# Patient Record
Sex: Female | Born: 1940 | Race: White | Hispanic: No | Marital: Married | Smoking: Former smoker
Health system: Southern US, Community
[De-identification: ages and names within clinical notes are randomized; demographics above are authoritative.]

## PROBLEM LIST (undated history)

## (undated) DIAGNOSIS — E785 Hyperlipidemia, unspecified: Secondary | ICD-10-CM

## (undated) DIAGNOSIS — Z90711 Acquired absence of uterus with remaining cervical stump: Secondary | ICD-10-CM

## (undated) DIAGNOSIS — E876 Hypokalemia: Secondary | ICD-10-CM

## (undated) DIAGNOSIS — K219 Gastro-esophageal reflux disease without esophagitis: Secondary | ICD-10-CM

## (undated) DIAGNOSIS — C801 Malignant (primary) neoplasm, unspecified: Secondary | ICD-10-CM

## (undated) DIAGNOSIS — N052 Unspecified nephritic syndrome with diffuse membranous glomerulonephritis: Secondary | ICD-10-CM

## (undated) DIAGNOSIS — Z973 Presence of spectacles and contact lenses: Secondary | ICD-10-CM

## (undated) DIAGNOSIS — N2581 Secondary hyperparathyroidism of renal origin: Secondary | ICD-10-CM

## (undated) DIAGNOSIS — N186 End stage renal disease: Secondary | ICD-10-CM

## (undated) DIAGNOSIS — E039 Hypothyroidism, unspecified: Secondary | ICD-10-CM

## (undated) DIAGNOSIS — D631 Anemia in chronic kidney disease: Secondary | ICD-10-CM

## (undated) DIAGNOSIS — J302 Other seasonal allergic rhinitis: Secondary | ICD-10-CM

## (undated) DIAGNOSIS — E875 Hyperkalemia: Secondary | ICD-10-CM

## (undated) DIAGNOSIS — R0600 Dyspnea, unspecified: Secondary | ICD-10-CM

## (undated) DIAGNOSIS — R809 Proteinuria, unspecified: Secondary | ICD-10-CM

## (undated) DIAGNOSIS — N189 Chronic kidney disease, unspecified: Secondary | ICD-10-CM

## (undated) DIAGNOSIS — M1990 Unspecified osteoarthritis, unspecified site: Secondary | ICD-10-CM

## (undated) DIAGNOSIS — E209 Hypoparathyroidism, unspecified: Secondary | ICD-10-CM

## (undated) DIAGNOSIS — I10 Essential (primary) hypertension: Secondary | ICD-10-CM

## (undated) DIAGNOSIS — R011 Cardiac murmur, unspecified: Secondary | ICD-10-CM

## (undated) HISTORY — PX: COLONOSCOPY: SHX174

## (undated) HISTORY — DX: Hypothyroidism, unspecified: E03.9

## (undated) HISTORY — PX: OTHER SURGICAL HISTORY: SHX169

## (undated) HISTORY — DX: Essential (primary) hypertension: I10

## (undated) HISTORY — DX: Secondary hyperparathyroidism of renal origin: N25.81

## (undated) HISTORY — DX: Chronic kidney disease, unspecified: N18.9

## (undated) HISTORY — DX: Hyperkalemia: E87.5

## (undated) HISTORY — DX: Proteinuria, unspecified: R80.9

## (undated) HISTORY — DX: Hyperlipidemia, unspecified: E78.5

## (undated) HISTORY — PX: SKIN BIOPSY: SHX1

## (undated) HISTORY — PX: ABDOMINAL HYSTERECTOMY: SHX81

## (undated) HISTORY — DX: Unspecified nephritic syndrome with diffuse membranous glomerulonephritis: N05.2

## (undated) HISTORY — DX: Other disorders of phosphorus metabolism: E83.39

## (undated) HISTORY — DX: Hypokalemia: E87.6

## (undated) HISTORY — DX: Hypoparathyroidism, unspecified: E20.9

## (undated) HISTORY — DX: Anemia in chronic kidney disease: D63.1

## (undated) HISTORY — DX: Acquired absence of uterus with remaining cervical stump: Z90.711

---

## 2000-07-02 ENCOUNTER — Inpatient Hospital Stay (HOSPITAL_COMMUNITY): Admission: AD | Admit: 2000-07-02 | Discharge: 2000-07-05 | Payer: Self-pay | Admitting: Nephrology

## 2000-07-02 ENCOUNTER — Encounter: Payer: Self-pay | Admitting: Nephrology

## 2000-07-04 ENCOUNTER — Encounter: Payer: Self-pay | Admitting: Nephrology

## 2000-08-15 ENCOUNTER — Ambulatory Visit (HOSPITAL_COMMUNITY): Admission: RE | Admit: 2000-08-15 | Discharge: 2000-08-15 | Payer: Self-pay | Admitting: Nephrology

## 2000-08-15 ENCOUNTER — Encounter: Payer: Self-pay | Admitting: Nephrology

## 2004-11-23 ENCOUNTER — Ambulatory Visit: Payer: Self-pay | Admitting: Family Medicine

## 2005-04-02 ENCOUNTER — Ambulatory Visit: Payer: Self-pay | Admitting: Family Medicine

## 2005-07-20 ENCOUNTER — Ambulatory Visit: Payer: Self-pay | Admitting: Family Medicine

## 2012-01-30 ENCOUNTER — Other Ambulatory Visit: Payer: Self-pay | Admitting: Nephrology

## 2012-01-30 ENCOUNTER — Ambulatory Visit
Admission: RE | Admit: 2012-01-30 | Discharge: 2012-01-30 | Disposition: A | Discharge status: Home / Self Care | Payer: Medicare Other | Source: Ambulatory Visit | Attending: Nephrology | Admitting: Nephrology

## 2012-01-30 DIAGNOSIS — R0602 Shortness of breath: Secondary | ICD-10-CM

## 2012-01-30 DIAGNOSIS — R05 Cough: Secondary | ICD-10-CM

## 2016-06-06 ENCOUNTER — Other Ambulatory Visit (HOSPITAL_COMMUNITY): Payer: Self-pay | Admitting: *Deleted

## 2016-06-07 ENCOUNTER — Inpatient Hospital Stay (HOSPITAL_COMMUNITY): Admission: RE | Admit: 2016-06-07 | Payer: Medicare Other | Source: Ambulatory Visit

## 2016-08-27 ENCOUNTER — Inpatient Hospital Stay (HOSPITAL_COMMUNITY): Admission: RE | Admit: 2016-08-27 | Payer: Medicare Other | Source: Ambulatory Visit

## 2016-11-26 DIAGNOSIS — Z85828 Personal history of other malignant neoplasm of skin: Secondary | ICD-10-CM | POA: Insufficient documentation

## 2016-11-26 DIAGNOSIS — I120 Hypertensive chronic kidney disease with stage 5 chronic kidney disease or end stage renal disease: Secondary | ICD-10-CM | POA: Insufficient documentation

## 2017-06-21 ENCOUNTER — Other Ambulatory Visit: Payer: Self-pay

## 2017-06-21 DIAGNOSIS — Z01810 Encounter for preprocedural cardiovascular examination: Secondary | ICD-10-CM

## 2017-06-21 DIAGNOSIS — N185 Chronic kidney disease, stage 5: Secondary | ICD-10-CM

## 2017-06-24 ENCOUNTER — Encounter: Payer: Self-pay | Admitting: Vascular Surgery

## 2017-08-01 ENCOUNTER — Other Ambulatory Visit: Payer: Self-pay | Admitting: *Deleted

## 2017-08-01 ENCOUNTER — Ambulatory Visit (HOSPITAL_COMMUNITY)
Admission: RE | Admit: 2017-08-01 | Discharge: 2017-08-01 | Disposition: A | Discharge status: Home / Self Care | Payer: Medicare Other | Source: Ambulatory Visit | Attending: Vascular Surgery | Admitting: Vascular Surgery

## 2017-08-01 ENCOUNTER — Ambulatory Visit (INDEPENDENT_AMBULATORY_CARE_PROVIDER_SITE_OTHER): Payer: Medicare Other | Admitting: Vascular Surgery

## 2017-08-01 ENCOUNTER — Encounter: Payer: Self-pay | Admitting: *Deleted

## 2017-08-01 ENCOUNTER — Ambulatory Visit (INDEPENDENT_AMBULATORY_CARE_PROVIDER_SITE_OTHER)
Admission: RE | Admit: 2017-08-01 | Discharge: 2017-08-01 | Disposition: A | Discharge status: Home / Self Care | Payer: Medicare Other | Source: Ambulatory Visit | Attending: Vascular Surgery | Admitting: Vascular Surgery

## 2017-08-01 ENCOUNTER — Encounter: Payer: Self-pay | Admitting: Vascular Surgery

## 2017-08-01 VITALS — BP 179/92 | HR 97 | Temp 98.3°F | Resp 18 | Ht 69.0 in | Wt 143.0 lb

## 2017-08-01 DIAGNOSIS — N184 Chronic kidney disease, stage 4 (severe): Secondary | ICD-10-CM

## 2017-08-01 DIAGNOSIS — N185 Chronic kidney disease, stage 5: Secondary | ICD-10-CM

## 2017-08-01 DIAGNOSIS — Z01810 Encounter for preprocedural cardiovascular examination: Secondary | ICD-10-CM | POA: Insufficient documentation

## 2017-08-01 NOTE — Progress Notes (Signed)
[facility] [place] HISTORY AND PHYSICAL  Referring: Dr. Mercy Moore History of Present Illness:  Patient is a 76 y.o. female who presents for placement of a permanent hemodialysis access. The patient is ambidextrous.  The patient is not currently on hemodialysis.  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  Other chronic medical problems include anemia, hyperlipidemia, hypertension, secondary hyperparathyroidism all of which have been stable.  Past Medical History:  Diagnosis Date  . Anemia of renal disease   . Chronic kidney disease    Stage IV  . Hyperkalemia   . Hyperlipidemia   . Hyperphosphatemia   . Hypertension    Essential with goal blood pressure less than 130/80  . Hypokalemia   . Hypoparathyroidism ([place])   . Hypothyroidism   . Membranous glomerulonephritis   . Proteinuria   . S/P partial hysterectomy   . Secondary hyperparathyroidism, renal [facility])     Past Surgical History:  Procedure Laterality Date  . ABDOMINAL HYSTERECTOMY     PARTIAL  . SKIN BIOPSY      SKIN CANCER 6 AREAS  . [place]       Social History Social History  Substance Use Topics  . Smoking status: Former Research scientist (life sciences)  . Smokeless tobacco: Never Used  . Alcohol use No    Family History Family History  Problem Relation Age of Onset  . Stroke Mother   . Heart attack Father     Allergies  No Known Allergies   Current Outpatient Prescriptions  Medication Sig Dispense Refill  . acetaminophen (TYLENOL) 325 MG tablet Take 650 mg by mouth every 6 (six) hours as needed (for pain.).     Marland Kitchen amLODipine (NORVASC) 10 MG tablet Take 10 mg by mouth daily with breakfast.     . atorvastatin (LIPITOR) 80 MG tablet Take 80 mg by mouth daily with breakfast.     . calcitRIOL (ROCALTROL) 0.25 MCG capsule Take 0.5 mcg by mouth daily with breakfast.     . Darbepoetin Alfa (ARANESP) 200 MCG/0.4ML SOSY injection Inject  200 mcg into the skin every 14 (fourteen) days.     . furosemide (LASIX) 80 MG tablet Take 80 mg by mouth daily before breakfast.     . levothyroxine (SYNTHROID, LEVOTHROID) 75 MCG tablet Take 75 mcg by mouth daily before breakfast.    . loratadine (CLARITIN) 10 MG tablet Take 10 mg by mouth daily with breakfast.     . omeprazole (PRILOSEC) 20 MG capsule Take 20 mg by mouth daily before breakfast.     . raloxifene (EVISTA) 60 MG tablet Take 60 mg by mouth daily with breakfast.     . sodium bicarbonate 650 MG tablet Take 650 mg by mouth 3 (three) times daily.     . carvedilol (COREG) 6.25 MG tablet Take 6.25 mg by mouth 2 (two) times daily.     No current facility-administered medications for this visit.     ROS:   General:  No weight loss, Fever, chills  HEENT: No recent headaches, no nasal bleeding, no visual changes, no sore throat  Neurologic: No dizziness, blackouts, seizures. No recent symptoms of stroke or mini- stroke. No recent episodes of slurred speech, or temporary blindness.  Cardiac: No recent episodes of chest pain/pressure, no shortness of breath at rest.  No shortness of breath with exertion.  Denies history of atrial fibrillation or irregular heartbeat  Vascular: No history of rest pain in feet.  No history of claudication.  No history of non-healing ulcer, No history of DVT   Pulmonary: No home oxygen, no productive cough, no hemoptysis,  No asthma or wheezing  Musculoskeletal:  [ ]  Arthritis, [ ]  Low back pain,  [ ]  Joint pain  Hematologic:No history of hypercoagulable state.  No history of easy bleeding.  No history of anemia  Gastrointestinal: No hematochezia or melena,  No gastroesophageal reflux, no trouble swallowing  Urinary: [x ] chronic Kidney disease, [ ]  on HD - [ ]  MWF or [ ]  TTHS, [ ]  Burning with urination, [ ]  Frequent urination, [ ]  Difficulty urinating;   Skin: No rashes  Psychological: No history of anxiety,  No history of  depression   Physical Examination  Vitals:   08/01/17 0847  BP: (!) 179/92  Pulse: 97  Resp: 18  Temp: 98.3 F (36.8 C)  SpO2: 100%  Weight: 143 lb (64.9 kg)  Height: 5\' 9"  (1.753 m)    Body mass index is 21.12 kg/m.  General:  Alert and oriented, no acute distress HEENT: Normal Neck: No bruit or JVD Pulmonary: Clear to auscultation bilaterally Cardiac: Regular Rate and Rhythm without murmur Skin: No rash Extremity Pulses:  2+ radial, brachial pulses bilaterally Musculoskeletal: No deformity or edema  Neurologic: Upper and lower extremity motor 5/5 and symmetric  DATA: She had a vein mapping ultrasound today. Her veins were not a very high quality. She did have a greater than 2 mm cephalic vein in the right upper arm. Otherwise the cephalic vein in the left and right arm was not usable. The basilic vein was also small less than 2 mm on the right 2-3 mm on the left  I reviewed and interpreted this study.   ASSESSMENT: Patient needs long-term hemodialysis access. She has a small vein in the right arm but this may be adequate for fistula creation. I also discussed with her that this may not mature. If the fistula does not mature we would defer placing an AV graft until she approached near dialysis. Other risks benefits and possible complications of the procedure including not limited to bleeding infection non-maturation of the fistula ischemic steal were discussed the patient her daughter and her husband today. They understand and agree to proceed. We have scheduled her for a right brachiocephalic AV fistula placement on Tuesday, 08/06/2017.  PLAN:  See above  Ruta Hinds, MD [facility] [place] Office: [phone] Pager: [phone]

## 2017-08-05 ENCOUNTER — Encounter (HOSPITAL_COMMUNITY): Payer: Self-pay | Admitting: *Deleted

## 2017-08-05 MED ORDER — CEFUROXIME SODIUM 1.5 G IV SOLR
1.5000 g | INTRAVENOUS | Status: AC
  Administered 2017-08-06: 1.5 g via INTRAVENOUS
  Filled 2017-08-05: qty 1.5

## 2017-08-05 NOTE — Progress Notes (Signed)
Pt denies SOB, chest pain, and being under the care of a cardiologist. Pt denies having a stress test, echo and cardiac cath. Pt denies having a chest x ray and EKG within the last year. Pt made aware to stop taking vitamins, fish oil and herbal medications. Do not take any NSAIDs ie: Ibuprofen, Advil, Naproxen (Aleve), Motrin, BC and Goody Powder the patient verbalized understanding of all pre-op instructions.

## 2017-08-06 ENCOUNTER — Ambulatory Visit (HOSPITAL_COMMUNITY): Payer: Medicare Other | Admitting: Anesthesiology

## 2017-08-06 ENCOUNTER — Ambulatory Visit (HOSPITAL_COMMUNITY)
Admission: RE | Admit: 2017-08-06 | Discharge: 2017-08-06 | Disposition: A | Discharge status: Home / Self Care | Payer: Medicare Other | Source: Ambulatory Visit | Attending: Vascular Surgery | Admitting: Vascular Surgery

## 2017-08-06 ENCOUNTER — Encounter (HOSPITAL_COMMUNITY)
Admission: RE | Disposition: A | Discharge status: Home / Self Care | Payer: Self-pay | Source: Ambulatory Visit | Attending: Vascular Surgery

## 2017-08-06 ENCOUNTER — Telehealth: Payer: Self-pay | Admitting: Vascular Surgery

## 2017-08-06 ENCOUNTER — Encounter (HOSPITAL_COMMUNITY): Payer: Self-pay | Admitting: Anesthesiology

## 2017-08-06 DIAGNOSIS — N2581 Secondary hyperparathyroidism of renal origin: Secondary | ICD-10-CM | POA: Insufficient documentation

## 2017-08-06 DIAGNOSIS — Z87891 Personal history of nicotine dependence: Secondary | ICD-10-CM | POA: Insufficient documentation

## 2017-08-06 DIAGNOSIS — N184 Chronic kidney disease, stage 4 (severe): Secondary | ICD-10-CM | POA: Diagnosis not present

## 2017-08-06 DIAGNOSIS — D631 Anemia in chronic kidney disease: Secondary | ICD-10-CM | POA: Diagnosis not present

## 2017-08-06 DIAGNOSIS — N185 Chronic kidney disease, stage 5: Secondary | ICD-10-CM

## 2017-08-06 DIAGNOSIS — Z85828 Personal history of other malignant neoplasm of skin: Secondary | ICD-10-CM | POA: Insufficient documentation

## 2017-08-06 DIAGNOSIS — E039 Hypothyroidism, unspecified: Secondary | ICD-10-CM | POA: Diagnosis not present

## 2017-08-06 DIAGNOSIS — Z79899 Other long term (current) drug therapy: Secondary | ICD-10-CM | POA: Insufficient documentation

## 2017-08-06 DIAGNOSIS — D649 Anemia, unspecified: Secondary | ICD-10-CM | POA: Insufficient documentation

## 2017-08-06 DIAGNOSIS — I129 Hypertensive chronic kidney disease with stage 1 through stage 4 chronic kidney disease, or unspecified chronic kidney disease: Secondary | ICD-10-CM | POA: Diagnosis not present

## 2017-08-06 DIAGNOSIS — E785 Hyperlipidemia, unspecified: Secondary | ICD-10-CM | POA: Diagnosis not present

## 2017-08-06 DIAGNOSIS — Z90710 Acquired absence of both cervix and uterus: Secondary | ICD-10-CM | POA: Insufficient documentation

## 2017-08-06 DIAGNOSIS — K219 Gastro-esophageal reflux disease without esophagitis: Secondary | ICD-10-CM | POA: Diagnosis not present

## 2017-08-06 HISTORY — DX: Other seasonal allergic rhinitis: J30.2

## 2017-08-06 HISTORY — DX: Malignant (primary) neoplasm, unspecified: C80.1

## 2017-08-06 HISTORY — PX: AV FISTULA PLACEMENT: SHX1204

## 2017-08-06 HISTORY — DX: Gastro-esophageal reflux disease without esophagitis: K21.9

## 2017-08-06 LAB — POCT I-STAT 4, (NA,K, GLUC, HGB,HCT)
GLUCOSE: 106 mg/dL — AB (ref 65–99)
HEMATOCRIT: 23 % — AB (ref 36.0–46.0)
HEMOGLOBIN: 7.8 g/dL — AB (ref 12.0–15.0)
Potassium: 3.6 mmol/L (ref 3.5–5.1)
Sodium: 144 mmol/L (ref 135–145)

## 2017-08-06 SURGERY — ARTERIOVENOUS (AV) FISTULA CREATION
Anesthesia: Monitor Anesthesia Care | Site: Arm Upper | Laterality: Right

## 2017-08-06 MED ORDER — 0.9 % SODIUM CHLORIDE (POUR BTL) OPTIME
TOPICAL | Status: DC | PRN
  Administered 2017-08-06: 1000 mL

## 2017-08-06 MED ORDER — CHLORHEXIDINE GLUCONATE 4 % EX LIQD: 60.0000 mL | Freq: Once | CUTANEOUS | Status: DC

## 2017-08-06 MED ORDER — SODIUM CHLORIDE 0.9 % IV SOLN
INTRAVENOUS | Status: DC | PRN
  Administered 2017-08-06: 08:00:00 500 mL

## 2017-08-06 MED ORDER — FENTANYL CITRATE (PF) 250 MCG/5ML IJ SOLN
INTRAMUSCULAR | Status: AC
Start: 2017-08-06 — End: 2017-08-06
  Filled 2017-08-06: qty 5

## 2017-08-06 MED ORDER — SODIUM CHLORIDE 0.9 % IV SOLN
INTRAVENOUS | Status: DC | PRN
  Administered 2017-08-06: 07:00:00 via INTRAVENOUS

## 2017-08-06 MED ORDER — PROPOFOL 500 MG/50ML IV EMUL
INTRAVENOUS | Status: DC | PRN
  Administered 2017-08-06: 75 ug/kg/min via INTRAVENOUS

## 2017-08-06 MED ORDER — HEPARIN SODIUM (PORCINE) 1000 UNIT/ML IJ SOLN
INTRAMUSCULAR | Status: DC | PRN
  Administered 2017-08-06: 3000 [IU] via INTRAVENOUS

## 2017-08-06 MED ORDER — OXYCODONE HCL 5 MG PO TABS: 5.0000 mg | ORAL_TABLET | Freq: Four times a day (QID) | ORAL | 0 refills | Status: DC | PRN

## 2017-08-06 MED ORDER — MIDAZOLAM HCL 2 MG/2ML IJ SOLN
INTRAMUSCULAR | Status: AC
  Filled 2017-08-06: qty 2

## 2017-08-06 MED ORDER — PROPOFOL 10 MG/ML IV BOLUS
INTRAVENOUS | Status: AC
  Filled 2017-08-06: qty 20

## 2017-08-06 MED ORDER — ONDANSETRON HCL 4 MG/2ML IJ SOLN
INTRAMUSCULAR | Status: DC | PRN
  Administered 2017-08-06: 4 mg via INTRAVENOUS

## 2017-08-06 MED ORDER — LIDOCAINE HCL (PF) 1 % IJ SOLN
INTRAMUSCULAR | Status: DC | PRN
  Administered 2017-08-06: 15 mL

## 2017-08-06 MED ORDER — FENTANYL CITRATE (PF) 100 MCG/2ML IJ SOLN
INTRAMUSCULAR | Status: DC | PRN
  Administered 2017-08-06: 50 ug via INTRAVENOUS

## 2017-08-06 MED ORDER — PHENYLEPHRINE HCL 10 MG/ML IJ SOLN
INTRAMUSCULAR | Status: DC | PRN
  Administered 2017-08-06: 20 ug/min via INTRAVENOUS

## 2017-08-06 SURGICAL SUPPLY — 32 items
ADH SKN CLS APL DERMABOND .7 (GAUZE/BANDAGES/DRESSINGS) ×1
ARMBAND PINK RESTRICT EXTREMIT (MISCELLANEOUS) ×4 IMPLANT
CANISTER SUCT 3000ML PPV (MISCELLANEOUS) ×2 IMPLANT
CANNULA VESSEL 3MM 2 BLNT TIP (CANNULA) ×2 IMPLANT
CLIP VESOCCLUDE MED 6/CT (CLIP) ×2 IMPLANT
CLIP VESOCCLUDE SM WIDE 6/CT (CLIP) ×2 IMPLANT
COVER PROBE W GEL 5X96 (DRAPES) IMPLANT
DECANTER SPIKE VIAL GLASS SM (MISCELLANEOUS) ×2 IMPLANT
DERMABOND ADVANCED (GAUZE/BANDAGES/DRESSINGS) ×1
DERMABOND ADVANCED .7 DNX12 (GAUZE/BANDAGES/DRESSINGS) ×1 IMPLANT
DRAIN PENROSE 1/4X12 LTX STRL (WOUND CARE) ×1 IMPLANT
ELECT REM PT RETURN 9FT ADLT (ELECTROSURGICAL) ×2
ELECTRODE REM PT RTRN 9FT ADLT (ELECTROSURGICAL) ×1 IMPLANT
GLOVE BIO SURGEON STRL SZ 6.5 (GLOVE) ×4 IMPLANT
GLOVE BIO SURGEON STRL SZ7.5 (GLOVE) ×2 IMPLANT
GOWN STRL REUS W/ TWL LRG LVL3 (GOWN DISPOSABLE) ×3 IMPLANT
GOWN STRL REUS W/TWL LRG LVL3 (GOWN DISPOSABLE) ×6
KIT BASIN OR (CUSTOM PROCEDURE TRAY) ×2 IMPLANT
KIT ROOM TURNOVER OR (KITS) ×2 IMPLANT
LOOP VESSEL MINI RED (MISCELLANEOUS) ×1 IMPLANT
NDL HYPO 25GX1X1/2 BEV (NEEDLE) IMPLANT
NEEDLE HYPO 25GX1X1/2 BEV (NEEDLE) ×2 IMPLANT
NS IRRIG 1000ML POUR BTL (IV SOLUTION) ×2 IMPLANT
PACK CV ACCESS (CUSTOM PROCEDURE TRAY) ×2 IMPLANT
PAD ARMBOARD 7.5X6 YLW CONV (MISCELLANEOUS) ×4 IMPLANT
SPONGE SURGIFOAM ABS GEL 100 (HEMOSTASIS) IMPLANT
SUT PROLENE 7 0 BV 1 (SUTURE) ×2 IMPLANT
SUT VIC AB 3-0 SH 27 (SUTURE) ×2
SUT VIC AB 3-0 SH 27X BRD (SUTURE) ×1 IMPLANT
SUT VICRYL 4-0 PS2 18IN ABS (SUTURE) ×2 IMPLANT
UNDERPAD 30X30 (UNDERPADS AND DIAPERS) ×2 IMPLANT
WATER STERILE IRR 1000ML POUR (IV SOLUTION) ×2 IMPLANT

## 2017-08-06 NOTE — Telephone Encounter (Signed)
-----   Message from Mena Goes, RN sent at 08/06/2017  9:34 AM EDT ----- Regarding: 4 weeks w/ duplex   ----- Message ----- From: Gabriel Earing, PA-C Sent: 08/06/2017   8:48 AM To: Vvs Charge Pool  S/p right BC AVF.  F/u with Dr. Oneida Alar in 4 weeks with duplex.  Thanks!

## 2017-08-06 NOTE — Op Note (Signed)
Procedure: Right Brachial Cephalic AV fistula  Preop: ESRD  Postop: ESRD  Anesthesia: Local with sedation  Assistant: Leontine Locket PA-C  Findings: 2.5 mm cephalic vein  Procedure: After obtaining informed consent, the patient was taken to the operating room.  After induction of general anesthesia, the left upper extremity was prepped and draped in usual sterile fashion.  A transverse incision was then made near the antecubital crease the left arm. The incision was carried into the subcutaneous tissues down to level of the cephalic vein. The cephalic vein was approximately 2.5 mm in diameter. It was of good quality. This was dissected free circumferentially and small side branches ligated and divided between silk ties or clips. Next the brachial artery was dissected free in the medial portion of the incision. The artery was  3-4 mm in diameter. The vessel loops were placed proximal and distal to the planned site of arteriotomy. The patient was given 3000 units of intravenous heparin. After appropriate circulation time, the vessel loops were used to control the artery. A longitudinal opening was made in the brachial artery.  The vein was ligated distally with a 2-0 silk tie. The vein was controlled proximally with a fine bulldog clamp. The vein was then swung over to the artery and sewn end of vein to side of artery using a running 7-0 Prolene suture. Just prior to completion of the anastomosis, everything was fore bled back bled and thoroughly flushed. The anastomosis was secured, vessel loops released, and there was a palpable thrill in the fistula immediately. After hemostasis was obtained, the subcutaneous tissues were reapproximated using a running 3-0 Vicryl suture. The skin was then closed with a 4 Vicryl subcuticular stitch. Dermabond was applied to the skin incision.  The patient had a palpable radial pulse at the end of the case.  The fistula was slightly pulsatile with good doppler flow to  the level of the shoulder.  Ruta Hinds, MD [facility] [place] Office: [phone] Pager: [phone]

## 2017-08-06 NOTE — Anesthesia Postprocedure Evaluation (Signed)
Anesthesia Post Note  Patient: Latoya Lopez  Procedure(s) Performed: Procedure(s) (LRB): ARTERIOVENOUS (AV) FISTULA CREATION RIGHT ARM (Right)     Patient location during evaluation: PACU Anesthesia Type: MAC Level of consciousness: awake and alert Pain management: pain level controlled Vital Signs Assessment: post-procedure vital signs reviewed and stable Respiratory status: spontaneous breathing, nonlabored ventilation, respiratory function stable and patient connected to nasal cannula oxygen Cardiovascular status: stable and blood pressure returned to baseline Postop Assessment: no apparent nausea or vomiting Anesthetic complications: no    Last Vitals:  Vitals:   08/06/17 1007 08/06/17 1015  BP:  (!) 142/70  Pulse:  96  Resp:  18  Temp:    SpO2: 98% 94%    Last Pain:  Vitals:   08/06/17 1015  TempSrc:   PainSc: 0-No pain                 Dynisha Due,JAMES TERRILL

## 2017-08-06 NOTE — Transfer of Care (Signed)
Immediate Anesthesia Transfer of Care Note  Patient: Latoya Lopez  Procedure(s) Performed: Procedure(s): ARTERIOVENOUS (AV) FISTULA CREATION RIGHT ARM (Right)  Patient Location: PACU  Anesthesia Type:MAC  Level of Consciousness: awake, alert  and oriented  Airway & Oxygen Therapy: Patient Spontanous Breathing and Patient connected to nasal cannula oxygen  Post-op Assessment: Report given to RN, Post -op Vital signs reviewed and stable and Patient moving all extremities X 4  Post vital signs: Reviewed and stable  Last Vitals:  Vitals:   08/06/17 0605 08/06/17 0859  BP: (!) 154/65   Pulse: 95   Resp: 18   Temp: 36.8 C (!) (P) 36.2 C  SpO2: 95%     Last Pain:  Vitals:   08/06/17 0640  TempSrc:   PainSc: 2       Patients Stated Pain Goal: 2 (13/88/71 9597)  Complications: No apparent anesthesia complications

## 2017-08-06 NOTE — Interval H&P Note (Signed)
History and Physical Interval Note:  08/06/2017 7:28 AM  Latoya Lopez  has presented today for surgery, with the diagnosis of chronic kidney disease stage five  The various methods of treatment have been discussed with the patient and family. After consideration of risks, benefits and other options for treatment, the patient has consented to  Procedure(s): ARTERIOVENOUS (AV) FISTULA CREATION RIGHT ARM (Right) as a surgical intervention .  [name] history has been reviewed, patient examined, no change in status, stable for surgery.  I have reviewed the patient's chart and labs.  Questions were answered to the patient's satisfaction.     Ruta Hinds

## 2017-08-06 NOTE — Anesthesia Procedure Notes (Signed)
Procedure Name: MAC Date/Time: 08/06/2017 7:43 AM Performed by: Neldon Newport Pre-anesthesia Checklist: Timeout performed, Patient being monitored, Suction available, Emergency Drugs available and Patient identified Patient Re-evaluated:Patient Re-evaluated prior to induction Oxygen Delivery Method: Simple face mask

## 2017-08-06 NOTE — Anesthesia Preprocedure Evaluation (Addendum)
Anesthesia Evaluation  Patient identified by MRN, date of birth, ID band Patient awake    Reviewed: Allergy & Precautions, NPO status , Patient's Chart, lab work & pertinent test results  Airway Mallampati: II  TM Distance: <3 FB Neck ROM: Full    Dental  (+) Edentulous Upper, Dental Advidsory Given   Pulmonary former smoker,    breath sounds clear to auscultation       Cardiovascular hypertension, On Medications  Rhythm:Regular Rate:Normal + Systolic murmurs    Neuro/Psych    GI/Hepatic GERD  ,  Endo/Other  Hypothyroidism   Renal/GU Renal InsufficiencyRenal disease     Musculoskeletal   Abdominal   Peds  Hematology  (+) anemia ,   Anesthesia Other Findings   Reproductive/Obstetrics                            Anesthesia Physical Anesthesia Plan  ASA: III  Anesthesia Plan: MAC   Post-op Pain Management:    Induction: Intravenous  PONV Risk Score and Plan: 2 and Ondansetron and Dexamethasone  Airway Management Planned: Natural Airway and Simple Face Mask  Additional Equipment:   Intra-op Plan:   Post-operative Plan:   Informed Consent: I have reviewed the patients History and Physical, chart, labs and discussed the procedure including the risks, benefits and alternatives for the proposed anesthesia with the patient or authorized representative who has indicated his/her understanding and acceptance.   Dental advisory given and Dental Advisory Given  Plan Discussed with: CRNA, Anesthesiologist and Surgeon  Anesthesia Plan Comments:        Anesthesia Quick Evaluation

## 2017-08-06 NOTE — Discharge Instructions (Signed)
° °  [facility]  Discharge Instructions  AV Fistula or Graft Surgery for Dialysis Access  Please refer to the following instructions for your post-procedure care. Your surgeon or physician assistant will discuss any changes with you.  Activity  You may drive the day following your surgery, if you are comfortable and no longer taking prescription pain medication. Resume full activity as the soreness in your incision resolves.  Bathing/Showering  You may shower after you go home. Keep your incision dry for 48 hours. Do not soak in a bathtub, hot tub, or swim until the incision heals completely. You may not shower if you have a hemodialysis catheter.  Incision Care  Clean your incision with mild soap and water after 48 hours. Pat the area dry with a clean towel. You do not need a bandage unless otherwise instructed. Do not apply any ointments or creams to your incision. You may have skin glue on your incision. Do not peel it off. It will come off on its own in about one week. Your arm may swell a bit after surgery. To reduce swelling use pillows to elevate your arm so it is above your heart. Your doctor will tell you if you need to lightly wrap your arm with an ACE bandage.  Diet  Resume your normal diet. There are not special food restrictions following this procedure. In order to heal from your surgery, it is CRITICAL to get adequate nutrition. Your body requires vitamins, minerals, and protein. Vegetables are the best source of vitamins and minerals. Vegetables also provide the perfect balance of protein. Processed food has little nutritional value, so try to avoid this.  Medications  Resume taking all of your medications. If your incision is causing pain, you may take over-the counter pain relievers such as acetaminophen (Tylenol). If you were prescribed a stronger pain medication, please be aware these medications can cause nausea and constipation. Prevent  nausea by taking the medication with a snack or meal. Avoid constipation by drinking plenty of fluids and eating foods with high amount of fiber, such as fruits, vegetables, and grains. Do not take Tylenol if you are taking prescription pain medications.  Follow up Your surgeon may want to see you in the office following your access surgery. If so, this will be arranged at the time of your surgery.  Please call us immediately for any of the following conditions:  Increased pain, redness, drainage (pus) from your incision site Fever of 101 degrees or higher Severe or worsening pain at your incision site Hand pain or numbness.  Reduce your risk of vascular disease:  Stop smoking. If you would like help, call [facility] at 1-800-QUIT-NOW 913-783-4493) or [facility] [place] your cholesterol Maintain a desired weight Control your diabetes Keep your blood pressure down  Dialysis  It will take several weeks to several months for your new dialysis access to be ready for use. Your surgeon will determine when it is OK to use it. Your nephrologist will continue to direct your dialysis. You can continue to use your Permcath until your new access is ready for use.   08/06/2017 Latoya Lopez [id number] 1941-04-07  Surgeon(s): Fields, Jessy Oto, MD  Procedure(s): ARTERIOVENOUS (AV) FISTULA CREATION RIGHT ARM  x Do not stick fistula for 12 weeks    If you have any questions, please call the office at [phone].

## 2017-08-06 NOTE — H&P (View-Only) (Signed)
[facility] [place] HISTORY AND PHYSICAL  Referring: Dr. Mercy Moore History of Present Illness:  Patient is a 76 y.o. female who presents for placement of a permanent hemodialysis access. The patient is ambidextrous.  The patient is not currently on hemodialysis.  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  Other chronic medical problems include anemia, hyperlipidemia, hypertension, secondary hyperparathyroidism all of which have been stable.  Past Medical History:  Diagnosis Date  . Anemia of renal disease   . Chronic kidney disease    Stage IV  . Hyperkalemia   . Hyperlipidemia   . Hyperphosphatemia   . Hypertension    Essential with goal blood pressure less than 130/80  . Hypokalemia   . Hypoparathyroidism ([place])   . Hypothyroidism   . Membranous glomerulonephritis   . Proteinuria   . S/P partial hysterectomy   . Secondary hyperparathyroidism, renal [facility])     Past Surgical History:  Procedure Laterality Date  . ABDOMINAL HYSTERECTOMY     PARTIAL  . SKIN BIOPSY      SKIN CANCER 6 AREAS  . [place]       Social History Social History  Substance Use Topics  . Smoking status: Former Research scientist (life sciences)  . Smokeless tobacco: Never Used  . Alcohol use No    Family History Family History  Problem Relation Age of Onset  . Stroke Mother   . Heart attack Father     Allergies  No Known Allergies   Current Outpatient Prescriptions  Medication Sig Dispense Refill  . acetaminophen (TYLENOL) 325 MG tablet Take 650 mg by mouth every 6 (six) hours as needed (for pain.).     Marland Kitchen amLODipine (NORVASC) 10 MG tablet Take 10 mg by mouth daily with breakfast.     . atorvastatin (LIPITOR) 80 MG tablet Take 80 mg by mouth daily with breakfast.     . calcitRIOL (ROCALTROL) 0.25 MCG capsule Take 0.5 mcg by mouth daily with breakfast.     . Darbepoetin Alfa (ARANESP) 200 MCG/0.4ML SOSY injection Inject  200 mcg into the skin every 14 (fourteen) days.     . furosemide (LASIX) 80 MG tablet Take 80 mg by mouth daily before breakfast.     . levothyroxine (SYNTHROID, LEVOTHROID) 75 MCG tablet Take 75 mcg by mouth daily before breakfast.    . loratadine (CLARITIN) 10 MG tablet Take 10 mg by mouth daily with breakfast.     . omeprazole (PRILOSEC) 20 MG capsule Take 20 mg by mouth daily before breakfast.     . raloxifene (EVISTA) 60 MG tablet Take 60 mg by mouth daily with breakfast.     . sodium bicarbonate 650 MG tablet Take 650 mg by mouth 3 (three) times daily.     . carvedilol (COREG) 6.25 MG tablet Take 6.25 mg by mouth 2 (two) times daily.     No current facility-administered medications for this visit.     ROS:   General:  No weight loss, Fever, chills  HEENT: No recent headaches, no nasal bleeding, no visual changes, no sore throat  Neurologic: No dizziness, blackouts, seizures. No recent symptoms of stroke or mini- stroke. No recent episodes of slurred speech, or temporary blindness.  Cardiac: No recent episodes of chest pain/pressure, no shortness of breath at rest.  No shortness of breath with exertion.  Denies history of atrial fibrillation or irregular heartbeat  Vascular: No history of rest pain in feet.  No history of claudication.  No history of non-healing ulcer, No history of DVT   Pulmonary: No home oxygen, no productive cough, no hemoptysis,  No asthma or wheezing  Musculoskeletal:  [ ]  Arthritis, [ ]  Low back pain,  [ ]  Joint pain  Hematologic:No history of hypercoagulable state.  No history of easy bleeding.  No history of anemia  Gastrointestinal: No hematochezia or melena,  No gastroesophageal reflux, no trouble swallowing  Urinary: [x ] chronic Kidney disease, [ ]  on HD - [ ]  MWF or [ ]  TTHS, [ ]  Burning with urination, [ ]  Frequent urination, [ ]  Difficulty urinating;   Skin: No rashes  Psychological: No history of anxiety,  No history of  depression   Physical Examination  Vitals:   08/01/17 0847  BP: (!) 179/92  Pulse: 97  Resp: 18  Temp: 98.3 F (36.8 C)  SpO2: 100%  Weight: 143 lb (64.9 kg)  Height: 5\' 9"  (1.753 m)    Body mass index is 21.12 kg/m.  General:  Alert and oriented, no acute distress HEENT: Normal Neck: No bruit or JVD Pulmonary: Clear to auscultation bilaterally Cardiac: Regular Rate and Rhythm without murmur Skin: No rash Extremity Pulses:  2+ radial, brachial pulses bilaterally Musculoskeletal: No deformity or edema  Neurologic: Upper and lower extremity motor 5/5 and symmetric  DATA: She had a vein mapping ultrasound today. Her veins were not a very high quality. She did have a greater than 2 mm cephalic vein in the right upper arm. Otherwise the cephalic vein in the left and right arm was not usable. The basilic vein was also small less than 2 mm on the right 2-3 mm on the left  I reviewed and interpreted this study.   ASSESSMENT: Patient needs long-term hemodialysis access. She has a small vein in the right arm but this may be adequate for fistula creation. I also discussed with her that this may not mature. If the fistula does not mature we would defer placing an AV graft until she approached near dialysis. Other risks benefits and possible complications of the procedure including not limited to bleeding infection non-maturation of the fistula ischemic steal were discussed the patient her daughter and her husband today. They understand and agree to proceed. We have scheduled her for a right brachiocephalic AV fistula placement on Tuesday, 08/06/2017.  PLAN:  See above  Ruta Hinds, MD [facility] [place] Office: [phone] Pager: [phone]

## 2017-08-06 NOTE — Telephone Encounter (Signed)
Sched lab 09/02/17 at 4:00 and MD 09/05/17 at 12:30. Lm on hm#.

## 2017-08-07 ENCOUNTER — Encounter (HOSPITAL_COMMUNITY): Payer: Self-pay | Admitting: Vascular Surgery

## 2017-08-14 ENCOUNTER — Other Ambulatory Visit: Payer: Self-pay

## 2017-08-14 DIAGNOSIS — N186 End stage renal disease: Secondary | ICD-10-CM

## 2017-08-14 DIAGNOSIS — Z992 Dependence on renal dialysis: Secondary | ICD-10-CM

## 2017-08-14 DIAGNOSIS — Z48812 Encounter for surgical aftercare following surgery on the circulatory system: Secondary | ICD-10-CM

## 2017-08-16 ENCOUNTER — Inpatient Hospital Stay (HOSPITAL_COMMUNITY): Payer: Medicare Other

## 2017-08-16 ENCOUNTER — Inpatient Hospital Stay (HOSPITAL_COMMUNITY)
Admission: AD | Admit: 2017-08-16 | Discharge: 2017-08-21 | DRG: 264 | Disposition: A | Discharge status: Home / Self Care | Payer: Medicare Other | Source: Other Acute Inpatient Hospital | Attending: Nephrology | Admitting: Nephrology

## 2017-08-16 DIAGNOSIS — I10 Essential (primary) hypertension: Secondary | ICD-10-CM | POA: Diagnosis not present

## 2017-08-16 DIAGNOSIS — I509 Heart failure, unspecified: Secondary | ICD-10-CM

## 2017-08-16 DIAGNOSIS — E039 Hypothyroidism, unspecified: Secondary | ICD-10-CM

## 2017-08-16 DIAGNOSIS — R0602 Shortness of breath: Secondary | ICD-10-CM

## 2017-08-16 DIAGNOSIS — Z87891 Personal history of nicotine dependence: Secondary | ICD-10-CM | POA: Diagnosis not present

## 2017-08-16 DIAGNOSIS — Z992 Dependence on renal dialysis: Secondary | ICD-10-CM

## 2017-08-16 DIAGNOSIS — D631 Anemia in chronic kidney disease: Secondary | ICD-10-CM | POA: Diagnosis present

## 2017-08-16 DIAGNOSIS — J90 Pleural effusion, not elsewhere classified: Secondary | ICD-10-CM | POA: Diagnosis not present

## 2017-08-16 DIAGNOSIS — N170 Acute kidney failure with tubular necrosis: Secondary | ICD-10-CM | POA: Diagnosis not present

## 2017-08-16 DIAGNOSIS — N184 Chronic kidney disease, stage 4 (severe): Secondary | ICD-10-CM

## 2017-08-16 DIAGNOSIS — Z8249 Family history of ischemic heart disease and other diseases of the circulatory system: Secondary | ICD-10-CM | POA: Diagnosis not present

## 2017-08-16 DIAGNOSIS — K219 Gastro-esophageal reflux disease without esophagitis: Secondary | ICD-10-CM | POA: Diagnosis present

## 2017-08-16 DIAGNOSIS — N2581 Secondary hyperparathyroidism of renal origin: Secondary | ICD-10-CM | POA: Diagnosis present

## 2017-08-16 DIAGNOSIS — Z452 Encounter for adjustment and management of vascular access device: Secondary | ICD-10-CM

## 2017-08-16 DIAGNOSIS — Z66 Do not resuscitate: Secondary | ICD-10-CM | POA: Diagnosis present

## 2017-08-16 DIAGNOSIS — N186 End stage renal disease: Secondary | ICD-10-CM

## 2017-08-16 DIAGNOSIS — Z823 Family history of stroke: Secondary | ICD-10-CM | POA: Diagnosis not present

## 2017-08-16 DIAGNOSIS — E8779 Other fluid overload: Secondary | ICD-10-CM | POA: Diagnosis present

## 2017-08-16 DIAGNOSIS — N185 Chronic kidney disease, stage 5: Secondary | ICD-10-CM | POA: Diagnosis not present

## 2017-08-16 DIAGNOSIS — D638 Anemia in other chronic diseases classified elsewhere: Secondary | ICD-10-CM | POA: Diagnosis not present

## 2017-08-16 DIAGNOSIS — I132 Hypertensive heart and chronic kidney disease with heart failure and with stage 5 chronic kidney disease, or end stage renal disease: Secondary | ICD-10-CM | POA: Diagnosis present

## 2017-08-16 DIAGNOSIS — E785 Hyperlipidemia, unspecified: Secondary | ICD-10-CM | POA: Diagnosis present

## 2017-08-16 DIAGNOSIS — N19 Unspecified kidney failure: Secondary | ICD-10-CM | POA: Diagnosis present

## 2017-08-16 LAB — PROTIME-INR
INR: 1.23
Prothrombin Time: 15.4 seconds — ABNORMAL HIGH (ref 11.4–15.2)

## 2017-08-16 LAB — CREATININE, SERUM
Creatinine, Ser: 9.91 mg/dL — ABNORMAL HIGH (ref 0.44–1.00)
GFR, EST AFRICAN AMERICAN: 4 mL/min — AB (ref >60)
GFR, EST NON AFRICAN AMERICAN: 3 mL/min — AB (ref >60)

## 2017-08-16 LAB — CBC
HEMATOCRIT: 25.2 % — AB (ref 36.0–46.0)
Hemoglobin: 7.7 g/dL — ABNORMAL LOW (ref 12.0–15.0)
MCH: 28.7 pg (ref 26.0–34.0)
MCHC: 30.6 g/dL (ref 30.0–36.0)
MCV: 94 fL (ref 78.0–100.0)
PLATELETS: 260 10*3/uL (ref 150–400)
RBC: 2.68 MIL/uL — AB (ref 3.87–5.11)
RDW: 16.2 % — ABNORMAL HIGH (ref 11.5–15.5)
WBC: 7.4 10*3/uL (ref 4.0–10.5)

## 2017-08-16 LAB — TYPE AND SCREEN
ABO/RH(D): O POS
Antibody Screen: NEGATIVE

## 2017-08-16 LAB — ABO/RH: ABO/RH(D): O POS

## 2017-08-16 MED ORDER — ATORVASTATIN CALCIUM 80 MG PO TABS
80.0000 mg | ORAL_TABLET | Freq: Every day | ORAL | Status: DC
  Administered 2017-08-17 – 2017-08-21 (×4): 80 mg via ORAL
  Filled 2017-08-16 (×4): qty 1

## 2017-08-16 MED ORDER — SODIUM CHLORIDE 0.9 % IV SOLN: 250.0000 mL | INTRAVENOUS | Status: DC | PRN

## 2017-08-16 MED ORDER — FUROSEMIDE 10 MG/ML IJ SOLN
80.0000 mg | Freq: Every day | INTRAMUSCULAR | Status: DC
  Administered 2017-08-16: 80 mg via INTRAVENOUS
  Filled 2017-08-16: qty 8

## 2017-08-16 MED ORDER — LORATADINE 10 MG PO TABS
10.0000 mg | ORAL_TABLET | Freq: Every day | ORAL | Status: DC
  Administered 2017-08-17 – 2017-08-21 (×4): 10 mg via ORAL
  Filled 2017-08-16 (×4): qty 1

## 2017-08-16 MED ORDER — DARBEPOETIN ALFA 200 MCG/0.4ML IJ SOSY: 200.0000 ug | PREFILLED_SYRINGE | INTRAMUSCULAR | Status: DC

## 2017-08-16 MED ORDER — AMLODIPINE BESYLATE 10 MG PO TABS
10.0000 mg | ORAL_TABLET | Freq: Every day | ORAL | Status: DC
  Administered 2017-08-17 – 2017-08-21 (×5): 10 mg via ORAL
  Filled 2017-08-16 (×5): qty 1

## 2017-08-16 MED ORDER — SODIUM BICARBONATE 650 MG PO TABS: 650.0000 mg | ORAL_TABLET | Freq: Three times a day (TID) | ORAL | Status: DC

## 2017-08-16 MED ORDER — CARVEDILOL 6.25 MG PO TABS
6.2500 mg | ORAL_TABLET | Freq: Two times a day (BID) | ORAL | Status: DC
  Administered 2017-08-17 – 2017-08-21 (×9): 6.25 mg via ORAL
  Filled 2017-08-16 (×4): qty 1
  Filled 2017-08-16: qty 2
  Filled 2017-08-16 (×4): qty 1

## 2017-08-16 MED ORDER — CALCITRIOL 0.5 MCG PO CAPS: 0.5000 ug | ORAL_CAPSULE | Freq: Every day | ORAL | Status: DC

## 2017-08-16 MED ORDER — LEVOTHYROXINE SODIUM 75 MCG PO TABS
75.0000 ug | ORAL_TABLET | Freq: Every day | ORAL | Status: DC
  Administered 2017-08-17 – 2017-08-21 (×5): 75 ug via ORAL
  Filled 2017-08-16 (×6): qty 1

## 2017-08-16 MED ORDER — PANTOPRAZOLE SODIUM 40 MG PO TBEC
40.0000 mg | DELAYED_RELEASE_TABLET | Freq: Every day | ORAL | Status: DC
  Administered 2017-08-17 – 2017-08-21 (×4): 40 mg via ORAL
  Filled 2017-08-16 (×4): qty 1

## 2017-08-16 MED ORDER — SODIUM CHLORIDE 0.9% FLUSH: 3.0000 mL | INTRAVENOUS | Status: DC | PRN

## 2017-08-16 MED ORDER — CALCITRIOL 0.5 MCG PO CAPS
1.0000 ug | ORAL_CAPSULE | ORAL | Status: DC
  Administered 2017-08-17: 1 ug via ORAL
  Filled 2017-08-16: qty 2

## 2017-08-16 MED ORDER — HEPARIN SODIUM (PORCINE) 5000 UNIT/ML IJ SOLN
5000.0000 [IU] | Freq: Three times a day (TID) | INTRAMUSCULAR | Status: DC
  Administered 2017-08-16 – 2017-08-21 (×9): 5000 [IU] via SUBCUTANEOUS
  Filled 2017-08-16 (×11): qty 1

## 2017-08-16 MED ORDER — SODIUM CHLORIDE 0.9% FLUSH
3.0000 mL | Freq: Two times a day (BID) | INTRAVENOUS | Status: DC
  Administered 2017-08-16 – 2017-08-21 (×7): 3 mL via INTRAVENOUS

## 2017-08-16 NOTE — H&P (Addendum)
History and Physical    Latoya Lopez FBP:[id number] DOB: 1940/12/07 DOA: 08/16/2017  PCP: [place] (Confirm with patient/family/NH records and if not entered, this has to be entered at [facility] point of entry) Patient coming from: home    Chief Complaint: shortness of breath  HPI: Latoya Lopez is a 76 y.o. female with medical history significant of see below presented to [facility] today for every 2 wk epo injection. While there spoke with nursing who sent her down to the ED. Patient states has had sob for one day and one week of progressive LE edema. Able to lie flat at night. Had right brachial av vistula placed 9/25. No recent med changes. No chest pain or palpiations. Does have dry cough. No fevers. Normal appetite. Per report was low 90s in [place] hospital, arrives on 2 L. Normally has 1/4 mile eercise tolerance, currently less than a block.   ED Course: xray, labs, ct scan, azith/ceftriaxone, ekg (normal sinus)  Review of Systems: As per HPI otherwise 10 point review of systems negative.    Past Medical History:  Diagnosis Date  . Anemia of renal disease   . Cancer ([place])    skin  . Chronic kidney disease    Stage IV  . GERD (gastroesophageal reflux disease)   . Hyperkalemia   . Hyperlipidemia   . Hyperphosphatemia   . Hypertension    Essential with goal blood pressure less than 130/80  . Hypokalemia   . Hypoparathyroidism ([place])   . Hypothyroidism   . Membranous glomerulonephritis   . Proteinuria   . S/P partial hysterectomy   . Seasonal allergies   . Secondary hyperparathyroidism, renal [facility])     Past Surgical History:  Procedure Laterality Date  . ABDOMINAL HYSTERECTOMY     PARTIAL  . AV FISTULA PLACEMENT Right 08/06/2017   Procedure: ARTERIOVENOUS (AV) FISTULA CREATION RIGHT ARM;  Surgeon: Elam Dutch, MD;  Location: [facility];  Service: Vascular;  Laterality: Right;  . SKIN BIOPSY      SKIN CANCER 6 AREAS  . TUBUALIGATION        reports that she has quit smoking. Her smoking use included Cigarettes. She has never used smokeless tobacco. She reports that she does not drink alcohol or use drugs.  No Known Allergies  Family History  Problem Relation Age of Onset  . Stroke Mother   . Heart attack Father   . Heart attack Sister      Prior to Admission medications   Medication Sig Start Date End Date Taking? Authorizing Provider  acetaminophen (TYLENOL) 325 MG tablet Take 650 mg by mouth every 6 (six) hours as needed (for pain.).     [provider]  amLODipine (NORVASC) 10 MG tablet Take 10 mg by mouth daily with breakfast.     [provider]  atorvastatin (LIPITOR) 80 MG tablet Take 80 mg by mouth daily with breakfast.     [provider]  calcitRIOL (ROCALTROL) 0.25 MCG capsule Take 0.5 mcg by mouth daily with breakfast.     [provider]  carvedilol (COREG) 6.25 MG tablet Take 6.25 mg by mouth 2 (two) times daily.    [provider]  Darbepoetin Alfa (ARANESP) 200 MCG/0.4ML SOSY injection Inject 200 mcg into the skin every 14 (fourteen) days.     [provider]  furosemide (LASIX) 80 MG tablet Take 80 mg by mouth daily before breakfast.     [provider]  levothyroxine (SYNTHROID, LEVOTHROID) 75 MCG tablet Take 75 mcg by mouth daily before breakfast.    [provider]  loratadine (CLARITIN) 10 MG tablet Take 10 mg by mouth daily with breakfast.     [provider]  omeprazole (PRILOSEC) 20 MG capsule Take 20 mg by mouth daily before breakfast.     [provider]  oxyCODONE (ROXICODONE) 5 MG immediate release tablet Take 1 tablet (5 mg total) by mouth every 6 (six) hours as needed. 08/06/17   Rhyne, Hulen Shouts, PA-C  raloxifene (EVISTA) 60 MG tablet Take 60 mg by mouth daily with breakfast.     [provider]  sodium bicarbonate 650 MG tablet Take 650 mg by mouth 3 (three) times daily.     [provider]    Physical Exam: Vitals:   08/16/17 1819  BP: (!) 154/71  Pulse: (!) 108  Resp: 18  Temp: 98.1 F (36.7 C)  TempSrc: Oral  SpO2: 95%    Constitutional: NAD, calm, comfortable Vitals:   08/16/17 1819  BP: (!) 154/71  Pulse: (!) 108  Resp: 18  Temp: 98.1 F (36.7 C)  TempSrc: Oral  SpO2: 95%   Eyes: PERRL, lids and conjunctivae normal ENMT: Mucous membranes are moist. Missing many teeth Neck: normal, supple, no masses, no thyromegaly Respiratory: decreased breath sounds and rales at bases Cardiovascular: moderate harsh systolic murmur, rrr. Mild pitting LE edema Abdomen: no tenderness, no masses palpated. No hepatosplenomegaly. Bowel sounds positive.  Musculoskeletal: no clubbing / cyanosis. No joint deformity upper and lower extremities.  Skin: no rashes, lesions, ulcers. No induration Neurologic: CN 2-12 grossly intact.  Psychiatric: Normal judgment and insight. Alert and oriented x 3. Normal mood.    Labs on Admission: I have personally reviewed following labs and imaging studies  CBC: No results for input(s): WBC, NEUTROABS, HGB, HCT, MCV, PLT in the last 168 hours. Basic Metabolic Panel: No results for input(s): NA, K, CL, CO2, GLUCOSE, BUN, CREATININE, CALCIUM, MG, PHOS in the last 168 hours. GFR: CrCl cannot be calculated (No order found.). Liver Function Tests: No results for input(s): AST, ALT, ALKPHOS, BILITOT, PROT, ALBUMIN in the last 168 hours. No results for input(s): LIPASE, AMYLASE in the last 168 hours. No results for input(s): AMMONIA in the last 168 hours. Coagulation Profile: No results for input(s): INR, PROTIME in the last 168 hours. Cardiac Enzymes: No results for input(s): CKTOTAL, CKMB, CKMBINDEX, TROPONINI in the last 168 hours. BNP (last 3 results) No results for input(s): PROBNP in the last 8760 hours. HbA1C: No results for input(s): HGBA1C in the last 72 hours. CBG: No results for input(s): GLUCAP in the last 168  hours. Lipid Profile: No results for input(s): CHOL, HDL, LDLCALC, TRIG, CHOLHDL, LDLDIRECT in the last 72 hours. Thyroid Function Tests: No results for input(s): TSH, T4TOTAL, FREET4, T3FREE, THYROIDAB in the last 72 hours. Anemia Panel: No results for input(s): VITAMINB12, FOLATE, FERRITIN, TIBC, IRON, RETICCTPCT in the last 72 hours. Urine analysis: No results found for: COLORURINE, APPEARANCEUR, LABSPEC, [place], GLUCOSEU, HGBUR, BILIRUBINUR, KETONESUR, PROTEINUR, UROBILINOGEN, NITRITE, LEUKOCYTESUR  Review of [facility] ED labs significant for H 8.6, cr 9.6, K 3.8, Na 142, bnp 27,400, troponin negative  Radiological Exams on Admission: No results found.  EKG: Independently reviewed report from [place], normal intervals, nsr  Assessment/Plan Active Problems:   Kidney failure   Acute congestive heart failure (HCC)   Pleural effusion  53F hx ESRD, recent av fistula placed, now presenting with shortness of breath  and lower extremity edema secondary volume overload secondary to end stage renal disease  # ESRD: no significant electrolyte abnormalities. [facility] ED spoke w/ nephrology Dr. Lorrene Reid. Plan for admission, tunneled catheter placement, start of hemodialysis. - will confirm plan w/ nephrology - NPO past midnight, plan for IR consult in AM for tunneled catheter placement - repeat electrolytes in AM  # Lower extremity edema, pulmonary effusion - will repeat CXR. No clear signs of pneumonia so holding on further antibiotics - lasix 80 mg IV qd (home dose 80 po) - dialysis as above - O2 92% at reast but desat with ambulation, so ordering O2 with ambulation  # HTN - continue home amlodipine, volume control w/ hemodialysis  # Hyperparathyroid #Hypothyroid - continue home meds  DVT prophylaxis: Heparin Code Status: dnr/dni Family Communication: husband cecil Miley [phone], daughter Brunilda Payor [phone] Disposition Plan: home vs snf Consults called: Lorrene Reid  nephrology Admission status: tele   Desma Maxim MD [facility] Pager [phone]  If 7PM-7AM, please contact night-coverage [email or web address] Password [email or web address]  08/16/2017, 7:23 PM

## 2017-08-16 NOTE — Consult Note (Signed)
[facility] Consultation Note Requesting Physician:  [facility] Primary Nephrologist: Mercy Moore Reason for Consult:  New ESRD in need of dialysis initiation  HPI: The patient is a 76 y.o. year-old WF from [place]  PMH HLD, hypothyroidism, secondary hyperparathyroidism, anemia on Aranesp. Was followed for many years by Dr. 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 Disease progressed more quickly in the last several months. Last seen at [facility] 07/12/17 with creatinine at that time of 5.5. Had a R BC AVF done by Dr. Oneida Alar 08/06/17.  Was at [facility] [place] today to receive her usual Aranesp injection - mentioned to them that she was having issues with SOB/edema. Was sent to the ED where RA sats were in the high 80's, CXR with pleural effusions, creatinine was 9.6, BUN 77, Hb 8.6, BNP 27400, pO2 62 on room air. Influenza screen negative. She is transferred here for [facility] placement, initiation of HD. Rec'd doses of azithro and rocephin (not sure exactly why) before transfer.  She reports started feeling poorly 2-3 days after her AVF was done, gained 5-7 lb, developed DOE and leg swelling. Some nausea but generally appetite OK. AVF has been sore with some "fever around the incision". No hand numbness or tingling    Past Medical History:  Diagnosis Date  . Anemia of renal disease   . Cancer ([place])    skin  . Chronic kidney disease    Stage IV  . GERD (gastroesophageal reflux disease)   . Hyperkalemia   . Hyperlipidemia   . Hyperphosphatemia   . Hypertension    Essential with goal blood pressure less than 130/80  . Hypokalemia   . Hypoparathyroidism ([place])   . Hypothyroidism   . Membranous glomerulonephritis   . Proteinuria   . S/P partial hysterectomy   . Seasonal allergies   . Secondary hyperparathyroidism, renal [facility])      Past Surgical History:  Procedure Laterality Date  . ABDOMINAL HYSTERECTOMY     PARTIAL  .  AV FISTULA PLACEMENT Right 08/06/2017   Procedure: ARTERIOVENOUS (AV) FISTULA CREATION RIGHT ARM;  Surgeon: Elam Dutch, MD;  Location: [facility];  Service: Vascular;  Laterality: Right;  . SKIN BIOPSY      SKIN CANCER 6 AREAS  . [place]       Family History  Problem Relation Age of Onset  . Stroke Mother   . Heart attack Father   . Heart attack Sister    Social History:  reports that she has quit smoking. Her smoking use included Cigarettes. She has never used smokeless tobacco. She reports that she does not drink alcohol or use drugs.  Allergies: No Known Allergies  Home medications: Prior to Admission medications   Medication Sig Start Date End Date Taking? Authorizing Provider  amLODipine (NORVASC) 10 MG tablet Take 10 mg by mouth daily with breakfast.     [provider]  atorvastatin (LIPITOR) 80 MG tablet Take 80 mg by mouth daily with breakfast.     [provider]  calcitRIOL (ROCALTROL) 0.25 MCG capsule Take 0.5 mcg by mouth daily with breakfast.     [provider]  carvedilol (COREG) 6.25 MG tablet Take 6.25 mg by mouth 2 (two) times daily.    [provider]  Darbepoetin Alfa (ARANESP) 200 MCG/0.4ML SOSY injection Inject 200 mcg into the skin every 14 (fourteen) days.     [provider]  furosemide (LASIX) 80 MG tablet Take  80 mg by mouth daily before breakfast.     [provider]  levothyroxine (SYNTHROID, LEVOTHROID) 75 MCG tablet Take 75 mcg by mouth daily before breakfast.    [provider]  loratadine (CLARITIN) 10 MG tablet Take 10 mg by mouth daily with breakfast.     [provider]  omeprazole (PRILOSEC) 20 MG capsule Take 20 mg by mouth daily before breakfast.     [provider]  raloxifene (EVISTA) 60 MG tablet Take 60 mg by mouth daily with breakfast.     [provider]  sodium bicarbonate 650 MG tablet Take 650 mg by mouth 3 (three) times daily.     [provider]    Inpatient medications: . [START ON 08/17/2017] amLODipine  10 mg Oral Q breakfast  . [START ON 08/17/2017] atorvastatin  80 mg Oral Q breakfast  . [START ON 08/17/2017] calcitRIOL  0.5 mcg Oral Q breakfast  . carvedilol  6.25 mg Oral BID  . furosemide  80 mg Intravenous Daily  . heparin  5,000 Units Subcutaneous Q8H  . [START ON 08/17/2017] levothyroxine  75 mcg Oral QAC breakfast  . [START ON 08/17/2017] loratadine  10 mg Oral Q breakfast  . pantoprazole  40 mg Oral Daily  . sodium bicarbonate  650 mg Oral TID  . sodium chloride flush  3 mL Intravenous Q12H    Review of Systems See HPI  Physical Exam:  Blood pressure (!) 154/71, pulse (!) 108, temperature 98.1 F (36.7 C), temperature source Oral, resp. rate 18, SpO2 95 %.  Gen: Thin pleasant WF, NAD Edentulous No rash or cyanosis Lungs with bibasilar crackles, no wheezing Tachy around 100 S1S2 No S3 2/6 murmur LSB No diastolic murmur or pericardial rub Abdomen soft, no focal tenderness 1+ pitting edema LE's Neuro: alert, Ox3, no focal deficit Dialysis Access: R BC AVF some erythema (plus old iron staining of skin just above incision). Some ^ warmth. Only very faint bruit  Labs: Pending (see HPI for pertinent [facility] labs)  Xrays/Other Studies: X-ray Chest Pa And Lateral  Result Date: 08/16/2017 CLINICAL DATA:  Shortness of breath. EXAM: CHEST  2 VIEW COMPARISON:  Chest x-ray and CT chest from same date. FINDINGS: The cardiomediastinal silhouette is enlarged. Normal pulmonary vascularity. Atherosclerotic calcification of the aortic arch. Moderate left pleural effusion with adjacent left lower lobe atelectasis. Small layering right pleural effusion. No pneumothorax. No acute osseous abnormality. IMPRESSION: Moderate left and small right pleural effusions with adjacent basilar atelectasis, similar to prior study. Electronically Signed   By: Titus Dubin M.D.   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  Assessment/Recommendations  1. 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 I contacted [facility] earlier today (prior to her arrival) for consult (office indicated they would send consult to Dr. Donnetta Hutching) - and they will see her regarding [facility] placement.  2. Keep NPO after midnight.  3. HD tomorrow after access placement 4. Stop lasix and bicarb when HD starts 5. CLIP 2. Vascular access  1. [facility] plans as above.  2. R AVF with only faint bruit. Ask for opinion on that as well 3. Anemia - On Aranesp as outpt.  1. Check Fe studies. Replete if needed.  2. Continue Aranesp with HD 4. Secondary HPT - PTH 191 06/2017.  1. On  calcitriol 0.5 alt with 0.75 at home.  2. Will change dose to 1 mcg with every HD. 5. HTN - Meds/volume control with HD 6. Hypothyroidism - continue levothyroxine   Jamal Maes,  MD [facility] [phone] pager 08/16/2017, 9:00 PM

## 2017-08-17 ENCOUNTER — Inpatient Hospital Stay (HOSPITAL_COMMUNITY): Payer: Medicare Other | Admitting: Certified Registered Nurse Anesthetist

## 2017-08-17 ENCOUNTER — Encounter (HOSPITAL_COMMUNITY): Payer: Self-pay | Admitting: Anesthesiology

## 2017-08-17 ENCOUNTER — Inpatient Hospital Stay (HOSPITAL_COMMUNITY): Payer: Medicare Other

## 2017-08-17 ENCOUNTER — Encounter (HOSPITAL_COMMUNITY)
Admission: AD | Disposition: A | Discharge status: Home / Self Care | Payer: Self-pay | Source: Other Acute Inpatient Hospital | Attending: Internal Medicine

## 2017-08-17 DIAGNOSIS — E039 Hypothyroidism, unspecified: Secondary | ICD-10-CM

## 2017-08-17 DIAGNOSIS — I10 Essential (primary) hypertension: Secondary | ICD-10-CM

## 2017-08-17 DIAGNOSIS — N186 End stage renal disease: Secondary | ICD-10-CM

## 2017-08-17 HISTORY — PX: INSERTION OF DIALYSIS CATHETER: SHX1324

## 2017-08-17 LAB — COMPREHENSIVE METABOLIC PANEL
ALK PHOS: 51 U/L (ref 38–126)
ALT: 25 U/L (ref 14–54)
AST: 24 U/L (ref 15–41)
Albumin: 2.7 g/dL — ABNORMAL LOW (ref 3.5–5.0)
Anion gap: 16 — ABNORMAL HIGH (ref 5–15)
BILIRUBIN TOTAL: 0.6 mg/dL (ref 0.3–1.2)
BUN: 81 mg/dL — AB (ref 6–20)
CALCIUM: 8.4 mg/dL — AB (ref 8.9–10.3)
CO2: 16 mmol/L — ABNORMAL LOW (ref 22–32)
CREATININE: 9.72 mg/dL — AB (ref 0.44–1.00)
Chloride: 108 mmol/L (ref 101–111)
GFR calc Af Amer: 4 mL/min — ABNORMAL LOW (ref >60)
GFR, EST NON AFRICAN AMERICAN: 3 mL/min — AB (ref >60)
Glucose, Bld: 112 mg/dL — ABNORMAL HIGH (ref 65–99)
Potassium: 3.4 mmol/L — ABNORMAL LOW (ref 3.5–5.1)
Sodium: 140 mmol/L (ref 135–145)
TOTAL PROTEIN: 5.9 g/dL — AB (ref 6.5–8.1)

## 2017-08-17 LAB — CBC
HEMATOCRIT: 25.4 % — AB (ref 36.0–46.0)
Hemoglobin: 7.8 g/dL — ABNORMAL LOW (ref 12.0–15.0)
MCH: 29 pg (ref 26.0–34.0)
MCHC: 30.7 g/dL (ref 30.0–36.0)
MCV: 94.4 fL (ref 78.0–100.0)
PLATELETS: 248 10*3/uL (ref 150–400)
RBC: 2.69 MIL/uL — ABNORMAL LOW (ref 3.87–5.11)
RDW: 16.1 % — AB (ref 11.5–15.5)
WBC: 6.8 10*3/uL (ref 4.0–10.5)

## 2017-08-17 LAB — MAGNESIUM: MAGNESIUM: 1.6 mg/dL — AB (ref 1.7–2.4)

## 2017-08-17 LAB — SURGICAL PCR SCREEN
MRSA, PCR: NEGATIVE
Staphylococcus aureus: NEGATIVE

## 2017-08-17 LAB — IRON AND TIBC
Iron: 43 ug/dL (ref 28–170)
SATURATION RATIOS: 26 % (ref 10.4–31.8)
TIBC: 162 ug/dL — AB (ref 250–450)
UIBC: 119 ug/dL

## 2017-08-17 SURGERY — INSERTION OF DIALYSIS CATHETER
Anesthesia: Monitor Anesthesia Care

## 2017-08-17 MED ORDER — LACTATED RINGERS IV SOLN: INTRAVENOUS | Status: DC

## 2017-08-17 MED ORDER — HYDROMORPHONE HCL 1 MG/ML IJ SOLN: 0.2500 mg | INTRAMUSCULAR | Status: DC | PRN

## 2017-08-17 MED ORDER — LIDOCAINE-EPINEPHRINE 0.5 %-1:200000 IJ SOLN
INTRAMUSCULAR | Status: DC | PRN
  Administered 2017-08-17: 3 mL

## 2017-08-17 MED ORDER — OXYCODONE HCL 5 MG PO TABS: 5.0000 mg | ORAL_TABLET | Freq: Once | ORAL | Status: DC | PRN

## 2017-08-17 MED ORDER — SODIUM CHLORIDE 0.9 % IV SOLN
INTRAVENOUS | Status: DC | PRN
  Administered 2017-08-17: 08:00:00 500 mL

## 2017-08-17 MED ORDER — MEPERIDINE HCL 25 MG/ML IJ SOLN: 6.2500 mg | INTRAMUSCULAR | Status: DC | PRN

## 2017-08-17 MED ORDER — PROPOFOL 10 MG/ML IV BOLUS
INTRAVENOUS | Status: DC | PRN
  Administered 2017-08-17: 15 mg via INTRAVENOUS
  Administered 2017-08-17: 20 mg via INTRAVENOUS

## 2017-08-17 MED ORDER — LIDOCAINE HCL (CARDIAC) 20 MG/ML IV SOLN
INTRAVENOUS | Status: DC | PRN
  Administered 2017-08-17: 60 mg via INTRATRACHEAL

## 2017-08-17 MED ORDER — OXYCODONE HCL 5 MG/5ML PO SOLN: 5.0000 mg | Freq: Once | ORAL | Status: DC | PRN

## 2017-08-17 MED ORDER — PROPOFOL 10 MG/ML IV BOLUS
INTRAVENOUS | Status: AC
  Filled 2017-08-17: qty 20

## 2017-08-17 MED ORDER — FENTANYL CITRATE (PF) 100 MCG/2ML IJ SOLN: 25.0000 ug | INTRAMUSCULAR | Status: DC | PRN

## 2017-08-17 MED ORDER — 0.9 % SODIUM CHLORIDE (POUR BTL) OPTIME
TOPICAL | Status: DC | PRN
  Administered 2017-08-17: 1000 mL

## 2017-08-17 MED ORDER — HEPARIN SODIUM (PORCINE) 1000 UNIT/ML IJ SOLN
INTRAMUSCULAR | Status: DC | PRN
  Administered 2017-08-17: 3400 [IU]

## 2017-08-17 MED ORDER — SODIUM CHLORIDE 0.9 % IV SOLN
INTRAVENOUS | Status: DC | PRN
  Administered 2017-08-17: 09:00:00 via INTRAVENOUS

## 2017-08-17 MED ORDER — ONDANSETRON HCL 4 MG/2ML IJ SOLN
INTRAMUSCULAR | Status: DC | PRN
  Administered 2017-08-17: 4 mg via INTRAVENOUS

## 2017-08-17 MED ORDER — DEXTROSE 5 % IV SOLN
1.5000 g | INTRAVENOUS | Status: AC
  Administered 2017-08-17: 1.5 g via INTRAVENOUS
  Filled 2017-08-17: qty 1.5

## 2017-08-17 MED ORDER — SODIUM CHLORIDE 0.9 % IV SOLN: INTRAVENOUS | Status: DC

## 2017-08-17 MED ORDER — HEPARIN SODIUM (PORCINE) 1000 UNIT/ML IJ SOLN
INTRAMUSCULAR | Status: AC
  Filled 2017-08-17: qty 1

## 2017-08-17 MED ORDER — LIDOCAINE-EPINEPHRINE 0.5 %-1:200000 IJ SOLN
INTRAMUSCULAR | Status: AC
Start: 2017-08-17 — End: 2017-08-17
  Filled 2017-08-17: qty 1

## 2017-08-17 MED ORDER — PROMETHAZINE HCL 25 MG/ML IJ SOLN: 6.2500 mg | INTRAMUSCULAR | Status: DC | PRN

## 2017-08-17 MED ORDER — PHENYLEPHRINE HCL 10 MG/ML IJ SOLN
INTRAMUSCULAR | Status: DC | PRN
  Administered 2017-08-17: 80 ug via INTRAVENOUS
  Administered 2017-08-17: 130 ug via INTRAVENOUS
  Administered 2017-08-17 (×2): 80 ug via INTRAVENOUS

## 2017-08-17 MED ORDER — PROPOFOL 500 MG/50ML IV EMUL
INTRAVENOUS | Status: DC | PRN
  Administered 2017-08-17: 80 ug/kg/min via INTRAVENOUS

## 2017-08-17 SURGICAL SUPPLY — 40 items
ADH SKN CLS APL DERMABOND .7 (GAUZE/BANDAGES/DRESSINGS)
BAG DECANTER FOR FLEXI CONT (MISCELLANEOUS) ×3 IMPLANT
BIOPATCH RED 1 DISK 7.0 (GAUZE/BANDAGES/DRESSINGS) ×2 IMPLANT
BIOPATCH RED 1IN DISK 7.0MM (GAUZE/BANDAGES/DRESSINGS) ×1
CATH PALINDROME RT-P 15FX19CM (CATHETERS) IMPLANT
CATH PALINDROME RT-P 15FX23CM (CATHETERS) ×2 IMPLANT
CATH PALINDROME RT-P 15FX28CM (CATHETERS) IMPLANT
CATH PALINDROME RT-P 15FX55CM (CATHETERS) IMPLANT
COVER PROBE W GEL 5X96 (DRAPES) IMPLANT
COVER SURGICAL LIGHT HANDLE (MISCELLANEOUS) ×3 IMPLANT
DECANTER SPIKE VIAL GLASS SM (MISCELLANEOUS) ×3 IMPLANT
DERMABOND ADVANCED (GAUZE/BANDAGES/DRESSINGS)
DERMABOND ADVANCED .7 DNX12 (GAUZE/BANDAGES/DRESSINGS) IMPLANT
DRAPE C-ARM 42X72 X-RAY (DRAPES) ×3 IMPLANT
DRAPE CHEST BREAST 15X10 FENES (DRAPES) ×3 IMPLANT
DRSG TEGADERM 4X4.75 (GAUZE/BANDAGES/DRESSINGS) ×2 IMPLANT
GLOVE SS BIOGEL STRL SZ 7.5 (GLOVE) ×1 IMPLANT
GLOVE SUPERSENSE BIOGEL SZ 7.5 (GLOVE) ×2
GOWN STRL REUS W/ TWL LRG LVL3 (GOWN DISPOSABLE) ×2 IMPLANT
GOWN STRL REUS W/TWL LRG LVL3 (GOWN DISPOSABLE) ×6
KIT BASIN OR (CUSTOM PROCEDURE TRAY) ×3 IMPLANT
KIT ROOM TURNOVER OR (KITS) ×3 IMPLANT
NDL 18GX1X1/2 (RX/OR ONLY) (NEEDLE) ×1 IMPLANT
NDL HYPO 25GX1X1/2 BEV (NEEDLE) ×1 IMPLANT
NEEDLE 18GX1X1/2 (RX/OR ONLY) (NEEDLE) ×3 IMPLANT
NEEDLE 22X1 1/2 (OR ONLY) (NEEDLE) IMPLANT
NEEDLE HYPO 25GX1X1/2 BEV (NEEDLE) ×3 IMPLANT
NS IRRIG 1000ML POUR BTL (IV SOLUTION) ×3 IMPLANT
PACK SURGICAL SETUP 50X90 (CUSTOM PROCEDURE TRAY) ×3 IMPLANT
PAD ARMBOARD 7.5X6 YLW CONV (MISCELLANEOUS) ×6 IMPLANT
SOAP 2 % CHG 4 OZ (WOUND CARE) ×3 IMPLANT
SUT ETHILON 3 0 PS 1 (SUTURE) ×3 IMPLANT
SUT VICRYL 4-0 PS2 18IN ABS (SUTURE) ×3 IMPLANT
SYR 10ML LL (SYRINGE) ×3 IMPLANT
SYR 20CC LL (SYRINGE) ×3 IMPLANT
SYR 5ML LL (SYRINGE) ×6 IMPLANT
SYR CONTROL 10ML LL (SYRINGE) ×3 IMPLANT
TOWEL GREEN STERILE (TOWEL DISPOSABLE) ×3 IMPLANT
TOWEL GREEN STERILE FF (TOWEL DISPOSABLE) ×3 IMPLANT
WATER STERILE IRR 1000ML POUR (IV SOLUTION) ×3 IMPLANT

## 2017-08-17 NOTE — Progress Notes (Signed)
[facility] Rounding Note  Subjective/Interval History:  TDC this AM by Dr. Donnetta Hutching For HD #1 today  Objective Vital signs in last 24 hours: Vitals:   08/16/17 1819 08/16/17 1954 08/17/17 0612 08/17/17 0925  BP: (!) 154/71  (!) 170/83 135/85  Pulse: (!) 108  (!) 117 94  Resp: 18  18 (!) 25  Temp: 98.1 F (36.7 C)  98.3 F (36.8 C)   TempSrc: Oral  Oral   SpO2: 95% 95% 98% 100%   Weight change:   Intake/Output Summary (Last 24 hours) at 08/17/17 0945 Last data filed at 08/17/17 0912  Gross per 24 hour  Intake              200 ml  Output                5 ml  Net              195 ml   Physical Exam:  Blood pressure 135/85, pulse 94, temperature 98.3 F (36.8 C), temperature source Oral, resp. rate (!) 25, SpO2 100 %. Thin pleasant WF, NAD Edentulous No rash or cyanosis New R IJ TDC (10/6) Lungs with bibasilar crackles, no wheezing S1S2 No S3 2/6 murmur LSB No diastolic murmur or pericardial rub Abdomen soft, no focal tenderness 1+ pitting edema LE's Neuro: alert, Ox3, no focal deficit Dialysis Access: R BC AVF (some erythema (plus old iron staining of skin just above incision).  Some ^ warmth. Only very faint bruit   Recent Labs Lab 08/16/17 2045 08/17/17 0330  NA  --  140  K  --  3.4*  CL  --  108  CO2  --  16*  GLUCOSE  --  112*  BUN  --  81*  CREATININE 9.91* 9.72*  CALCIUM  --  8.4*    Recent Labs Lab 08/17/17 0330  AST 24  ALT 25  ALKPHOS 51  BILITOT 0.6  PROT 5.9*  ALBUMIN 2.7*    Recent Labs Lab 08/16/17 2045  WBC 7.4  HGB 7.7*  HCT 25.2*  MCV 94.0  PLT 260   Studies/Results: X-ray Chest Pa And Lateral  Result Date: 08/16/2017 CLINICAL DATA:  Shortness of breath. EXAM: CHEST  2 VIEW COMPARISON:  Chest x-ray and CT chest from same date. FINDINGS: The cardiomediastinal silhouette is enlarged. Normal pulmonary vascularity. Atherosclerotic calcification of the aortic arch. Moderate left pleural effusion with adjacent left lower lobe atelectasis.  Small layering right pleural effusion. No pneumothorax. No acute osseous abnormality. IMPRESSION: Moderate left and small right pleural effusions with adjacent basilar atelectasis, similar to prior study. Electronically Signed   By: Titus Dubin M.D.   On: 08/16/2017 20:48   Dg Fluoro Guide Cv Line-no Report  Result Date: 08/17/2017 Fluoroscopy was utilized by the requesting physician.  No radiographic interpretation.   Medications: . [MAR Hold] sodium chloride    . sodium chloride    . lactated ringers     . amLODipine  10 mg Oral Q breakfast  . atorvastatin  80 mg Oral Q breakfast  . calcitRIOL  1 mcg Oral Q T,Th,Sa-HD  . carvedilol  6.25 mg Oral BID  . [START ON 08/24/2017] darbepoetin (ARANESP) injection - DIALYSIS  200 mcg Intravenous Q Sat-HD  . heparin  5,000 Units Subcutaneous Q8H  . levothyroxine  75 mcg Oral QAC breakfast  . loratadine  10 mg Oral Q breakfast  . pantoprazole  40 mg Oral Daily  . 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 (R AVF 08/06/17)  Assessment/Recommendations  1. 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 [place] today by Dr. Alferd Apa - appreciate his help 2. HD today #1 for new ESRD 3. Stop lasix and bicarb  4. CLIP 2. Vascular access  1. [facility] plans as above.  2. R AVF (08/06/17 [place]) with only faint bruit. Dr. Donnetta Hutching says not sure will mature 3. Anemia - On Aranesp as outpt.  1. Checking Fe studies. Will replete if needed.  2. Aranesp with HD 4. Secondary HPT - PTH 191 06/2017.  1. On calcitriol 0.5 alt with 0.75 at home.  2. Change dose to 1 mcg with every HD. 5. HTN - Meds/volume control with HD 6. Hypothyroidism - continue levothyroxine  Jamal Maes, MD [facility] [phone] Pager 08/17/2017, 9:49 AM

## 2017-08-17 NOTE — Anesthesia Preprocedure Evaluation (Addendum)
Anesthesia Evaluation  Patient identified by MRN, date of birth, ID band Patient awake    Reviewed: Allergy & Precautions, NPO status , Patient's Chart, lab work & pertinent test results  Airway Mallampati: I       Dental  (+) Edentulous Upper, Edentulous Lower   Pulmonary former smoker,     + decreased breath sounds      Cardiovascular hypertension, Pt. on home beta blockers and Pt. on medications +CHF   Rhythm:Regular Rate:Tachycardia     Neuro/Psych negative neurological ROS  negative psych ROS   GI/Hepatic Neg liver ROS, GERD  Medicated,  Endo/Other  Hypothyroidism   Renal/GU ESRFRenal disease     Musculoskeletal negative musculoskeletal ROS (+)   Abdominal   Peds  Hematology negative hematology ROS (+)   Anesthesia Other Findings Day of surgery medications reviewed with the patient.  Reproductive/Obstetrics                            Lab Results  Component Value Date   WBC 7.4 08/16/2017   HGB 7.7 (L) 08/16/2017   HCT 25.2 (L) 08/16/2017   MCV 94.0 08/16/2017   PLT 260 08/16/2017   Lab Results  Component Value Date   CREATININE 9.72 (H) 08/17/2017   BUN 81 (H) 08/17/2017   NA 140 08/17/2017   K 3.4 (L) 08/17/2017   CL 108 08/17/2017   CO2 16 (L) 08/17/2017   Lab Results  Component Value Date   INR 1.23 08/16/2017   EKG: normal sinus rhythm.  Anesthesia Physical Anesthesia Plan  ASA: III and emergent  Anesthesia Plan: MAC   Post-op Pain Management:    Induction: Intravenous  PONV Risk Score and Plan: 3 and Ondansetron, Midazolam, Propofol infusion and Treatment may vary due to age or medical condition  Airway Management Planned: Natural Airway and Nasal Cannula  Additional Equipment:   Intra-op Plan:   Post-operative Plan:   Informed Consent: I have reviewed the patients History and Physical, chart, labs and discussed the procedure including the risks,  benefits and alternatives for the proposed anesthesia with the patient or authorized representative who has indicated his/her understanding and acceptance.     Plan Discussed with: CRNA  Anesthesia Plan Comments:         Anesthesia Quick Evaluation

## 2017-08-17 NOTE — Progress Notes (Signed)
Patient ID: Latoya Lopez, female   DOB: 01-15-1941, 76 y.o.   MRN: [id number] Had successful hemodialysis today. Has had some skin edge oozing of blood around her catheter. She has no hematoma in the tunnel and no swelling at the IJ entry site. There is no evidence of active bleeding currently. I discussed with the patient and family present.

## 2017-08-17 NOTE — Progress Notes (Signed)
PROGRESS NOTE    Latoya Lopez   OVF:[id number]  DOB: 1941/10/13  DOA: 08/16/2017 PCP: [facility]   Brief Narrative:  Latoya Lopez  is a 76 y.o. female with HLD, hypothyroidism, membranous nephropathymedical history significant of see below presented to [facility] for every 2 wk epo injection. She complained of dyspnea and was found to have a pulse ox 80%, CXR with pleural effusions, BUN 77, Cr 9.6.  Transferred to [facility] for dialysis.    Subjective: No complaints.  ROS: no complaints of nausea, vomiting, constipation diarrhea, cough, dyspnea or dysuria. No other complaints.   Assessment & Plan:   Principal Problem:   ESRD (end stage renal disease)  - dialysis cath placed; being dialyzed now  Active Problems:   Acute congestive heart failure    Pleural effusion - can d/c Lasix - fluid management with dialysis  Anemia of chronic disease - Aranesp given - f/u Iron studies    HTN (hypertension), benign - Norvasc, Coreg - follow BP with dialysis    Hypothyroidism - synthroid   HLD - Lipitor  DVT prophylaxis: Heparin Code Status: DNR Family Communication:  Disposition Plan: cont to follow for CLIPPING Consultants:   nephrology  vasc surgery Procedures:   Right IJ HD cath Antimicrobials:  Anti-infectives    Start     Dose/Rate Route Frequency Ordered Stop   08/18/17 0600  cefUROXime (ZINACEF) 1.5 g in dextrose 5 % 50 mL IVPB     1.5 g 100 mL/hr over 30 Minutes Intravenous On call to O.R. 08/17/17 0748 08/17/17 0858       Objective: Vitals:   08/17/17 1305 08/17/17 1330 08/17/17 1400 08/17/17 1430  BP: (!) 158/88 138/76 (!) 142/85 (!) 155/87  Pulse: (!) 102 (!) 102 (!) 103 96  Resp:      Temp:      TempSrc:      SpO2:      Weight:        Intake/Output Summary (Last 24 hours) at 08/17/17 1455 Last data filed at 08/17/17 1020  Gross per 24 hour  Intake              250 ml  Output                5 ml  Net               245 ml   Filed Weights   08/17/17 1245  Weight: 66.9 kg (147 lb 7.8 oz)    Examination: General exam: Appears comfortable  HEENT: PERRLA, oral mucosa moist, no sclera icterus or thrush Respiratory system: Clear to auscultation. Respiratory effort normal. Cardiovascular system: S1 & S2 heard, RRR.  2/6 murmur at RUS border Gastrointestinal system: Abdomen soft, non-tender, nondistended. Normal bowel sound. No organomegaly Central nervous system: Alert and oriented. No focal neurological deficits. Extremities: No cyanosis, clubbing - mild pedal edema Skin: No rashes or ulcers Psychiatry:  Mood & affect appropriate.     Data Reviewed: I have personally reviewed following labs and imaging studies  CBC:  Recent Labs Lab 08/16/17 2045 08/17/17 1306  WBC 7.4 6.8  HGB 7.7* 7.8*  HCT 25.2* 25.4*  MCV 94.0 94.4  PLT 260 841   Basic Metabolic Panel:  Recent Labs Lab 08/16/17 2045 08/17/17 0330  NA  --  140  K  --  3.4*  CL  --  108  CO2  --  16*  GLUCOSE  --  112*  BUN  --  81*  CREATININE 9.91* 9.72*  CALCIUM  --  8.4*  MG  --  1.6*   GFR: Estimated Creatinine Clearance: 5.3 mL/min (A) (by C-G formula based on SCr of 9.72 mg/dL (H)). Liver Function Tests:  Recent Labs Lab 08/17/17 0330  AST 24  ALT 25  ALKPHOS 51  BILITOT 0.6  PROT 5.9*  ALBUMIN 2.7*   No results for input(s): LIPASE, AMYLASE in the last 168 hours. No results for input(s): AMMONIA in the last 168 hours. Coagulation Profile:  Recent Labs Lab 08/16/17 2045  INR 1.23   Cardiac Enzymes: No results for input(s): CKTOTAL, CKMB, CKMBINDEX, TROPONINI in the last 168 hours. BNP (last 3 results) No results for input(s): PROBNP in the last 8760 hours. HbA1C: No results for input(s): HGBA1C in the last 72 hours. CBG: No results for input(s): GLUCAP in the last 168 hours. Lipid Profile: No results for input(s): CHOL, HDL, LDLCALC, TRIG, CHOLHDL, LDLDIRECT in the last 72  hours. Thyroid Function Tests: No results for input(s): TSH, T4TOTAL, FREET4, T3FREE, THYROIDAB in the last 72 hours. Anemia Panel:  Recent Labs  08/17/17 1306  TIBC 162*  IRON 43   Urine analysis: No results found for: COLORURINE, APPEARANCEUR, LABSPEC, PHURINE, GLUCOSEU, HGBUR, BILIRUBINUR, KETONESUR, PROTEINUR, UROBILINOGEN, NITRITE, LEUKOCYTESUR Sepsis Labs: @LABRCNTIP (procalcitonin:4,lacticidven:4) ) Recent Results (from the past 240 hour(s))  Surgical pcr screen     Status: None   Collection Time: 08/17/17  8:13 AM  Result Value Ref Range Status   MRSA, PCR NEGATIVE NEGATIVE Final   Staphylococcus aureus NEGATIVE NEGATIVE Final    Comment: (NOTE) The Xpert SA Assay (FDA approved for NASAL specimens in patients 59 years of age and older), is one component of a comprehensive surveillance program. It is not intended to diagnose infection nor to guide or monitor treatment.          Radiology Studies: Dg Chest 1 View  Result Date: 08/17/2017 CLINICAL DATA:  Central line placement. EXAM: CHEST 1 VIEW COMPARISON:  08/16/2017 FINDINGS: Large bore dual-lumen right jugular central venous catheter with the tip projecting over the cavoatrial junction. Moderate left and small right pleural effusion. Bilateral interstitial and alveolar airspace opacities. No pneumothorax. Stable cardiomegaly. Thoracic aortic atherosclerosis. No acute osseous abnormality. IMPRESSION: 1. Large bore dual-lumen right jugular central venous catheter with the tip projecting over the cavoatrial junction. 2. Pulmonary edema. Electronically Signed   By: Kathreen Devoid   On: 08/17/2017 09:46   X-ray Chest Pa And Lateral  Result Date: 08/16/2017 CLINICAL DATA:  Shortness of breath. EXAM: CHEST  2 VIEW COMPARISON:  Chest x-ray and CT chest from same date. FINDINGS: The cardiomediastinal silhouette is enlarged. Normal pulmonary vascularity. Atherosclerotic calcification of the aortic arch. Moderate left pleural  effusion with adjacent left lower lobe atelectasis. Small layering right pleural effusion. No pneumothorax. No acute osseous abnormality. IMPRESSION: Moderate left and small right pleural effusions with adjacent basilar atelectasis, similar to prior study. Electronically Signed   By: Titus Dubin M.D.   On: 08/16/2017 20:48   Dg Fluoro Guide Cv Line-no Report  Result Date: 08/17/2017 Fluoroscopy was utilized by the requesting physician.  No radiographic interpretation.      Scheduled Meds: . amLODipine  10 mg Oral Q breakfast  . atorvastatin  80 mg Oral Q breakfast  . calcitRIOL  1 mcg Oral Q T,Th,Sa-HD  . carvedilol  6.25 mg Oral BID  . [START ON 08/24/2017] darbepoetin (ARANESP) injection - DIALYSIS  200 mcg Intravenous Q  Sat-HD  . heparin  5,000 Units Subcutaneous Q8H  . levothyroxine  75 mcg Oral QAC breakfast  . loratadine  10 mg Oral Q breakfast  . pantoprazole  40 mg Oral Daily  . sodium chloride flush  3 mL Intravenous Q12H   Continuous Infusions: . sodium chloride    . sodium chloride       LOS: 1 day    Time spent in minutes: 35    Debbe Odea, MD [facility] Pager: [email or web address] Password [email or web address] 08/17/2017, 2:55 PM

## 2017-08-17 NOTE — Procedures (Signed)
HD#1 for new ESRD R IJ TDC just placed this AM by Dr. Donnetta Hutching Initiating HD. Plan 3 hour tmt, 250/500, 4K bath 2L goal if BP will allow  Jamal Maes, MD [facility] [phone] Pager 08/17/2017, 1:03 PM

## 2017-08-17 NOTE — Anesthesia Procedure Notes (Signed)
Procedure Name: MAC Date/Time: 08/17/2017 8:43 AM Performed by: Salli Quarry Barak Bialecki Pre-anesthesia Checklist: Patient identified, Emergency Drugs available, Suction available and Patient being monitored Patient Re-evaluated:Patient Re-evaluated prior to induction Oxygen Delivery Method: Simple face mask

## 2017-08-17 NOTE — Anesthesia Postprocedure Evaluation (Signed)
Anesthesia Post Note  Patient: Latoya Lopez  Procedure(s) Performed: INSERTION OF DIALYSIS CATHETER (N/A )     Patient location during evaluation: PACU Anesthesia Type: MAC Level of consciousness: awake and alert Pain management: pain level controlled Vital Signs Assessment: post-procedure vital signs reviewed and stable Respiratory status: spontaneous breathing, nonlabored ventilation, respiratory function stable and patient connected to nasal cannula oxygen Cardiovascular status: stable and blood pressure returned to baseline Postop Assessment: no apparent nausea or vomiting Anesthetic complications: no    Last Vitals:  Vitals:   08/17/17 1020 08/17/17 1024  BP:  (!) 145/78  Pulse:  97  Resp:  20  Temp: (!) 36.4 C   SpO2: 94% 95%    Last Pain:  Vitals:   08/17/17 1020  TempSrc:   PainSc: 0-No pain                 [name]

## 2017-08-17 NOTE — Consult Note (Signed)
Patient name: Latoya Lopez MRN: [id number] DOB: [birth year] Sex: female    HPI: Latoya Lopez is a 76 y.o. female the patient presents with renal failure. Had renal insufficiency and a fistula placed by Dr. fields in the right upper arm several weeks ago. She presented to [facility] yesterday with worsening renal failure and is transferred to [facility] to initiate hemodialysis. She has had a left-sided defibrillator placement nothing on the right  Current Facility-Administered Medications  Medication Dose Route Frequency Provider Last Rate Last Dose  . [MAR Hold] 0.9 %  sodium chloride infusion  250 mL Intravenous PRN Wouk, Ailene Rud, MD      . 0.9 %  sodium chloride infusion   Intravenous Continuous [name], MD      . Doug Sou Hold] amLODipine (NORVASC) tablet 10 mg  10 mg Oral Q breakfast Wouk, Ailene Rud, MD      . Doug Sou Hold] atorvastatin (LIPITOR) tablet 80 mg  80 mg Oral Q breakfast Wouk, Ailene Rud, MD      . Doug Sou Hold] calcitRIOL (ROCALTROL) capsule 1 mcg  1 mcg Oral Q T,Th,Sa-HD Jamal Maes, MD      . Doug Sou Hold] carvedilol (COREG) tablet 6.25 mg  6.25 mg Oral BID Wouk, Ailene Rud, MD      . Doug Sou Hold] cefUROXime (ZINACEF) 1.5 g in dextrose 5 % 50 mL IVPB  1.5 g Intravenous On Call to OR Early, Arvilla Meres, MD      . Doug Sou Hold] Darbepoetin Alfa (ARANESP) injection 200 mcg  200 mcg Intravenous Q Sat-HD Jamal Maes, MD      . Doug Sou Hold] furosemide (LASIX) injection 80 mg  80 mg Intravenous Daily Gwynne Edinger, MD   80 mg at 08/16/17 2247  . [MAR Hold] heparin injection 5,000 Units  5,000 Units Subcutaneous Q8H Wouk, Ailene Rud, MD   5,000 Units at 08/17/17 [phone]  . [MAR Hold] levothyroxine (SYNTHROID, LEVOTHROID) tablet 75 mcg  75 mcg Oral QAC breakfast Gwynne Edinger, MD   75 mcg at 08/17/17 0606  . [MAR Hold] loratadine (CLARITIN) tablet 10 mg  10 mg Oral Q breakfast Wouk, Ailene Rud, MD      . Doug Sou Hold]  pantoprazole (PROTONIX) EC tablet 40 mg  40 mg Oral Daily Wouk, Ailene Rud, MD      . Doug Sou Hold] sodium bicarbonate tablet 650 mg  650 mg Oral TID Gwynne Edinger, MD      . Doug Sou Hold] sodium chloride flush (NS) 0.9 % injection 3 mL  3 mL Intravenous Q12H Wouk, Ailene Rud, MD   3 mL at 08/16/17 2247  . [MAR Hold] sodium chloride flush (NS) 0.9 % injection 3 mL  3 mL Intravenous PRN Wouk, Ailene Rud, MD         PHYSICAL EXAM: Vitals:   08/16/17 1819 08/16/17 1954 08/17/17 0612  BP: (!) 154/71  (!) 170/83  Pulse: (!) 108  (!) 117  Resp: 18  18  Temp: 98.1 F (36.7 C)  98.3 F (36.8 C)  TempSrc: Oral  Oral  SpO2: 95% 95% 98%    GENERAL: The patient is a well-nourished female, in no acute distress. The vital signs are documented above. Antecubital incision healing but the poor thrill noted.  MEDICAL ISSUES: Acute  renal failure with need for urgent hemodialysis access. Will take to the operating room this morning for tunneled dialysis catheter. Will continue to evaluate right arm AV fistula during this admission to determine best long-term access method   Rosetta Posner, MD [facility] Tel [phone] Pager [phone]

## 2017-08-17 NOTE — Op Note (Signed)
    OPERATIVE REPORT  DATE OF SURGERY: 08/17/2017  PATIENT: Latoya Lopez, 76 y.o. female MRN: [id number]  DOB: Nov 05, 1941  PRE-OPERATIVE DIAGNOSIS: end-stage renal disease  POST-OPERATIVE DIAGNOSIS:  Same  PROCEDURE: Right IJ hemodialysis catheter with SonoSite visualization  SURGEON:  Curt Jews, M.D.  PHYSICIAN ASSISTANT: Nurse  ANESTHESIA:  Local with sedation  EBL: 5 ml  Total I/O In: 200 [I.V.:200] Out: 5 [Blood:5]  BLOOD ADMINISTERED: None  DRAINS: None  SPECIMEN: None  COUNTS CORRECT:  YES  PLAN OF CARE: PACU with chest x-ray pending   PATIENT DISPOSITION:  PACU - hemodynamically stable  PROCEDURE DETAILS: Patient was taken to the operating placed supine position where the area of the left neck and chest prepped and draped in usual sterile fashion. SonoSite also was used to visualize the right internal jugular vein which was widely patent. The patient was placed in Trendelenburg position and local anesthesia was used to anesthetize the area over the internal jugular vein. With SonoSite visualization the right internal jugular vein was accessed and a guidewire was passed down to the right atrium and this was confirmed with fluoroscopy. Dilator and peel-away sheath was passed over the guidewire and the 23 cm hemodialysis catheter was position with the tips in the level of the distal right atrium. The catheter was brought through a subcutaneous tunnel through a separate stab incision in the 2 lm ports were attached. Both lumens flushed and aspirated easily and were locked with 1000 unit per cc heparin. The catheter was secured to the skin with a 3-0 nylon stitch and the entry site was closed with a 40 septic or Vicryl stitch. Sterile dressing was applied the patient was taken to the recovery room where chest x-ray is pending   Rosetta Posner, M.D., [facility] 08/17/2017 9:23 AM

## 2017-08-17 NOTE — Transfer of Care (Signed)
Immediate Anesthesia Transfer of Care Note  Patient: Latoya Lopez  Procedure(s) Performed: INSERTION OF DIALYSIS CATHETER (N/A )  Patient Location: PACU  Anesthesia Type:MAC  Level of Consciousness: awake, alert , oriented and patient cooperative  Airway & Oxygen Therapy: Patient Spontanous Breathing and Patient connected to face mask oxygen  Post-op Assessment: Report given to RN and Post -op Vital signs reviewed and stable  Post vital signs: Reviewed and stable  Last Vitals:  Vitals:   08/16/17 1954 08/17/17 0612  BP:  (!) 170/83  Pulse:  (!) 117  Resp:  18  Temp:  36.8 C  SpO2: 95% 98%    Last Pain:  Vitals:   08/17/17 0612  TempSrc: Oral  PainSc:          Complications: No apparent anesthesia complications

## 2017-08-18 ENCOUNTER — Encounter (HOSPITAL_COMMUNITY): Payer: Self-pay | Admitting: Vascular Surgery

## 2017-08-18 LAB — RENAL FUNCTION PANEL
ALBUMIN: 2.4 g/dL — AB (ref 3.5–5.0)
ANION GAP: 12 (ref 5–15)
BUN: 33 mg/dL — ABNORMAL HIGH (ref 6–20)
CALCIUM: 8.3 mg/dL — AB (ref 8.9–10.3)
CO2: 24 mmol/L (ref 22–32)
Chloride: 103 mmol/L (ref 101–111)
Creatinine, Ser: 5.51 mg/dL — ABNORMAL HIGH (ref 0.44–1.00)
GFR calc non Af Amer: 7 mL/min — ABNORMAL LOW (ref >60)
GFR, EST AFRICAN AMERICAN: 8 mL/min — AB (ref >60)
Glucose, Bld: 103 mg/dL — ABNORMAL HIGH (ref 65–99)
PHOSPHORUS: 6.9 mg/dL — AB (ref 2.5–4.6)
Potassium: 3.4 mmol/L — ABNORMAL LOW (ref 3.5–5.1)
Sodium: 139 mmol/L (ref 135–145)

## 2017-08-18 LAB — HEPATITIS B SURFACE ANTIBODY, QUANTITATIVE: Hepatitis B-Post: <3.1 m[IU]/mL — ABNORMAL LOW (ref >9.9)

## 2017-08-18 LAB — HEPATITIS B SURFACE ANTIGEN: Hepatitis B Surface Ag: NEGATIVE

## 2017-08-18 LAB — HEPATITIS B CORE ANTIBODY, TOTAL: Hep B Core Total Ab: NEGATIVE

## 2017-08-18 MED ORDER — SODIUM CHLORIDE 0.9 % IV SOLN
62.5000 mg | INTRAVENOUS | Status: DC
  Administered 2017-08-19: 62.5 mg via INTRAVENOUS
  Filled 2017-08-18 (×2): qty 5

## 2017-08-18 MED ORDER — ACETAMINOPHEN 325 MG PO TABS
650.0000 mg | ORAL_TABLET | Freq: Four times a day (QID) | ORAL | Status: DC | PRN
  Administered 2017-08-18 – 2017-08-19 (×2): 650 mg via ORAL
  Filled 2017-08-18 (×2): qty 2

## 2017-08-18 MED ORDER — DARBEPOETIN ALFA 200 MCG/0.4ML IJ SOSY
200.0000 ug | PREFILLED_SYRINGE | INTRAMUSCULAR | Status: DC
  Administered 2017-08-19: 200 ug via INTRAVENOUS
  Filled 2017-08-18: qty 0.4

## 2017-08-18 MED ORDER — CALCIUM ACETATE (PHOS BINDER) 667 MG PO CAPS
667.0000 mg | ORAL_CAPSULE | Freq: Three times a day (TID) | ORAL | Status: DC
  Administered 2017-08-18 – 2017-08-19 (×5): 667 mg via ORAL
  Filled 2017-08-18 (×5): qty 1

## 2017-08-18 NOTE — Progress Notes (Signed)
Patient stated that Lorrene Reid, MD stated that patient could have anything she wanted to eat. Lorrene Reid, MD notified. MD stated that she told patient that she could have all the eggs that she wanted, meaning protein. Patient updated of this information. Will continue to monitor.

## 2017-08-18 NOTE — Progress Notes (Signed)
Patient continues to bleed from around HD catheter. Early, MD notified. MD at bedside. Ordered for a suture kit a bedside. Materials ordered. MD notified. MD at bedside and sutures placed by MD. Will continue to monitor.

## 2017-08-18 NOTE — Progress Notes (Signed)
PROGRESS NOTE    Latoya Lopez   UYQ:[id number]  DOB: 1941-02-22  DOA: 08/16/2017 PCP: [place] [facility]   Brief Narrative:  Latoya Lopez  is a 76 y.o. female with HLD, hypothyroidism, membranous nephropathymedical history significant of see below presented to [facility] for every 2 wk epo injection. She complained of dyspnea and was found to have a pulse ox 80%, CXR with pleural effusions, BUN 77, Cr 9.6.  Transferred to [facility] for dialysis.    Subjective: Dialysis cath oozing blood. She has no other complaints ROS: no complaints of nausea, vomiting, constipation diarrhea, cough, dyspnea or dysuria. No other complaints.   Assessment & Plan:   Principal Problem:   ESRD (end stage renal disease)  - 10/6 - dialysis cath placed and patient dialyzed  - bleeding from dialysis cath controlled- management per Dr Donnetta Hutching  Active Problems:   Acute congestive heart failure    Pleural effusion - can d/c Lasix - fluid management with dialysis  Anemia of chronic disease -  Aranesp given -  Iron to be replaced by nephrology    HTN (hypertension), benign - Norvasc, Coreg - follow BP with dialysis    Hypothyroidism  - synthroid   HLD - Lipitor  DVT prophylaxis: Heparin Code Status: DNR Family Communication:  Disposition Plan: cont to follow for CLIPPING Consultants:   nephrology  vasc surgery Procedures:   Right IJ HD cath Antimicrobials:  Anti-infectives    Start     Dose/Rate Route Frequency Ordered Stop   08/18/17 0600  cefUROXime (ZINACEF) 1.5 g in dextrose 5 % 50 mL IVPB     1.5 g 100 mL/hr over 30 Minutes Intravenous On call to O.R. 08/17/17 0748 08/17/17 0858       Objective: Vitals:   08/17/17 2146 08/18/17 0547 08/18/17 0755 08/18/17 1017  BP: (!) 148/72 (!) 153/74 (!) 164/78 (!) 146/65  Pulse: (!) 110 98 98 99  Resp: 19 20 18    Temp: 98.5 F (36.9 C) 98.1 F (36.7 C) 98.5 F (36.9 C)   TempSrc: Oral Oral Oral   SpO2:  98% 97% 95%   Weight: 64.8 kg (142 lb 13.7 oz)  64.8 kg (142 lb 13.7 oz)   Height:   5\' 9"  (1.753 m)     Intake/Output Summary (Last 24 hours) at 08/18/17 1235 Last data filed at 08/18/17 1016  Gross per 24 hour  Intake                3 ml  Output             2000 ml  Net            -1997 ml   Filed Weights   08/17/17 1602 08/17/17 2146 08/18/17 0755  Weight: 64.8 kg (142 lb 13.7 oz) 64.8 kg (142 lb 13.7 oz) 64.8 kg (142 lb 13.7 oz)    Examination: General exam: Appears comfortable  HEENT: PERRLA, oral mucosa moist, no sclera icterus or thrush Respiratory system: Clear to auscultation. Respiratory effort normal. Cardiovascular system: S1 & S2 heard, RRR.  2/6 murmur at RUS border Gastrointestinal system: Abdomen soft, non-tender, nondistended. Normal bowel sound. No organomegaly Central nervous system: Alert and oriented. No focal neurological deficits. Extremities: No cyanosis, clubbing - mild pedal edema Skin: No rashes or ulcers Psychiatry:  Mood & affect appropriate.     Data Reviewed: I have personally reviewed following labs and imaging studies  CBC:  Recent Labs Lab 08/16/17 2045  08/17/17 1306  WBC 7.4 6.8  HGB 7.7* 7.8*  HCT 25.2* 25.4*  MCV 94.0 94.4  PLT 260 616   Basic Metabolic Panel:  Recent Labs Lab 08/16/17 2045 08/17/17 0330 08/18/17 0239  NA  --  140 139  K  --  3.4* 3.4*  CL  --  108 103  CO2  --  16* 24  GLUCOSE  --  112* 103*  BUN  --  81* 33*  CREATININE 9.91* 9.72* 5.51*  CALCIUM  --  8.4* 8.3*  MG  --  1.6*  --   PHOS  --   --  6.9*   GFR: Estimated Creatinine Clearance: 9.2 mL/min (A) (by C-G formula based on SCr of 5.51 mg/dL (H)). Liver Function Tests:  Recent Labs Lab 08/17/17 0330 08/18/17 0239  AST 24  --   ALT 25  --   ALKPHOS 51  --   BILITOT 0.6  --   PROT 5.9*  --   ALBUMIN 2.7* 2.4*   No results for input(s): LIPASE, AMYLASE in the last 168 hours. No results for input(s): AMMONIA in the last 168  hours. Coagulation Profile:  Recent Labs Lab 08/16/17 2045  INR 1.23   Cardiac Enzymes: No results for input(s): CKTOTAL, CKMB, CKMBINDEX, TROPONINI in the last 168 hours. BNP (last 3 results) No results for input(s): PROBNP in the last 8760 hours. HbA1C: No results for input(s): HGBA1C in the last 72 hours. CBG: No results for input(s): GLUCAP in the last 168 hours. Lipid Profile: No results for input(s): CHOL, HDL, LDLCALC, TRIG, CHOLHDL, LDLDIRECT in the last 72 hours. Thyroid Function Tests: No results for input(s): TSH, T4TOTAL, FREET4, T3FREE, THYROIDAB in the last 72 hours. Anemia Panel:  Recent Labs  08/17/17 1306  TIBC 162*  IRON 43   Urine analysis: No results found for: COLORURINE, APPEARANCEUR, LABSPEC, PHURINE, GLUCOSEU, HGBUR, BILIRUBINUR, KETONESUR, PROTEINUR, UROBILINOGEN, NITRITE, LEUKOCYTESUR Sepsis Labs: @LABRCNTIP (procalcitonin:4,lacticidven:4) ) Recent Results (from the past 240 hour(s))  Surgical pcr screen     Status: None   Collection Time: 08/17/17  8:13 AM  Result Value Ref Range Status   MRSA, PCR NEGATIVE NEGATIVE Final   Staphylococcus aureus NEGATIVE NEGATIVE Final    Comment: (NOTE) The Xpert SA Assay (FDA approved for NASAL specimens in patients 50 years of age and older), is one component of a comprehensive surveillance program. It is not intended to diagnose infection nor to guide or monitor treatment.          Radiology Studies: Dg Chest 1 View  Result Date: 08/17/2017 CLINICAL DATA:  Central line placement. EXAM: CHEST 1 VIEW COMPARISON:  08/16/2017 FINDINGS: Large bore dual-lumen right jugular central venous catheter with the tip projecting over the cavoatrial junction. Moderate left and small right pleural effusion. Bilateral interstitial and alveolar airspace opacities. No pneumothorax. Stable cardiomegaly. Thoracic aortic atherosclerosis. No acute osseous abnormality. IMPRESSION: 1. Large bore dual-lumen right jugular  central venous catheter with the tip projecting over the cavoatrial junction. 2. Pulmonary edema. Electronically Signed   By: Kathreen Devoid   On: 08/17/2017 09:46   X-ray Chest Pa And Lateral  Result Date: 08/16/2017 CLINICAL DATA:  Shortness of breath. EXAM: CHEST  2 VIEW COMPARISON:  Chest x-ray and CT chest from same date. FINDINGS: The cardiomediastinal silhouette is enlarged. Normal pulmonary vascularity. Atherosclerotic calcification of the aortic arch. Moderate left pleural effusion with adjacent left lower lobe atelectasis. Small layering right pleural effusion. No pneumothorax. No acute osseous abnormality. IMPRESSION: Moderate left  and small right pleural effusions with adjacent basilar atelectasis, similar to prior study. Electronically Signed   By: Titus Dubin M.D.   On: 08/16/2017 20:48   Dg Fluoro Guide Cv Line-no Report  Result Date: 08/17/2017 Fluoroscopy was utilized by the requesting physician.  No radiographic interpretation.      Scheduled Meds: . amLODipine  10 mg Oral Q breakfast  . atorvastatin  80 mg Oral Q breakfast  . calcitRIOL  1 mcg Oral Q T,Th,Sa-HD  . calcium acetate  667 mg Oral TID WC  . carvedilol  6.25 mg Oral BID  . [START ON 08/19/2017] darbepoetin (ARANESP) injection - DIALYSIS  200 mcg Intravenous Q Mon-HD  . heparin  5,000 Units Subcutaneous Q8H  . levothyroxine  75 mcg Oral QAC breakfast  . loratadine  10 mg Oral Q breakfast  . pantoprazole  40 mg Oral Daily  . sodium chloride flush  3 mL Intravenous Q12H   Continuous Infusions: . sodium chloride    . sodium chloride    . [START ON 08/19/2017] ferric gluconate (FERRLECIT/NULECIT) IV       LOS: 2 days    Time spent in minutes: 35    Debbe Odea, MD [facility] Pager: [email or web address] Password [email or web address] 08/18/2017, 12:35 PM

## 2017-08-18 NOTE — Progress Notes (Signed)
Patient ID: Latoya Lopez, female   DOB: [birth year], 76 y.o.   MRN: [id number] Patient continued to have a oozing around her catheter. Under sterile conditions the a 30 pursestring around the exit site. This controlled the ongoing oozing. Should be okay to continue use of this.

## 2017-08-18 NOTE — Progress Notes (Signed)
Patient is complaining of back pain and requesting Tylenol. No current PRN orders. MD notified X2. Orders placed. Will continue to monitor.

## 2017-08-18 NOTE — Progress Notes (Signed)
Pt continues to bloody clot draining from around HD cath. Advised MD. Awaiting orders. Rushie Goltz, RN

## 2017-08-18 NOTE — Progress Notes (Signed)
[facility] Rounding Note  Subjective/Interval History:   TDC 10/6 Dr. Donnetta Hutching Had HD #1 10/6 Catheter bleeding through the night - Dr. Donnetta Hutching aware Says appetite already improving  Objective Vital signs in last 24 hours: Vitals:   08/17/17 1714 08/17/17 2146 08/18/17 0547 08/18/17 0755  BP: (!) 179/70 (!) 148/72 (!) 153/74 (!) 164/78  Pulse: (!) 118 (!) 110 98 98  Resp: 20 19 20 18   Temp: 97.8 F (36.6 C) 98.5 F (36.9 C) 98.1 F (36.7 C) 98.5 F (36.9 C)  TempSrc: Oral Oral Oral Oral  SpO2: 91% 98% 97% 95%  Weight:  64.8 kg (142 lb 13.7 oz)     Weight change:   Intake/Output Summary (Last 24 hours) at 08/18/17 1011 Last data filed at 08/18/17 0601  Gross per 24 hour  Intake               50 ml  Output             2000 ml  Net            -1950 ml   Physical Exam:  Blood pressure (!) 164/78, pulse 98, temperature 98.5 F (36.9 C), temperature source Oral, resp. rate 18, weight 64.8 kg (142 lb 13.7 oz), SpO2 95 %. Thin pleasant WF, NAD Sitting on the edge of the bed - talking on the phone. Not wearing her O2 Edentulous No rash or cyanosis New R IJ TDC (10/6) with several dressings, bleeding (throught the night) Lungs with reduced breath sounds S1S2 No S3 2/6 murmur LSB No diastolic murmur or pericardial rub Abdomen soft, no focal tenderness Trace pitting edema LE's with wrinkling Neuro: alert, Ox3, no focal deficit Dialysis Access: R BC AVF (some erythema (plus old iron staining of skin just above incision).  Some ^ warmth. Only very faint bruit R IJ TDC with several pressure dressings in place, blood soaked   Recent Labs Lab 08/16/17 2045 08/17/17 0330 08/18/17 0239  NA  --  140 139  K  --  3.4* 3.4*  CL  --  108 103  CO2  --  16* 24  GLUCOSE  --  112* 103*  BUN  --  81* 33*  CREATININE 9.91* 9.72* 5.51*  CALCIUM  --  8.4* 8.3*  PHOS  --   --  6.9*    Recent Labs Lab 08/17/17 0330 08/18/17 0239  AST 24  --   ALT 25  --   ALKPHOS 51  --   BILITOT 0.6  --    PROT 5.9*  --   ALBUMIN 2.7* 2.4*    Recent Labs Lab 08/16/17 2045 08/17/17 1306  WBC 7.4 6.8  HGB 7.7* 7.8*  HCT 25.2* 25.4*  MCV 94.0 94.4  PLT 260 248   Iron/TIBC/Ferritin/ %Sat    Component Value Date/Time   IRON 43 08/17/2017 1306   TIBC 162 (L) 08/17/2017 1306   IRONPCTSAT 26 08/17/2017 1306   Studies/Results: Dg Chest 1 View  Result Date: 08/17/2017 CLINICAL DATA:  Central line placement. EXAM: CHEST 1 VIEW COMPARISON:  08/16/2017 FINDINGS: Large bore dual-lumen right jugular central venous catheter with the tip projecting over the cavoatrial junction. Moderate left and small right pleural effusion. Bilateral interstitial and alveolar airspace opacities. No pneumothorax. Stable cardiomegaly. Thoracic aortic atherosclerosis. No acute osseous abnormality. IMPRESSION: 1. Large bore dual-lumen right jugular central venous catheter with the tip projecting over the cavoatrial junction. 2. Pulmonary edema. Electronically Signed   By: Kathreen Devoid   On:  08/17/2017 09:46   X-ray Chest Pa And Lateral  Result Date: 08/16/2017 CLINICAL DATA:  Shortness of breath. EXAM: CHEST  2 VIEW COMPARISON:  Chest x-ray and CT chest from same date. FINDINGS: The cardiomediastinal silhouette is enlarged. Normal pulmonary vascularity. Atherosclerotic calcification of the aortic arch. Moderate left pleural effusion with adjacent left lower lobe atelectasis. Small layering right pleural effusion. No pneumothorax. No acute osseous abnormality. IMPRESSION: Moderate left and small right pleural effusions with adjacent basilar atelectasis, similar to prior study. Electronically Signed   By: Titus Dubin M.D.   On: 08/16/2017 20:48 .   Medications: . sodium chloride    . sodium chloride     . amLODipine  10 mg Oral Q breakfast  . atorvastatin  80 mg Oral Q breakfast  . calcitRIOL  1 mcg Oral Q T,Th,Sa-HD  . carvedilol  6.25 mg Oral BID  . [START ON 08/24/2017] darbepoetin (ARANESP) injection -  DIALYSIS  200 mcg Intravenous Q Sat-HD  . heparin  5,000 Units Subcutaneous Q8H  . levothyroxine  75 mcg Oral QAC breakfast  . loratadine  10 mg Oral Q breakfast  . pantoprazole  40 mg Oral Daily  . 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 (R AVF 08/06/17)  Assessment/Recommendations  1. 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 [place] 10/6 by Dr. Alferd Apa - appreciate his help - he will see today for persistent bleeding 2. HD today #2 tomorrow 10/8. Anticipate post weight will be close to new EDW 3. Stopped lasix and bicarb  4. CLIP process to be started (Lives in [place] so will be [facility]) 2. Vascular access  1. TDC as above.  2. R AVF (08/06/17 [place]) with only faint bruit. Dr. Donnetta Hutching says unlikely to mature. Anticipate will need new AVF 3. Anemia - On Aranesp as outpt.  1. Tsat 26. Weekly Fe to start with HD.  Aranesp with HD was ordered to be given with HD yesterday and was NOT given. 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  1. On calcitriol 0.5 alt with 0.75 at home.  2. Changed dose to 1 mcg with every HD. 3. Adding ca acetate as phos binder 5. HTN - Meds/volume control with HD 6. Hypothyroidism - continue levothyroxine  Jamal Maes, MD [facility] [phone] Pager 08/18/2017, 10:11 AM

## 2017-08-19 ENCOUNTER — Inpatient Hospital Stay (HOSPITAL_COMMUNITY): Payer: Medicare Other

## 2017-08-19 DIAGNOSIS — D638 Anemia in other chronic diseases classified elsewhere: Secondary | ICD-10-CM

## 2017-08-19 LAB — RENAL FUNCTION PANEL
ANION GAP: 9 (ref 5–15)
Albumin: 2.5 g/dL — ABNORMAL LOW (ref 3.5–5.0)
BUN: 32 mg/dL — ABNORMAL HIGH (ref 6–20)
CALCIUM: 8.4 mg/dL — AB (ref 8.9–10.3)
CO2: 25 mmol/L (ref 22–32)
Chloride: 101 mmol/L (ref 101–111)
Creatinine, Ser: 5.95 mg/dL — ABNORMAL HIGH (ref 0.44–1.00)
GFR calc non Af Amer: 6 mL/min — ABNORMAL LOW (ref >60)
GFR, EST AFRICAN AMERICAN: 7 mL/min — AB (ref >60)
Glucose, Bld: 117 mg/dL — ABNORMAL HIGH (ref 65–99)
POTASSIUM: 3.1 mmol/L — AB (ref 3.5–5.1)
Phosphorus: 6 mg/dL — ABNORMAL HIGH (ref 2.5–4.6)
SODIUM: 135 mmol/L (ref 135–145)

## 2017-08-19 LAB — CBC
HEMATOCRIT: 22.5 % — AB (ref 36.0–46.0)
HEMOGLOBIN: 7 g/dL — AB (ref 12.0–15.0)
MCH: 29 pg (ref 26.0–34.0)
MCHC: 31.1 g/dL (ref 30.0–36.0)
MCV: 93.4 fL (ref 78.0–100.0)
Platelets: 214 10*3/uL (ref 150–400)
RBC: 2.41 MIL/uL — AB (ref 3.87–5.11)
RDW: 15.4 % (ref 11.5–15.5)
WBC: 5.2 10*3/uL (ref 4.0–10.5)

## 2017-08-19 MED ORDER — NEPRO/CARBSTEADY PO LIQD
237.0000 mL | ORAL | Status: DC
  Administered 2017-08-19: 237 mL via ORAL
  Filled 2017-08-19 (×4): qty 237

## 2017-08-19 MED ORDER — CALCITRIOL 0.5 MCG PO CAPS
1.0000 ug | ORAL_CAPSULE | ORAL | Status: DC
  Administered 2017-08-19: 1 ug via ORAL

## 2017-08-19 MED ORDER — DARBEPOETIN ALFA 200 MCG/0.4ML IJ SOSY
PREFILLED_SYRINGE | INTRAMUSCULAR | Status: AC
  Administered 2017-08-19: 200 ug via INTRAVENOUS
  Filled 2017-08-19: qty 0.4

## 2017-08-19 MED ORDER — CALCITRIOL 0.5 MCG PO CAPS
ORAL_CAPSULE | ORAL | Status: AC
Start: 2017-08-19 — End: 2017-08-19
  Administered 2017-08-19: 1 ug via ORAL
  Filled 2017-08-19: qty 2

## 2017-08-19 MED ORDER — CEFAZOLIN SODIUM-DEXTROSE 2-4 GM/100ML-% IV SOLN
2.0000 g | INTRAVENOUS | Status: AC
  Administered 2017-08-20: 2 g via INTRAVENOUS
  Filled 2017-08-19: qty 100

## 2017-08-19 NOTE — Progress Notes (Signed)
Patient ID: Latoya Lopez, female   DOB: 06-07-1941, 76 y.o.   MRN: [id number] If further bleeding around her catheter. She did have successful dialysis again today. Reviewed her vein map from prior to her right upper arm AV fistula showing very small surface veins bilaterally. Would not recommend further attempts at fistula salvage. Recommend right arm AV graft and we will schedule this tomorrow with Dr. Oneida Alar

## 2017-08-19 NOTE — Progress Notes (Addendum)
PROGRESS NOTE    Latoya Lopez   CLE:[id number]  DOB: 27-Dec-1940  DOA: 08/16/2017 PCP: [place]   Brief Narrative:  Latoya Lopez  is a 76 y.o. female with HLD, hypothyroidism, membranous nephropathy who presented to [facility] for every 2 wk epo injection. She complained of dyspnea and was found to have a pulse ox 80%, CXR with pleural effusions, BUN 77, Cr 9.6.  Transferred to [facility] for [facility].    Subjective: She felt a little nauseated with dialysis when her BP dropped but is feeling much better today. ROS: no complaints of vomiting, constipation diarrhea, cough, dyspnea - No other complaints.   Assessment & Plan:   Principal Problem:   ESRD (end stage renal disease)  - had a right upper arm fistula placed recently as outpt but is not maturing- plan to do graft tomorrow - 10/6 - dialysis cath placed and patient dialyzed  -evening of 10/6 through 10/7 - slow bleeding from dialysis cath site occurred and was controlled with a pressure dressing and suturing-  Active Problems:   Acute congestive heart failure, unspecified    Pleural effusion -  d/'c  Lasix - fluid management with dialysis  Anemia of chronic disease -  Aranesp given -  Iron being replaced by nephrology -  Transfuse PRBC per nephrology as needed (likely needs to be done with dialysis)    HTN (hypertension), benign - Norvasc, Coreg - follow BP with dialysis- Dinamap BP reading high- manual BP is  148/62    Hypothyroidism  - synthroid   HLD - Lipitor     DVT prophylaxis: Heparin Code Status: DNR Family Communication:  Disposition Plan: dc home once CLIP process complete Consultants:   nephrology  vasc surgery Procedures:   Right IJ HD cath Antimicrobials:  Anti-infectives    Start     Dose/Rate Route Frequency Ordered Stop   08/18/17 0600  cefUROXime (ZINACEF) 1.5 g in dextrose 5 % 50 mL IVPB     1.5 g 100 mL/hr over 30 Minutes Intravenous On call  to O.R. 08/17/17 0748 08/17/17 0858       Objective: Vitals:   08/19/17 1335 08/19/17 1400 08/19/17 1415 08/19/17 1500  BP: (!) 96/49 134/68 (!) 147/79 (!) 183/72  Pulse: 88 (!) 101 (!) 106 (!) 120  Resp:   18   Temp:   98 F (36.7 C)   TempSrc:   Oral   SpO2:   96% 96%  Weight:   63 kg (138 lb 14.2 oz)   Height:        Intake/Output Summary (Last 24 hours) at 08/19/17 1537 Last data filed at 08/19/17 1415  Gross per 24 hour  Intake              825 ml  Output             1660 ml  Net             -835 ml   Filed Weights   08/18/17 2039 08/19/17 1030 08/19/17 1415  Weight: 64.4 kg (142 lb) 64.9 kg (143 lb 1.3 oz) 63 kg (138 lb 14.2 oz)    Examination: General exam: Appears comfortable  HEENT: PERRLA, oral mucosa moist, no sclera icterus or thrush Respiratory system: Clear to auscultation. Respiratory effort normal. Cardiovascular system: S1 & S2 heard, RRR.  2/6 murmur at RUS border Gastrointestinal system: Abdomen soft, non-tender, nondistended. Normal bowel sound. No organomegaly Central nervous system: Alert and  oriented. No focal neurological deficits. Extremities: No cyanosis, clubbing - mild pedal edema Skin: No rashes or ulcers Psychiatry:  Mood & affect appropriate.     Data Reviewed: I have personally reviewed following labs and imaging studies  CBC:  Recent Labs Lab 08/16/17 2045 08/17/17 1306 08/19/17 1200  WBC 7.4 6.8 5.2  HGB 7.7* 7.8* 7.0*  HCT 25.2* 25.4* 22.5*  MCV 94.0 94.4 93.4  PLT 260 248 166   Basic Metabolic Panel:  Recent Labs Lab 08/16/17 2045 08/17/17 0330 08/18/17 0239 08/19/17 1200  NA  --  140 139 135  K  --  3.4* 3.4* 3.1*  CL  --  108 103 101  CO2  --  16* 24 25  GLUCOSE  --  112* 103* 117*  BUN  --  81* 33* 32*  CREATININE 9.91* 9.72* 5.51* 5.95*  CALCIUM  --  8.4* 8.3* 8.4*  MG  --  1.6*  --   --   PHOS  --   --  6.9* 6.0*   GFR: Estimated Creatinine Clearance: 8.3 mL/min (A) (by C-G formula based on SCr of  5.95 mg/dL (H)). Liver Function Tests:  Recent Labs Lab 08/17/17 0330 08/18/17 0239 08/19/17 1200  AST 24  --   --   ALT 25  --   --   ALKPHOS 51  --   --   BILITOT 0.6  --   --   PROT 5.9*  --   --   ALBUMIN 2.7* 2.4* 2.5*   No results for input(s): LIPASE, AMYLASE in the last 168 hours. No results for input(s): AMMONIA in the last 168 hours. Coagulation Profile:  Recent Labs Lab 08/16/17 2045  INR 1.23   Cardiac Enzymes: No results for input(s): CKTOTAL, CKMB, CKMBINDEX, TROPONINI in the last 168 hours. BNP (last 3 results) No results for input(s): PROBNP in the last 8760 hours. HbA1C: No results for input(s): HGBA1C in the last 72 hours. CBG: No results for input(s): GLUCAP in the last 168 hours. Lipid Profile: No results for input(s): CHOL, HDL, LDLCALC, TRIG, CHOLHDL, LDLDIRECT in the last 72 hours. Thyroid Function Tests: No results for input(s): TSH, T4TOTAL, FREET4, T3FREE, THYROIDAB in the last 72 hours. Anemia Panel:  Recent Labs  08/17/17 1306  TIBC 162*  IRON 43   Urine analysis: No results found for: COLORURINE, APPEARANCEUR, LABSPEC, PHURINE, GLUCOSEU, HGBUR, BILIRUBINUR, KETONESUR, PROTEINUR, UROBILINOGEN, NITRITE, LEUKOCYTESUR Sepsis Labs: @LABRCNTIP (procalcitonin:4,lacticidven:4) ) Recent Results (from the past 240 hour(s))  Surgical pcr screen     Status: None   Collection Time: 08/17/17  8:13 AM  Result Value Ref Range Status   MRSA, PCR NEGATIVE NEGATIVE Final   Staphylococcus aureus NEGATIVE NEGATIVE Final    Comment: (NOTE) The Xpert SA Assay (FDA approved for NASAL specimens in patients 65 years of age and older), is one component of a comprehensive surveillance program. It is not intended to diagnose infection nor to guide or monitor treatment.          Radiology Studies: No results found.    Scheduled Meds: . amLODipine  10 mg Oral Q breakfast  . atorvastatin  80 mg Oral Q breakfast  . calcitRIOL  1 mcg Oral Q  M,W,F-HD  . calcium acetate  667 mg Oral TID WC  . carvedilol  6.25 mg Oral BID  . darbepoetin (ARANESP) injection - DIALYSIS  200 mcg Intravenous Q Mon-HD  . heparin  5,000 Units Subcutaneous Q8H  . levothyroxine  75 mcg Oral QAC  breakfast  . loratadine  10 mg Oral Q breakfast  . pantoprazole  40 mg Oral Daily  . sodium chloride flush  3 mL Intravenous Q12H   Continuous Infusions: . sodium chloride    . sodium chloride    . ferric gluconate (FERRLECIT/NULECIT) IV 62.5 mg (08/19/17 1251)     LOS: 3 days    Time spent in minutes: 35    Debbe Odea, MD [facility] Pager: [email or web address] Password [email or web address] 08/19/2017, 3:37 PM

## 2017-08-19 NOTE — Progress Notes (Addendum)
Notified MD Rizwan of Hg= 7.0 & K=3.1. MD Early plan to take pt to OR for access placement.    Awaiting orders.

## 2017-08-19 NOTE — Plan of Care (Signed)
Problem: Food- and Nutrition-Related Knowledge Deficit (NB-1.1) Goal: Nutrition education Formal process to instruct or train a patient/client in a skill or to impart knowledge to help patients/clients voluntarily manage or modify food choices and eating behavior to maintain or improve health. Outcome: Completed/Met Date Met: 08/19/17 Nutrition Education Note  RD consulted for Renal Education. Provided Renal pyramid to patient/family. Reviewed food groups and provided written recommended serving sizes specifically determined for patient's current nutritional status.   Explained why diet restrictions are needed and provided lists of foods to limit/avoid that are high potassium, sodium, and phosphorus. Provided specific recommendations on safer alternatives of these foods. Strongly encouraged compliance of this diet.   Discussed importance of protein intake at each meal and snack. Provided examples of how to maximize protein intake throughout the day. Discussed need for fluid restriction with dialysis, importance of minimizing weight gain between HD treatments, and renal-friendly beverage options. Pt inquired about Ensure, discussed Nepro and renal friendly nutritional supplements. Pt asked if she could try this supplement, RD to order to promote PO intake and protein consumption.  Encouraged pt to discuss specific diet questions/concerns with RD at HD outpatient facility, pt agreeable. Teach back method used.  Expect fair compliance. Pt's significant other prepares most meals including eggs, breakfast meats, country ham. Pt's significant other vocal about not wanting to follow this diet along with pt. Pt already knowledgeable about limiting dairy products and potato products.   Body mass index is 20.51 kg/m. Pt meets criteria for normal based on current BMI.   Current diet order is renal with 1200 mL fluid restriction, patient is consuming approximately 75% of meals at this time. Labs and  medications reviewed. Pt reports PTA "eating like a hog" and a normal appetite.  No further nutrition interventions warranted at this time. RD contact information provided. If additional nutrition issues arise, please re-consult RD.  Parks Ranger, MS, RDN, LDN 08/19/2017 4:05 PM

## 2017-08-19 NOTE — Progress Notes (Signed)
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  Was admitted with volume overload and progression of renal failure She is awaiting placement and discharge   Objective:  Vital signs in last 24 hours:  Temp:  [98.1 F (36.7 C)-98.3 F (36.8 C)] 98.1 F (36.7 C) (10/08 0440) Pulse Rate:  [99-114] 114 (10/08 0440) Resp:  [16] 16 (10/08 0440) BP: (146-167)/(65-68) 167/68 (10/08 0440) SpO2:  [92 %-95 %] 92 % (10/08 0440) Weight:  [142 lb (64.4 kg)] 142 lb (64.4 kg) (10/07 2039)  Weight change: -4 lb 10.1 oz (-2.1 kg) Filed Weights   08/17/17 2146 08/18/17 0755 08/18/17 2039  Weight: 142 lb 13.7 oz (64.8 kg) 142 lb 13.7 oz (64.8 kg) 142 lb (64.4 kg)    Intake/Output: I/O last 3 completed shifts: In: 483 [P.O.:480; I.V.:3] Out: 0    Intake/Output this shift:  No intake/output data recorded.     Gen  Alert non distressed appears comfortable  Lungs  Somewhat diminished at base     CVS    RRR  Abdomen soft, no focal tenderness Extremity  Trace pitting  Neuro: alert, Ox3, no focal deficit Dialysis Access : R   BC AVF  R IJ [facility]      Basic Metabolic Panel:  Recent Labs Lab 08/16/17 2045 08/17/17 0330 08/18/17 0239  NA  --  140 139  K  --  3.4* 3.4*  CL  --  108 103  CO2  --  16* 24  GLUCOSE  --  112* 103*  BUN  --  81* 33*  CREATININE 9.91* 9.72* 5.51*  CALCIUM  --  8.4* 8.3*  MG  --  1.6*  --   PHOS  --   --  6.9*    Liver Function Tests:  Recent Labs Lab 08/17/17 0330 08/18/17 0239  AST 24  --   ALT 25  --   ALKPHOS 51  --   BILITOT 0.6  --   PROT 5.9*  --   ALBUMIN 2.7* 2.4*   No results for input(s): LIPASE, AMYLASE in the last 168 hours. No results for input(s): AMMONIA in the last 168 hours.  CBC:  Recent Labs Lab 08/16/17 2045 08/17/17 1306  WBC 7.4 6.8  HGB 7.7* 7.8*  HCT 25.2* 25.4*  MCV 94.0 94.4   PLT 260 248    Cardiac Enzymes: No results for input(s): CKTOTAL, CKMB, CKMBINDEX, TROPONINI in the last 168 hours.  BNP: Invalid input(s): POCBNP  CBG: No results for input(s): GLUCAP in the last 168 hours.  Microbiology: Results for orders placed or performed during the hospital encounter of 08/16/17  Surgical pcr screen     Status: None   Collection Time: 08/17/17  8:13 AM  Result Value Ref Range Status   MRSA, PCR NEGATIVE NEGATIVE Final   Staphylococcus aureus NEGATIVE NEGATIVE Final    Comment: (NOTE) The Xpert SA Assay (FDA approved for NASAL specimens in patients 49 years of age and older), is one component of a comprehensive surveillance program. It is not intended to diagnose infection nor to guide or monitor treatment.     Coagulation Studies:  Recent Labs  08/16/17 2045  LABPROT 15.4*  INR 1.23    Urinalysis: No results for input(s): COLORURINE, LABSPEC, PHURINE, GLUCOSEU, HGBUR, BILIRUBINUR, KETONESUR, PROTEINUR, UROBILINOGEN, NITRITE, LEUKOCYTESUR in the last 72 hours.  Invalid input(s):  APPERANCEUR    Imaging: Dg Chest 1 View  Result Date: 08/17/2017 CLINICAL DATA:  Central line placement. EXAM: CHEST 1 VIEW COMPARISON:  08/16/2017 FINDINGS: Large bore dual-lumen right jugular central venous catheter with the tip projecting over the cavoatrial junction. Moderate left and small right pleural effusion. Bilateral interstitial and alveolar airspace opacities. No pneumothorax. Stable cardiomegaly. Thoracic aortic atherosclerosis. No acute osseous abnormality. IMPRESSION: 1. Large bore dual-lumen right jugular central venous catheter with the tip projecting over the cavoatrial junction. 2. Pulmonary edema. Electronically Signed   By: Kathreen Devoid   On: 08/17/2017 09:46   Dg Fluoro Guide Cv Line-no Report  Result Date: 08/17/2017 Fluoroscopy was utilized by the requesting physician.  No radiographic interpretation.     Medications:   . sodium chloride     . sodium chloride    . ferric gluconate (FERRLECIT/NULECIT) IV     . amLODipine  10 mg Oral Q breakfast  . atorvastatin  80 mg Oral Q breakfast  . calcitRIOL  1 mcg Oral Q T,Th,Sa-HD  . calcium acetate  667 mg Oral TID WC  . carvedilol  6.25 mg Oral BID  . darbepoetin (ARANESP) injection - DIALYSIS  200 mcg Intravenous Q Mon-HD  . heparin  5,000 Units Subcutaneous Q8H  . levothyroxine  75 mcg Oral QAC breakfast  . loratadine  10 mg Oral Q breakfast  . pantoprazole  40 mg Oral Daily  . 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 (R AVF 08/06/17)  Assessment/Recommendations  1. 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 Vascular access                  Will need evaluation and follow up for AVF ( 9/25) not apparently maturing as well as would be expected  3. Anemia                 Aranesp to be given dialysis 10/8 4. Secondary HPT- PTH 191 06/2017.                  Calcitriol 34mcg TTS   Will increase binder as phos increased    Calcium acetate  667 mg 2 q AC  5. HTN                               Appears relatively controlled will need to monitor 6. Hypothyroidism                         continue levothyroxine     LOS: 3 Anmol Fleck W   [phone]  @TODAY @9 :06 AM

## 2017-08-20 ENCOUNTER — Inpatient Hospital Stay (HOSPITAL_COMMUNITY): Payer: Medicare Other | Admitting: Anesthesiology

## 2017-08-20 ENCOUNTER — Encounter (HOSPITAL_COMMUNITY): Payer: Medicare Other

## 2017-08-20 ENCOUNTER — Encounter (HOSPITAL_COMMUNITY): Payer: Self-pay | Admitting: Orthopedic Surgery

## 2017-08-20 ENCOUNTER — Encounter (HOSPITAL_COMMUNITY)
Admission: AD | Disposition: A | Discharge status: Home / Self Care | Payer: Self-pay | Source: Other Acute Inpatient Hospital | Attending: Internal Medicine

## 2017-08-20 DIAGNOSIS — Z992 Dependence on renal dialysis: Secondary | ICD-10-CM

## 2017-08-20 DIAGNOSIS — N186 End stage renal disease: Secondary | ICD-10-CM

## 2017-08-20 HISTORY — PX: AV FISTULA PLACEMENT: SHX1204

## 2017-08-20 LAB — CBC
HEMATOCRIT: 22.7 % — AB (ref 36.0–46.0)
HEMOGLOBIN: 6.9 g/dL — AB (ref 12.0–15.0)
MCH: 29.2 pg (ref 26.0–34.0)
MCHC: 30.4 g/dL (ref 30.0–36.0)
MCV: 96.2 fL (ref 78.0–100.0)
Platelets: 202 10*3/uL (ref 150–400)
RBC: 2.36 MIL/uL — AB (ref 3.87–5.11)
RDW: 15.7 % — ABNORMAL HIGH (ref 11.5–15.5)
WBC: 7.6 10*3/uL (ref 4.0–10.5)

## 2017-08-20 LAB — BASIC METABOLIC PANEL
ANION GAP: 10 (ref 5–15)
BUN: 21 mg/dL — ABNORMAL HIGH (ref 6–20)
CALCIUM: 8.7 mg/dL — AB (ref 8.9–10.3)
CHLORIDE: 101 mmol/L (ref 101–111)
CO2: 26 mmol/L (ref 22–32)
Creatinine, Ser: 4.48 mg/dL — ABNORMAL HIGH (ref 0.44–1.00)
GFR calc Af Amer: 10 mL/min — ABNORMAL LOW (ref >60)
GFR calc non Af Amer: 9 mL/min — ABNORMAL LOW (ref >60)
GLUCOSE: 111 mg/dL — AB (ref 65–99)
Potassium: 3.7 mmol/L (ref 3.5–5.1)
Sodium: 137 mmol/L (ref 135–145)

## 2017-08-20 LAB — PROTIME-INR
INR: 1
PROTHROMBIN TIME: 13.1 s (ref 11.4–15.2)

## 2017-08-20 LAB — PREPARE RBC (CROSSMATCH)

## 2017-08-20 SURGERY — INSERTION OF ARTERIOVENOUS (AV) GORE-TEX GRAFT ARM
Anesthesia: Monitor Anesthesia Care | Laterality: Right

## 2017-08-20 MED ORDER — DEXAMETHASONE SODIUM PHOSPHATE 10 MG/ML IJ SOLN
INTRAMUSCULAR | Status: DC | PRN
  Administered 2017-08-20: 8 mg via INTRAVENOUS

## 2017-08-20 MED ORDER — LIDOCAINE HCL (PF) 1 % IJ SOLN
INTRAMUSCULAR | Status: DC | PRN
  Administered 2017-08-20: 30 mL
  Administered 2017-08-20: 20 mL

## 2017-08-20 MED ORDER — CALCIUM ACETATE (PHOS BINDER) 667 MG PO CAPS
1334.0000 mg | ORAL_CAPSULE | Freq: Three times a day (TID) | ORAL | Status: DC
  Administered 2017-08-20 – 2017-08-21 (×3): 1334 mg via ORAL
  Filled 2017-08-20 (×3): qty 2

## 2017-08-20 MED ORDER — PROPOFOL 10 MG/ML IV BOLUS
INTRAVENOUS | Status: AC
  Filled 2017-08-20: qty 20

## 2017-08-20 MED ORDER — PROTAMINE SULFATE 10 MG/ML IV SOLN
INTRAVENOUS | Status: AC
  Filled 2017-08-20: qty 5

## 2017-08-20 MED ORDER — SODIUM CHLORIDE 0.9 % IV SOLN
INTRAVENOUS | Status: DC | PRN
  Administered 2017-08-20: 11:00:00

## 2017-08-20 MED ORDER — DEXAMETHASONE SODIUM PHOSPHATE 10 MG/ML IJ SOLN
INTRAMUSCULAR | Status: AC
  Filled 2017-08-20: qty 1

## 2017-08-20 MED ORDER — FENTANYL CITRATE (PF) 100 MCG/2ML IJ SOLN
INTRAMUSCULAR | Status: DC | PRN
  Administered 2017-08-20 (×2): 25 ug via INTRAVENOUS

## 2017-08-20 MED ORDER — SODIUM CHLORIDE 0.9 % IV SOLN
INTRAVENOUS | Status: DC
Start: 2017-08-20 — End: 2017-08-22
  Administered 2017-08-20 (×3): via INTRAVENOUS

## 2017-08-20 MED ORDER — ROCURONIUM BROMIDE 10 MG/ML (PF) SYRINGE
PREFILLED_SYRINGE | INTRAVENOUS | Status: AC
  Filled 2017-08-20: qty 5

## 2017-08-20 MED ORDER — PROPOFOL 500 MG/50ML IV EMUL
INTRAVENOUS | Status: DC | PRN
  Administered 2017-08-20: 65 ug/kg/min via INTRAVENOUS

## 2017-08-20 MED ORDER — ONDANSETRON HCL 4 MG/2ML IJ SOLN
INTRAMUSCULAR | Status: AC
  Filled 2017-08-20: qty 2

## 2017-08-20 MED ORDER — PHENYLEPHRINE HCL 10 MG/ML IJ SOLN
INTRAVENOUS | Status: DC | PRN
  Administered 2017-08-20: 10 ug/min via INTRAVENOUS

## 2017-08-20 MED ORDER — HEPARIN SODIUM (PORCINE) 1000 UNIT/ML IJ SOLN
INTRAMUSCULAR | Status: DC | PRN
  Administered 2017-08-20: 3000 [IU] via INTRAVENOUS

## 2017-08-20 MED ORDER — LIDOCAINE HCL (PF) 1 % IJ SOLN
INTRAMUSCULAR | Status: AC
  Filled 2017-08-20: qty 30

## 2017-08-20 MED ORDER — 0.9 % SODIUM CHLORIDE (POUR BTL) OPTIME
TOPICAL | Status: DC | PRN
  Administered 2017-08-20: 1000 mL

## 2017-08-20 MED ORDER — ONDANSETRON HCL 4 MG/2ML IJ SOLN
INTRAMUSCULAR | Status: DC | PRN
Start: 2017-08-20 — End: 2017-08-20
  Administered 2017-08-20: 4 mg via INTRAVENOUS

## 2017-08-20 MED ORDER — PROPOFOL 10 MG/ML IV BOLUS
INTRAVENOUS | Status: DC | PRN
  Administered 2017-08-20: 30 mg via INTRAVENOUS

## 2017-08-20 MED ORDER — LIDOCAINE HCL 1 % IJ SOLN
INTRAMUSCULAR | Status: AC
  Filled 2017-08-20: qty 20

## 2017-08-20 MED ORDER — SODIUM CHLORIDE 0.9 % IV SOLN: Freq: Once | INTRAVENOUS | Status: DC

## 2017-08-20 MED ORDER — FENTANYL CITRATE (PF) 250 MCG/5ML IJ SOLN
INTRAMUSCULAR | Status: AC
  Filled 2017-08-20: qty 5

## 2017-08-20 SURGICAL SUPPLY — 37 items
ADH SKN CLS APL DERMABOND .7 (GAUZE/BANDAGES/DRESSINGS) ×1
AGENT HMST SPONGE THK3/8 (HEMOSTASIS)
ARMBAND PINK RESTRICT EXTREMIT (MISCELLANEOUS) ×3 IMPLANT
CANISTER SUCT 3000ML PPV (MISCELLANEOUS) ×2 IMPLANT
CANNULA VESSEL 3MM 2 BLNT TIP (CANNULA) ×2 IMPLANT
CLIP VESOCCLUDE MED 6/CT (CLIP) ×2 IMPLANT
CLIP VESOCCLUDE SM WIDE 6/CT (CLIP) ×2 IMPLANT
DECANTER SPIKE VIAL GLASS SM (MISCELLANEOUS) ×2 IMPLANT
DERMABOND ADVANCED (GAUZE/BANDAGES/DRESSINGS) ×1
DERMABOND ADVANCED .7 DNX12 (GAUZE/BANDAGES/DRESSINGS) ×1 IMPLANT
ELECT REM PT RETURN 9FT ADLT (ELECTROSURGICAL) ×2
ELECTRODE REM PT RTRN 9FT ADLT (ELECTROSURGICAL) ×1 IMPLANT
GLOVE BIO SURGEON STRL SZ7.5 (GLOVE) ×2 IMPLANT
GLOVE BIOGEL PI IND STRL 6.5 (GLOVE) IMPLANT
GLOVE BIOGEL PI INDICATOR 6.5 (GLOVE) ×3
GLOVE ECLIPSE 6.5 STRL STRAW (GLOVE) ×1 IMPLANT
GLOVE SURG 8.5 LATEX PF (GLOVE) ×1 IMPLANT
GLOVE SURG SS PI 7.0 STRL IVOR (GLOVE) ×1 IMPLANT
GLOVE SURG SS PI 7.5 STRL IVOR (GLOVE) ×1 IMPLANT
GOWN STRL REUS W/ TWL LRG LVL3 (GOWN DISPOSABLE) ×3 IMPLANT
GOWN STRL REUS W/ TWL XL LVL3 (GOWN DISPOSABLE) IMPLANT
GOWN STRL REUS W/TWL LRG LVL3 (GOWN DISPOSABLE) ×4
GOWN STRL REUS W/TWL XL LVL3 (GOWN DISPOSABLE) ×6
GRAFT GORETEX STRT 4-7X45 (Vascular Products) ×1 IMPLANT
HEMOSTAT SPONGE AVITENE ULTRA (HEMOSTASIS) IMPLANT
KIT BASIN OR (CUSTOM PROCEDURE TRAY) ×2 IMPLANT
KIT ROOM TURNOVER OR (KITS) ×2 IMPLANT
NS IRRIG 1000ML POUR BTL (IV SOLUTION) ×2 IMPLANT
PACK CV ACCESS (CUSTOM PROCEDURE TRAY) ×2 IMPLANT
PAD ARMBOARD 7.5X6 YLW CONV (MISCELLANEOUS) ×4 IMPLANT
SUT PROLENE 6 0 CC (SUTURE) ×6 IMPLANT
SUT VIC AB 3-0 SH 27 (SUTURE) ×4
SUT VIC AB 3-0 SH 27X BRD (SUTURE) ×2 IMPLANT
SUT VICRYL 4-0 PS2 18IN ABS (SUTURE) ×4 IMPLANT
SYR TOOMEY 50ML (SYRINGE) IMPLANT
UNDERPAD 30X30 (UNDERPADS AND DIAPERS) ×3 IMPLANT
WATER STERILE IRR 1000ML POUR (IV SOLUTION) ×2 IMPLANT

## 2017-08-20 NOTE — Anesthesia Preprocedure Evaluation (Addendum)
Anesthesia Evaluation  Patient identified by MRN, date of birth, ID band Patient awake    Reviewed: Allergy & Precautions, NPO status , Patient's Chart, lab work & pertinent test results, reviewed documented beta blocker date and time   History of Anesthesia Complications Negative for: history of anesthetic complications  Airway Mallampati: II  TM Distance: >3 FB Neck ROM: Full    Dental  (+) Edentulous Upper, Edentulous Lower   Pulmonary former smoker,    breath sounds clear to auscultation       Cardiovascular hypertension, Pt. on medications and Pt. on home beta blockers (-) angina Rhythm:Regular Rate:Normal     Neuro/Psych negative neurological ROS     GI/Hepatic Neg liver ROS, GERD  Medicated and Controlled,  Endo/Other  Hypothyroidism   Renal/GU ESRFRenal disease (K+ 3.7, last dialysis yesterday )     Musculoskeletal   Abdominal   Peds  Hematology  (+) Blood dyscrasia (Hb 6.9), anemia ,   Anesthesia Other Findings   Reproductive/Obstetrics                            Anesthesia Physical Anesthesia Plan  ASA: III  Anesthesia Plan: MAC   Post-op Pain Management:    Induction:   PONV Risk Score and Plan: 2 and Ondansetron, Dexamethasone and Treatment may vary due to age or medical condition  Airway Management Planned: Natural Airway and Simple Face Mask  Additional Equipment:   Intra-op Plan:   Post-operative Plan:   Informed Consent: I have reviewed the patients History and Physical, chart, labs and discussed the procedure including the risks, benefits and alternatives for the proposed anesthesia with the patient or authorized representative who has indicated his/her understanding and acceptance.     Plan Discussed with: CRNA and Surgeon  Anesthesia Plan Comments: (Plan routine monitors, MAC Pt is long-term DNR, but understands may need resuscitative meds and equipment  peri-op. Pt agrees for DNR suspension peri-op)       Anesthesia Quick Evaluation

## 2017-08-20 NOTE — Progress Notes (Signed)
Orders received to transfuse patient with 1 unit of PRBC, however previous type and screen results expired. Phlebotomy notified patient needs type and screen to be drawn before nurse can transfuse patient with PRBC. Will continue to monitor patient.

## 2017-08-20 NOTE — Transfer of Care (Signed)
Immediate Anesthesia Transfer of Care Note  Patient: Latoya Lopez  Procedure(s) Performed: INSERTION OF ARTERIOVENOUS (AV) GORE-TEX STRETCH GRAFT INTO RIGHT ARM (Right )  Patient Location: PACU  Anesthesia Type:MAC  Level of Consciousness: awake, alert  and oriented  Airway & Oxygen Therapy: Patient Spontanous Breathing  Post-op Assessment: Report given to RN, Post -op Vital signs reviewed and stable, Patient moving all extremities and Patient moving all extremities X 4  Post vital signs: Reviewed and stable  Last Vitals:  Vitals:   08/20/17 0824 08/20/17 0839  BP: (!) 180/74 (!) 151/70  Pulse: (!) 114 (!) 111  Resp: 16 16  Temp: 37.1 C 37.1 C  SpO2: 96% 94%    Last Pain:  Vitals:   08/20/17 0839  TempSrc: Oral  PainSc:       Patients Stated Pain Goal: 0 (25/36/64 4034)  Complications: No apparent anesthesia complications

## 2017-08-20 NOTE — Anesthesia Postprocedure Evaluation (Signed)
Anesthesia Post Note  Patient: Latoya Lopez  Procedure(s) Performed: INSERTION OF ARTERIOVENOUS (AV) GORE-TEX STRETCH GRAFT INTO RIGHT ARM (Right )     Patient location during evaluation: PACU Anesthesia Type: MAC Level of consciousness: awake and alert, patient cooperative and oriented Pain management: pain level controlled Vital Signs Assessment: post-procedure vital signs reviewed and stable Respiratory status: nonlabored ventilation, spontaneous breathing and respiratory function stable Cardiovascular status: blood pressure returned to baseline and stable Postop Assessment: no apparent nausea or vomiting Anesthetic complications: no    Last Vitals:  Vitals:   08/20/17 1313 08/20/17 1807  BP: (!) 176/67 (!) 174/69  Pulse: 99 98  Resp: 20 20  Temp: 37 C 37.1 C  SpO2: 92% 93%    Last Pain:  Vitals:   08/20/17 1807  TempSrc: Oral  PainSc:                  Paytan Recine,E. Krista Som

## 2017-08-20 NOTE — H&P (View-Only) (Signed)
Patient ID: Latoya Lopez, female   DOB: 09-Aug-1941, 76 y.o.   MRN: [id number] If further bleeding around her catheter. She did have successful dialysis again today. Reviewed her vein map from prior to her right upper arm AV fistula showing very small surface veins bilaterally. Would not recommend further attempts at fistula salvage. Recommend right arm AV graft and we will schedule this tomorrow with Dr. Oneida Alar

## 2017-08-20 NOTE — Interval H&P Note (Signed)
History and Physical Interval Note:  08/20/2017 10:14 AM  Latoya Lopez  has presented today for surgery, with the diagnosis of end stage renal disease  The various methods of treatment have been discussed with the patient and family. After consideration of risks, benefits and other options for treatment, the patient has consented to  Procedure(s): INSERTION OF ARTERIOVENOUS (AV) GORE-TEX GRAFT ARM (Right) as a surgical intervention .  [name] history has been reviewed, patient examined, no change in status, stable for surgery.  I have reviewed the patient's chart and labs.  Questions were answered to the patient's satisfaction.     Ruta Hinds

## 2017-08-20 NOTE — Anesthesia Procedure Notes (Signed)
Procedure Name: MAC Date/Time: 08/20/2017 10:57 AM Performed by: Izora Gala Pre-anesthesia Checklist: Patient identified Patient Re-evaluated:Patient Re-evaluated prior to induction Oxygen Delivery Method: Simple face mask Induction Type: IV induction Placement Confirmation: positive ETCO2

## 2017-08-20 NOTE — Op Note (Signed)
Procedure: Right Upper Arm AV graft  Preop: ESRD  Postop: ESRD  Asst: Linus Orn  Anesthesia: General  Findings:4-7 mm PTFE end to side to axillary vein   Procedure Details: The right upper extremity was prepped and draped in usual sterile fashion.  A longitudinal incision was then made near the antecubital crease the right arm.  There were no suitable antecubital veins for outflow.   The incision was carried into the subcutaneous tissues down to level of the brachial artery.  Next the brachial artery was dissected free in the medial portion incision. The artery was  3 mm in diameter. The vessel loops were placed proximal and distal to the planned site of arteriotomy. At this point, a longitudinal incision was made in the axilla and carried through the subcutaneous tissues and fascia to expose the axillary vein.  The nerves were protected.  The vein was approximately 3.5 mm in diameter. Next, a subcutaneous tunnel was created connecting the upper arm to the lower arm incision in an arcing configuration over the biceps muscle.  A 4-7 mm PTFE graft was then brought through this subcutaneous tunnel. The patient was given 3000 units of intravenous heparin. After appropriate circulation time, the vessel loops were used to control the artery. A longitudinal opening was made in the right brachial artery.  The 4 mm end of the graft was beveled and sewn end to side to the artery using a 6 0 prolene.  At completion of the anastomosis the artery was forward bled, backbled and thoroughly flushed.  The anastomosis was secured, vessel loops were released and there was palpable pulse in the graft.  The graft was clamped just above the arterial anastomosis with a fistula clamp. The graft was then pulled taut to length at the axillary incision.  The axillary vein was controlled with a fine bulldog clamp in the upper axilla proximally and distally.  The vein was opened longitudinally.  The distal end of the graft was  then beveled and sewn end to side to the vein using a running 6 0 prolene.  Just prior to completion of the anastomosis, everything was forward bled, back bled and thoroughly flushed.  The anastomosis was secured and the fistula clamp removed from the proximal graft.  A thrill was immediately palpable in the graft. After hemostasis was obtained, the subcutaneous tissues were reapproximated using a running 3-0 Vicryl suture. The skin was then closed with a 4 0 Vicryl subcuticular stitch. Dermabond was applied to the skin incisions.  The patient tolerated the procedure well and there were no complications.  Instrument sponge and needle count was correct at the end of the case.  The patient was taken to the recovery room in stable condition. The patient had a palpable radial pulse at the end of the case.  Ruta Hinds, MD [facility] Office: [phone] Pager: [phone]

## 2017-08-20 NOTE — Progress Notes (Signed)
CRITICAL VALUE ALERT  Critical Value:hemoglobin 6.9  Date & Time Notied: 08/20/2017 0458 Provider Notified: NP K. Schorr Orders Received/Actions taken:Awaiting response

## 2017-08-20 NOTE — Progress Notes (Signed)
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  Was admitted with volume overload and progression of renal failure She is awaiting placement and discharge  Objective:  Vital signs in last 24 hours:  Temp:  [98 F (36.7 C)-99.1 F (37.3 C)] 98.8 F (37.1 C) (10/09 0839) Pulse Rate:  [79-120] 111 (10/09 0839) Resp:  [16-20] 16 (10/09 0839) BP: (72-183)/(37-82) 151/70 (10/09 0839) SpO2:  [91 %-97 %] 94 % (10/09 0839) Weight:  [138 lb 14.2 oz (63 kg)-143 lb 1.3 oz (64.9 kg)] 141 lb (64 kg) (10/08 2231)  Weight change: 3.5 oz (0.1 kg) Filed Weights   08/19/17 1030 08/19/17 1415 08/19/17 2231  Weight: 143 lb 1.3 oz (64.9 kg) 138 lb 14.2 oz (63 kg) 141 lb (64 kg)    Intake/Output: I/O last 3 completed shifts: In: 1062 [P.O.:720; NG/GT:237; IV Piggyback:105] Out: 4081 [Other:1660]   Intake/Output this shift:  No intake/output data recorded.  Gen  Alert non distressed appears comfortable  Lungs  Somewhat diminished at base     CVS    RRR  Abdomen soft, no focal tenderness Extremity  Trace pitting  Neuro: alert, Ox3, no focal deficit Dialysis Access : R   [facility] AVF   Basic Metabolic Panel:  Recent Labs Lab 08/16/17 2045  08/17/17 0330 08/18/17 0239 08/19/17 1200 08/20/17 0340  NA  --   --  140 139 135 137  K  --   --  3.4* 3.4* 3.1* 3.7  CL  --   --  108 103 101 101  CO2  --   --  16* 24 25 26   GLUCOSE  --   --  112* 103* 117* 111*  BUN  --   --  81* 33* 32* 21*  CREATININE 9.91*  --  9.72* 5.51* 5.95* 4.48*  CALCIUM  --   < > 8.4* 8.3* 8.4* 8.7*  MG  --   --  1.6*  --   --   --   PHOS  --   --   --  6.9* 6.0*  --   < > = values in this interval not displayed.  Liver Function Tests:  Recent Labs Lab 08/17/17 0330 08/18/17 0239 08/19/17 1200  AST 24  --   --   ALT 25  --   --   ALKPHOS 51  --   --   BILITOT 0.6  --   --   PROT  5.9*  --   --   ALBUMIN 2.7* 2.4* 2.5*   No results for input(s): LIPASE, AMYLASE in the last 168 hours. No results for input(s): AMMONIA in the last 168 hours.  CBC:  Recent Labs Lab 08/16/17 2045 08/17/17 1306 08/19/17 1200 08/20/17 0340  WBC 7.4 6.8 5.2 7.6  HGB 7.7* 7.8* 7.0* 6.9*  HCT 25.2* 25.4* 22.5* 22.7*  MCV 94.0 94.4 93.4 96.2  PLT 260 248 214 202    Cardiac Enzymes: No results for input(s): CKTOTAL, CKMB, CKMBINDEX, TROPONINI in the last 168 hours.  BNP: Invalid input(s): POCBNP  CBG: No results for input(s): GLUCAP in the last 168 hours.  Microbiology: Results for orders placed or performed during the hospital encounter of 08/16/17  Surgical pcr screen     Status: None   Collection Time: 08/17/17  8:13 AM  Result Value Ref Range Status   MRSA, PCR NEGATIVE  NEGATIVE Final   Staphylococcus aureus NEGATIVE NEGATIVE Final    Comment: (NOTE) The Xpert SA Assay (FDA approved for NASAL specimens in patients 41 years of age and older), is one component of a comprehensive surveillance program. It is not intended to diagnose infection nor to guide or monitor treatment.     Coagulation Studies:  Recent Labs  08/20/17 0340  LABPROT 13.1  INR 1.00    Urinalysis: No results for input(s): COLORURINE, LABSPEC, PHURINE, GLUCOSEU, HGBUR, BILIRUBINUR, KETONESUR, PROTEINUR, UROBILINOGEN, NITRITE, LEUKOCYTESUR in the last 72 hours.  Invalid input(s): APPERANCEUR    Imaging: No results found.   Medications:   . sodium chloride    . sodium chloride    . sodium chloride    .  ceFAZolin (ANCEF) IV    . ferric gluconate (FERRLECIT/NULECIT) IV 62.5 mg (08/19/17 1251)   . amLODipine  10 mg Oral Q breakfast  . atorvastatin  80 mg Oral Q breakfast  . calcitRIOL  1 mcg Oral Q M,W,F-HD  . calcium acetate  667 mg Oral TID WC  . carvedilol  6.25 mg Oral BID  . darbepoetin (ARANESP) injection - DIALYSIS  200 mcg Intravenous Q Mon-HD  . feeding supplement (NEPRO  CARB STEADY)  237 mL Oral Q24H  . heparin  5,000 Units Subcutaneous Q8H  . levothyroxine  75 mcg Oral QAC breakfast  . loratadine  10 mg Oral Q breakfast  . pantoprazole  40 mg Oral Daily  . 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 (R AVF 08/06/17)  Assessment/Recommendations  1. 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 Vascular access                  Will need evaluation and follow up for AVF ( 9/25) not apparently maturing as well as would be expected  3. 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                  Calcitriol 4mcg TTS   Will increase binder as phos increased    Calcium acetate  667 mg 2 q AC will continue to monitor 5. HTN                               Appears relatively controlled will need to monitor 6. Hypothyroidism                         continue levothyroxine   LOS: 4 Bettylee Feig W @TODAY @9 :43 AM

## 2017-08-20 NOTE — Care Management Important Message (Signed)
Important Message  Patient Details  Name: Latoya Lopez MRN: [id number] Date of Birth: 1941-03-18   Medicare Important Message Given:  Yes    Demitrious Mccannon Abena 08/20/2017, 9:07 AM

## 2017-08-20 NOTE — Progress Notes (Signed)
Pt transported to Pre-op with unit of PRBCs still infusing. Pre-op RN aware.

## 2017-08-20 NOTE — Progress Notes (Signed)
PROGRESS NOTE    [name]   JJK:[id number]  DOB: 11/19/1940  DOA: 08/16/2017 PCP: [place]   Brief Narrative:  Latoya Lopez  is a 76 y.o. female with HLD, hypothyroidism, membranous nephropathymedical history significant of see below presented to [facility] for every 2 wk epo injection. She complained of dyspnea and was found to have a pulse ox 80%, CXR with pleural effusions, BUN 77, Cr 9.6.  Transferred to [facility] for dialysis.    Subjective: No complaints.  ROS: no complaints of nausea, vomiting, constipation diarrhea, cough, dyspnea or dysuria. No other complaints.   Assessment & Plan:   Principal Problem:   ESRD (end stage renal disease)  - had a right upper arm fistula placed recently as outpt but is not maturing- graft done today - 10/6 - dialysis cath placed and patient dialyzed  -evening of 10/6 through 10/7 - slow bleeding from dialysis cath site occurred and was controlled with a pressure dressing and suturing-  Active Problems:   Acute congestive heart failure, unspecified    Pleural effusion -  d/'c  Lasix - fluid management with dialysis  Anemia of chronic disease -  Aranesp given -  Iron being replaced by nephrology - Transfusing 1 U PRBC      HTN (hypertension), benign - Norvasc, Coreg - follow BP with dialysis- Dinamap BP readings high- manual BP is  148/62 yesterday    Hypothyroidism  - synthroid   HLD - Lipitor DVT prophylaxis: Heparin Code Status: DNR Family Communication:  Disposition Plan: cont to follow for CLIPPING Consultants:   nephrology  vasc surgery Procedures:   Right IJ HD cath Antimicrobials:  Anti-infectives    Start     Dose/Rate Route Frequency Ordered Stop   08/20/17 0800  ceFAZolin (ANCEF) IVPB 2g/100 mL premix    Comments:  Send with pt to OR   2 g 200 mL/hr over 30 Minutes Intravenous To Surgery 08/19/17 1554 08/20/17 1120   08/18/17 0600  cefUROXime (ZINACEF) 1.5 g  in dextrose 5 % 50 mL IVPB     1.5 g 100 mL/hr over 30 Minutes Intravenous On call to O.R. 08/17/17 0748 08/17/17 0858       Objective: Vitals:   08/20/17 0954 08/20/17 1230 08/20/17 1241 08/20/17 1313  BP:  (!) 168/74 (!) 168/73 (!) 176/67  Pulse:   (!) 102 99  Resp:  18 20 20   Temp:  (!) 97.4 F (36.3 C)  98.6 F (37 C)  TempSrc:    Oral  SpO2:   92% 92%  Weight: 64 kg (141 lb)     Height: 5' 9.02" (1.753 m)       Intake/Output Summary (Last 24 hours) at 08/20/17 1717 Last data filed at 08/20/17 1436  Gross per 24 hour  Intake             1439 ml  Output               30 ml  Net             1409 ml   Filed Weights   08/19/17 1415 08/19/17 2231 08/20/17 0954  Weight: 63 kg (138 lb 14.2 oz) 64 kg (141 lb) 64 kg (141 lb)    Examination: General exam: Appears comfortable  HEENT: PERRLA, oral mucosa moist, no sclera icterus or thrush Respiratory system: Clear to auscultation. Respiratory effort normal.pulse ox 94% on room air Cardiovascular system: S1 & S2 heard, RRR.  2/6 murmur at RUS border Gastrointestinal system: Abdomen soft, non-tender, nondistended. Normal bowel sound. No organomegaly Central nervous system: Alert and oriented. No focal neurological deficits. Extremities: No cyanosis, clubbing - no pedal edema Skin: No rashes or ulcers Psychiatry:  Mood & affect appropriate.     Data Reviewed: I have personally reviewed following labs and imaging studies  CBC:  Recent Labs Lab 08/16/17 2045 08/17/17 1306 08/19/17 1200 08/20/17 0340  WBC 7.4 6.8 5.2 7.6  HGB 7.7* 7.8* 7.0* 6.9*  HCT 25.2* 25.4* 22.5* 22.7*  MCV 94.0 94.4 93.4 96.2  PLT 260 248 214 169   Basic Metabolic Panel:  Recent Labs Lab 08/16/17 2045 08/17/17 0330 08/18/17 0239 08/19/17 1200 08/20/17 0340  NA  --  140 139 135 137  K  --  3.4* 3.4* 3.1* 3.7  CL  --  108 103 101 101  CO2  --  16* 24 25 26   GLUCOSE  --  112* 103* 117* 111*  BUN  --  81* 33* 32* 21*  CREATININE 9.91*  9.72* 5.51* 5.95* 4.48*  CALCIUM  --  8.4* 8.3* 8.4* 8.7*  MG  --  1.6*  --   --   --   PHOS  --   --  6.9* 6.0*  --    GFR: Estimated Creatinine Clearance: 11.1 mL/min (A) (by C-G formula based on SCr of 4.48 mg/dL (H)). Liver Function Tests:  Recent Labs Lab 08/17/17 0330 08/18/17 0239 08/19/17 1200  AST 24  --   --   ALT 25  --   --   ALKPHOS 51  --   --   BILITOT 0.6  --   --   PROT 5.9*  --   --   ALBUMIN 2.7* 2.4* 2.5*   No results for input(s): LIPASE, AMYLASE in the last 168 hours. No results for input(s): AMMONIA in the last 168 hours. Coagulation Profile:  Recent Labs Lab 08/16/17 2045 08/20/17 0340  INR 1.23 1.00   Cardiac Enzymes: No results for input(s): CKTOTAL, CKMB, CKMBINDEX, TROPONINI in the last 168 hours. BNP (last 3 results) No results for input(s): PROBNP in the last 8760 hours. HbA1C: No results for input(s): HGBA1C in the last 72 hours. CBG: No results for input(s): GLUCAP in the last 168 hours. Lipid Profile: No results for input(s): CHOL, HDL, LDLCALC, TRIG, CHOLHDL, LDLDIRECT in the last 72 hours. Thyroid Function Tests: No results for input(s): TSH, T4TOTAL, FREET4, T3FREE, THYROIDAB in the last 72 hours. Anemia Panel: No results for input(s): VITAMINB12, FOLATE, FERRITIN, TIBC, IRON, RETICCTPCT in the last 72 hours. Urine analysis: No results found for: COLORURINE, APPEARANCEUR, LABSPEC, PHURINE, GLUCOSEU, HGBUR, BILIRUBINUR, KETONESUR, PROTEINUR, UROBILINOGEN, NITRITE, LEUKOCYTESUR Sepsis Labs: @LABRCNTIP (procalcitonin:4,lacticidven:4) ) Recent Results (from the past 240 hour(s))  Surgical pcr screen     Status: None   Collection Time: 08/17/17  8:13 AM  Result Value Ref Range Status   MRSA, PCR NEGATIVE NEGATIVE Final   Staphylococcus aureus NEGATIVE NEGATIVE Final    Comment: (NOTE) The Xpert SA Assay (FDA approved for NASAL specimens in patients 110 years of age and older), is one component of a comprehensive surveillance  program. It is not intended to diagnose infection nor to guide or monitor treatment.          Radiology Studies: No results found.    Scheduled Meds: . amLODipine  10 mg Oral Q breakfast  . atorvastatin  80 mg Oral Q breakfast  . calcitRIOL  1 mcg Oral Q  M,W,F-HD  . calcium acetate  1,334 mg Oral TID WC  . carvedilol  6.25 mg Oral BID  . darbepoetin (ARANESP) injection - DIALYSIS  200 mcg Intravenous Q Mon-HD  . feeding supplement (NEPRO CARB STEADY)  237 mL Oral Q24H  . heparin  5,000 Units Subcutaneous Q8H  . levothyroxine  75 mcg Oral QAC breakfast  . loratadine  10 mg Oral Q breakfast  . pantoprazole  40 mg Oral Daily  . sodium chloride flush  3 mL Intravenous Q12H   Continuous Infusions: . sodium chloride    . sodium chloride    . sodium chloride    . sodium chloride 10 mL/hr at 08/20/17 0957  . ferric gluconate (FERRLECIT/NULECIT) IV 62.5 mg (08/19/17 1251)     LOS: 4 days    Time spent in minutes: 35    Debbe Odea, MD [facility] Pager: [email or web address] Password [email or web address] 08/20/2017, 5:17 PM

## 2017-08-21 ENCOUNTER — Encounter (HOSPITAL_COMMUNITY): Payer: Self-pay | Admitting: Vascular Surgery

## 2017-08-21 DIAGNOSIS — K219 Gastro-esophageal reflux disease without esophagitis: Secondary | ICD-10-CM | POA: Insufficient documentation

## 2017-08-21 DIAGNOSIS — I509 Heart failure, unspecified: Secondary | ICD-10-CM | POA: Insufficient documentation

## 2017-08-21 LAB — BPAM RBC
Blood Product Expiration Date: 201810162359
ISSUE DATE / TIME: 201810090812
Unit Type and Rh: 5100

## 2017-08-21 LAB — RENAL FUNCTION PANEL
ANION GAP: 12 (ref 5–15)
Albumin: 2.8 g/dL — ABNORMAL LOW (ref 3.5–5.0)
BUN: 35 mg/dL — ABNORMAL HIGH (ref 6–20)
CO2: 20 mmol/L — AB (ref 22–32)
Calcium: 9.2 mg/dL (ref 8.9–10.3)
Chloride: 102 mmol/L (ref 101–111)
Creatinine, Ser: 6.4 mg/dL — ABNORMAL HIGH (ref 0.44–1.00)
GFR calc non Af Amer: 6 mL/min — ABNORMAL LOW (ref >60)
GFR, EST AFRICAN AMERICAN: 7 mL/min — AB (ref >60)
GLUCOSE: 230 mg/dL — AB (ref 65–99)
POTASSIUM: 3.9 mmol/L (ref 3.5–5.1)
Phosphorus: 5.3 mg/dL — ABNORMAL HIGH (ref 2.5–4.6)
Sodium: 134 mmol/L — ABNORMAL LOW (ref 135–145)

## 2017-08-21 LAB — TYPE AND SCREEN
ABO/RH(D): O POS
Antibody Screen: NEGATIVE
Unit division: 0

## 2017-08-21 LAB — CBC
HEMATOCRIT: 25.4 % — AB (ref 36.0–46.0)
HEMOGLOBIN: 7.8 g/dL — AB (ref 12.0–15.0)
MCH: 29.1 pg (ref 26.0–34.0)
MCHC: 30.7 g/dL (ref 30.0–36.0)
MCV: 94.8 fL (ref 78.0–100.0)
Platelets: 199 10*3/uL (ref 150–400)
RBC: 2.68 MIL/uL — AB (ref 3.87–5.11)
RDW: 15.6 % — ABNORMAL HIGH (ref 11.5–15.5)
WBC: 6.3 10*3/uL (ref 4.0–10.5)

## 2017-08-21 MED ORDER — CALCITRIOL 0.25 MCG PO CAPS: 0.5000 ug | ORAL_CAPSULE | ORAL | Status: DC

## 2017-08-21 MED ORDER — CALCIUM ACETATE (PHOS BINDER) 667 MG PO CAPS: 1334.0000 mg | ORAL_CAPSULE | Freq: Three times a day (TID) | ORAL | 0 refills | Status: DC

## 2017-08-21 NOTE — Progress Notes (Addendum)
[facility]  Subjective  - Doing well with out new complaints.   Objective (!) 150/74 (!) 101 97.9 F (36.6 C) 15 95%  Intake/Output Summary (Last 24 hours) at 08/21/17 0819 Last data filed at 08/21/17 0600  Gross per 24 hour  Intake              962 ml  Output               30 ml  Net              932 ml    Palpable thrill left AV graft, palpable radial pulse Moderate ecchymosis Incision is healing well   Assessment/Planning: POD # 1 left AV graft placement  Do not stick graft for 4 weeks F/U PRN  Theda Sers EMMA MAUREEN 08/21/2017 8:19 AM -- Agree with above.  No signs of steal + thrill in graft Ok to stick in 1 month  Ruta Hinds, MD [facility] [place]: [phone] Pager: [phone]  Laboratory Lab Results:  Recent Labs  08/20/17 0340 08/21/17 0210  WBC 7.6 6.3  HGB 6.9* 7.8*  HCT 22.7* 25.4*  PLT 202 199   BMET  Recent Labs  08/19/17 1200 08/20/17 0340  NA 135 137  K 3.1* 3.7  CL 101 101  CO2 25 26  GLUCOSE 117* 111*  BUN 32* 21*  CREATININE 5.95* 4.48*  CALCIUM 8.4* 8.7*    COAG Lab Results  Component Value Date   INR 1.00 08/20/2017   INR 1.23 08/16/2017   No results found for: PTT

## 2017-08-21 NOTE — Progress Notes (Signed)
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 Was admitted with volume overload and progression of renal failure She is awaiting placement and discharge   AVG placed 10/9  She can dialyze in [place] [facility]  2nd shift  Objective:  Vital signs in last 24 hours:  Temp:  [97.4 F (36.3 C)-98.8 F (37.1 C)] 98.8 F (37.1 C) (10/10 1000) Pulse Rate:  [98-120] 99 (10/10 1000) Resp:  [15-20] 18 (10/10 1000) BP: (150-182)/(67-80) 182/80 (10/10 1000) SpO2:  [92 %-98 %] 98 % (10/10 1000) FiO2 (%):  [7 %] 7 % (10/10 1000) Weight:  [141 lb 1.5 oz (64 kg)] 141 lb 1.5 oz (64 kg) (10/10 0519)  Weight change: -2 lb 1.3 oz (-0.943 kg) Filed Weights   08/19/17 2231 08/20/17 0954 08/21/17 0519  Weight: 141 lb (64 kg) 141 lb (64 kg) 141 lb 1.5 oz (64 kg)    Intake/Output: I/O last 3 completed shifts: In: 21 [P.O.:120; I.V.:500; Blood:342] Out: 30 [Blood:30]   Intake/Output this shift:  Total I/O In: 3 [I.V.:3] Out: -   Gen Alert non distressed appears comfortable  Lungs Somewhat diminished at base  CVS RRR  Abdomen soft, no focal tenderness Extremity Trace pitting  Neuro: alert, Ox3, no focal deficit Dialysis Access : R AVG right arm   Basic Metabolic Panel:  Recent Labs Lab 08/16/17 2045  08/17/17 0330 08/18/17 0239 08/19/17 1200 08/20/17 0340  NA  --   --  140 139 135 137  K  --   --  3.4* 3.4* 3.1* 3.7  CL  --   --  108 103 101 101  CO2  --   --  16* 24 25 26   GLUCOSE  --   --  112* 103* 117* 111*  BUN  --   --  81* 33* 32* 21*  CREATININE 9.91*  --  9.72* 5.51* 5.95* 4.48*  CALCIUM  --   < > 8.4* 8.3* 8.4* 8.7*  MG  --   --  1.6*  --   --   --   PHOS  --   --   --  6.9* 6.0*  --   < > = values in this interval not displayed.  Liver Function Tests:  Recent Labs Lab 08/17/17 0330 08/18/17 0239 08/19/17 1200  AST 24  --    --   ALT 25  --   --   ALKPHOS 51  --   --   BILITOT 0.6  --   --   PROT 5.9*  --   --   ALBUMIN 2.7* 2.4* 2.5*   No results for input(s): LIPASE, AMYLASE in the last 168 hours. No results for input(s): AMMONIA in the last 168 hours.  CBC:  Recent Labs Lab 08/16/17 2045 08/17/17 1306 08/19/17 1200 08/20/17 0340 08/21/17 0210  WBC 7.4 6.8 5.2 7.6 6.3  HGB 7.7* 7.8* 7.0* 6.9* 7.8*  HCT 25.2* 25.4* 22.5* 22.7* 25.4*  MCV 94.0 94.4 93.4 96.2 94.8  PLT 260 248 214 202 199    Cardiac Enzymes: No results for input(s): CKTOTAL, CKMB, CKMBINDEX, TROPONINI in the last 168 hours.  BNP: Invalid input(s): POCBNP  CBG: No results for input(s): GLUCAP in the last 168 hours.  Microbiology: Results for orders placed or performed during the hospital encounter of 08/16/17  Surgical pcr screen     Status:  None   Collection Time: 08/17/17  8:13 AM  Result Value Ref Range Status   MRSA, PCR NEGATIVE NEGATIVE Final   Staphylococcus aureus NEGATIVE NEGATIVE Final    Comment: (Lopez) The Xpert SA Assay (FDA approved for NASAL specimens in patients 57 years of age and older), is one component of a comprehensive surveillance program. It is not intended to diagnose infection nor to guide or monitor treatment.     Coagulation Studies:  Recent Labs  08/20/17 0340  LABPROT 13.1  INR 1.00    Urinalysis: No results for input(s): COLORURINE, LABSPEC, PHURINE, GLUCOSEU, HGBUR, BILIRUBINUR, KETONESUR, PROTEINUR, UROBILINOGEN, NITRITE, LEUKOCYTESUR in the last 72 hours.  Invalid input(s): APPERANCEUR    Imaging: No results found.   Medications:   . sodium chloride    . sodium chloride    . sodium chloride    . sodium chloride 10 mL/hr at 08/20/17 0957  . ferric gluconate (FERRLECIT/NULECIT) IV 62.5 mg (08/19/17 1251)   . amLODipine  10 mg Oral Q breakfast  . atorvastatin  80 mg Oral Q breakfast  . calcitRIOL  1 mcg Oral Q M,Lopez,F-HD  . calcium acetate  1,334 mg Oral TID WC  .  carvedilol  6.25 mg Oral BID  . darbepoetin (ARANESP) injection - DIALYSIS  200 mcg Intravenous Q Mon-HD  . feeding supplement (NEPRO CARB STEADY)  237 mL Oral Q24H  . heparin  5,000 Units Subcutaneous Q8H  . levothyroxine  75 mcg Oral QAC breakfast  . loratadine  10 mg Oral Q breakfast  . pantoprazole  40 mg Oral Daily  . 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 (R AVF 08/06/17)  Assessment/Recommendations  1. 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 Vascular access AVG 10/9  3. 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  Calcitriol 62mcg TTS Will increase binder as phos increased Calcium acetate 667 mg 2 q AC will continue to monitor 5. HTN  Appears relatively controlled will need to monitor 6. Hypothyroidism continue levothyroxine     LOS: 5 Latoya Lopez @TODAY @10 :46 AM

## 2017-08-21 NOTE — Discharge Summary (Addendum)
Physician Discharge Summary  Latoya Lopez NKN:[id number] DOB: 01/21/1941 DOA: 08/16/2017  PCP: [place] date: 08/16/2017 Discharge date: 08/21/2017  Admitted From:home Disposition:home  Recommendations for Outpatient Follow-up:  1. Follow up with PCP in 1-2 weeks 2. Continue dialysis as explained.   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 Patient was complaining of dyspnea. Chest x-ray with pleural effusion. Patient was transferred to [facility] for dialysis.  ESRD: Patient is receiving dialysis via PermCath. Patient has AV graft placed in October 9. Evaluated by nephrology. Outpatient dialysis was arranged on discharge. Tolerated dialysis well.  Anemia of chronic kidney disease : Continue Aranesp and iron during dialysis. Monitor CBC. Received a unit of blood transfusion in the hospital.  Hypertension: Continue antihypertensive medication and ultrafiltration during dialysis. Monitor blood pressure closely.  Pleural effusion, possible acute congestive heart failure type unspecified: Since patient is on dialysis now, discontinue Lasix. She was educated on low salt diet and fluid management. Clinically improved.  Continue home medication for hypothyroidism, dyslipidemia.  Discharge Diagnoses:  Principal Problem:   ESRD (end stage renal disease) ([place]) Active Problems:   Acute congestive heart failure (HCC)   Pleural effusion   HTN (hypertension), benign   Hypothyroidism    Discharge Instructions  Discharge Instructions    Call MD for:  difficulty breathing, headache or visual disturbances    Complete by:  As directed    Call MD for:  extreme fatigue    Complete by:  As directed     Call MD for:  hives    Complete by:  As directed    Call MD for:  persistant dizziness or light-headedness    Complete by:  As directed    Call MD for:  persistant nausea and vomiting    Complete by:  As directed    Call MD for:  severe uncontrolled pain    Complete by:  As directed    Call MD for:  temperature >100.4    Complete by:  As directed    Diet - low sodium heart healthy    Complete by:  As directed    Increase activity slowly    Complete by:  As directed      Allergies as of 08/21/2017   No Known Allergies     Medication List    STOP taking these medications   furosemide 80 MG tablet Commonly known as:  LASIX   IRON PO   sodium bicarbonate 650 MG tablet     TAKE these medications   amLODipine 10 MG tablet Commonly known as:  NORVASC Take 10 mg by mouth daily with breakfast.   atorvastatin 80 MG tablet Commonly known as:  LIPITOR Take 80 mg by mouth daily with breakfast.   calcitRIOL 0.25 MCG capsule Commonly known as:  ROCALTROL Take 2 capsules (0.5 mcg total) by mouth every other day. What changed:  when to take this   calcium acetate 667 MG capsule Commonly known as:  PHOSLO Take 2 capsules (1,334 mg total) by mouth 3 (three) times daily with meals.   carvedilol 6.25 MG tablet Commonly known as:  COREG Take 6.25 mg by mouth 2 (two) times daily.   CLARITIN 10 MG tablet Generic drug:  loratadine Take 10 mg by mouth daily with breakfast.   Darbepoetin Alfa 200  MCG/0.4ML Sosy injection Commonly known as:  ARANESP Inject 200 mcg into the skin every 14 (fourteen) days.   levothyroxine 75 MCG tablet Commonly known as:  SYNTHROID, LEVOTHROID Take 75 mcg by mouth daily before breakfast.   omeprazole 20 MG capsule Commonly known as:  PRILOSEC Take 20 mg by mouth daily before breakfast.   raloxifene 60 MG tablet Commonly known as:  EVISTA Take 60 mg by mouth daily with breakfast.      [facility] Follow  up.   Why:  Tuesday, Thursday, Saturday, chair time 11:50p Can start Thursday 08/22/17. Be there at 11am in 1st day to sign paperwork. Contact information: [place] [address] [phone]        [facility], [facility] Schedule an appointment as soon as possible for a visit in 1 week(s).   Contact information: [place] [address] [phone]          No Known Allergies  Consultations: Nephrology Vascular surgery  Procedures/Studies: PermCath and AV graft placement  Subjective: Seen and examined at bedside. Doing well. Denied headache, dizziness, nausea vomiting chest pain or shortness of breath.  Discharge Exam: Vitals:   08/21/17 0519 08/21/17 1000  BP: (!) 150/74 (!) 182/80  Pulse: (!) 101 99  Resp: 15 18  Temp: 97.9 F (36.6 C) 98.8 F (37.1 C)  SpO2: 95% 98%   Vitals:   08/20/17 1807 08/20/17 2056 08/21/17 0519 08/21/17 1000  BP: (!) 174/69 (!) 156/80 (!) 150/74 (!) 182/80  Pulse: 98 (!) 120 (!) 101 99  Resp: 20 18 15 18   Temp: 98.7 F (37.1 C) 98.7 F (37.1 C) 97.9 F (36.6 C) 98.8 F (37.1 C)  TempSrc: Oral   Oral  SpO2: 93% 94% 95% 98%  Weight:   64 kg (141 lb 1.5 oz)   Height:        General: Pt is alert, awake, not in acute distress Cardiovascular: RRR, S1/S2 +, no rubs, no gallops Respiratory: CTA bilaterally, no wheezing, no rhonchi Abdominal: Soft, NT, ND, bowel sounds + Extremities: no edema, no cyanosis Catheter site clean with no sign of bleeding.   The results of significant diagnostics from this hospitalization (including imaging, microbiology, ancillary and laboratory) are listed below for reference.     Microbiology: Recent Results (from the past 240 hour(s))  Surgical pcr screen     Status: None   Collection Time: 08/17/17  8:13 AM  Result Value Ref Range Status   MRSA, PCR NEGATIVE NEGATIVE Final   Staphylococcus aureus NEGATIVE NEGATIVE Final    Comment:  (NOTE) The Xpert SA Assay (FDA approved for NASAL specimens in patients 19 years of age and older), is one component of a comprehensive surveillance program. It is not intended to diagnose infection nor to guide or monitor treatment.      Labs: BNP (last 3 results) No results for input(s): BNP in the last 8760 hours. Basic Metabolic Panel:  Recent Labs Lab 08/16/17 2045 08/17/17 0330 08/18/17 0239 08/19/17 1200 08/20/17 0340  NA  --  140 139 135 137  K  --  3.4* 3.4* 3.1* 3.7  CL  --  108 103 101 101  CO2  --  16* 24 25 26   GLUCOSE  --  112* 103* 117* 111*  BUN  --  81* 33* 32* 21*  CREATININE 9.91* 9.72* 5.51* 5.95* 4.48*  CALCIUM  --  8.4* 8.3* 8.4*  8.7*  MG  --  1.6*  --   --   --   PHOS  --   --  6.9* 6.0*  --    Liver Function Tests:  Recent Labs Lab 08/17/17 0330 08/18/17 0239 08/19/17 1200  AST 24  --   --   ALT 25  --   --   ALKPHOS 51  --   --   BILITOT 0.6  --   --   PROT 5.9*  --   --   ALBUMIN 2.7* 2.4* 2.5*   No results for input(s): LIPASE, AMYLASE in the last 168 hours. No results for input(s): AMMONIA in the last 168 hours. CBC:  Recent Labs Lab 08/16/17 2045 08/17/17 1306 08/19/17 1200 08/20/17 0340 08/21/17 0210  WBC 7.4 6.8 5.2 7.6 6.3  HGB 7.7* 7.8* 7.0* 6.9* 7.8*  HCT 25.2* 25.4* 22.5* 22.7* 25.4*  MCV 94.0 94.4 93.4 96.2 94.8  PLT 260 248 214 202 199   Cardiac Enzymes: No results for input(s): CKTOTAL, CKMB, CKMBINDEX, TROPONINI in the last 168 hours. BNP: Invalid input(s): POCBNP CBG: No results for input(s): GLUCAP in the last 168 hours. D-Dimer No results for input(s): DDIMER in the last 72 hours. Hgb A1c No results for input(s): HGBA1C in the last 72 hours. Lipid Profile No results for input(s): CHOL, HDL, LDLCALC, TRIG, CHOLHDL, LDLDIRECT in the last 72 hours. Thyroid function studies No results for input(s): TSH, T4TOTAL, T3FREE, THYROIDAB in the last 72 hours.  Invalid input(s): FREET3 Anemia work up No  results for input(s): VITAMINB12, FOLATE, FERRITIN, TIBC, IRON, RETICCTPCT in the last 72 hours. Urinalysis No results found for: COLORURINE, APPEARANCEUR, [place], [place], [place], [place], [place], [place], PROTEINUR, UROBILINOGEN, NITRITE, LEUKOCYTESUR Sepsis Labs Invalid input(s): PROCALCITONIN,  WBC,  LACTICIDVEN Microbiology Recent Results (from the past 240 hour(s))  Surgical pcr screen     Status: None   Collection Time: 08/17/17  8:13 AM  Result Value Ref Range Status   MRSA, PCR NEGATIVE NEGATIVE Final   Staphylococcus aureus NEGATIVE NEGATIVE Final    Comment: (NOTE) The Xpert SA Assay (FDA approved for NASAL specimens in patients 101 years of age and older), is one component of a comprehensive surveillance program. It is not intended to diagnose infection nor to guide or monitor treatment.      Time coordinating discharge: 30 minutes  SIGNED:   Rosita Fire, MD  [facility] 08/21/2017, 11:04 AM  If 7PM-7AM, please contact night-coverage [email or web address] Password [email or web address]

## 2017-08-21 NOTE — Discharge Instructions (Signed)
° °  [facility]  Discharge Instructions  AV Fistula or Graft Surgery for Dialysis Access  Please refer to the following instructions for your post-procedure care. Your surgeon or physician assistant will discuss any changes with you.  Activity  You may drive the day following your surgery, if you are comfortable and no longer taking prescription pain medication. Resume full activity as the soreness in your incision resolves.  Bathing/Showering  You may shower after you go home. Keep your incision dry for 48 hours. Do not soak in a bathtub, hot tub, or swim until the incision heals completely. You may not shower if you have a hemodialysis catheter.  Incision Care  Clean your incision with mild soap and water after 48 hours. Pat the area dry with a clean towel. You do not need a bandage unless otherwise instructed. Do not apply any ointments or creams to your incision. You may have skin glue on your incision. Do not peel it off. It will come off on its own in about one week. Your arm may swell a bit after surgery. To reduce swelling use pillows to elevate your arm so it is above your heart. Your doctor will tell you if you need to lightly wrap your arm with an ACE bandage.  Diet  Resume your normal diet. There are not special food restrictions following this procedure. In order to heal from your surgery, it is CRITICAL to get adequate nutrition. Your body requires vitamins, minerals, and protein. Vegetables are the best source of vitamins and minerals. Vegetables also provide the perfect balance of protein. Processed food has little nutritional value, so try to avoid this.  Medications  Resume taking all of your medications. If your incision is causing pain, you may take over-the counter pain relievers such as acetaminophen (Tylenol). If you were prescribed a stronger pain medication, please be aware these medications can cause nausea and constipation. Prevent  nausea by taking the medication with a snack or meal. Avoid constipation by drinking plenty of fluids and eating foods with high amount of fiber, such as fruits, vegetables, and grains. Do not take Tylenol if you are taking prescription pain medications.  Follow up Your surgeon may want to see you in the office following your access surgery. If so, this will be arranged at the time of your surgery.  Please call us immediately for any of the following conditions:  Increased pain, redness, drainage (pus) from your incision site Fever of 101 degrees or higher Severe or worsening pain at your incision site Hand pain or numbness.  Reduce your risk of vascular disease:  Stop smoking. If you would like help, call [facility] at 1-800-QUIT-NOW 252-669-4491) or [facility] [place] your cholesterol Maintain a desired weight Control your diabetes Keep your blood pressure down  Dialysis  It will take several weeks to several months for your new dialysis access to be ready for use. Your surgeon will determine when it is OK to use it. Your nephrologist will continue to direct your dialysis. You can continue to use your Permcath until your new access is ready for use.   08/21/2017 Latoya Lopez [id number] Jan 09, 1941  Surgeon(s): Lopez, Latoya Oto, MD  Procedure(s): INSERTION OF ARTERIOVENOUS (AV) GORE-TEX STRETCH GRAFT INTO RIGHT ARM  x Do not stick graft for 4 weeks    If you have any questions, please call the office at [phone].

## 2017-08-21 NOTE — Progress Notes (Signed)
Accepted at [facility] 1st treatment Thurs,08/22/17 at 11:00am .Tentative schedule Tuesday,Thursday,Saturday .Chairtime 11:50am 2nd shift

## 2017-09-02 ENCOUNTER — Ambulatory Visit (HOSPITAL_COMMUNITY)
Admission: RE | Admit: 2017-09-02 | Discharge: 2017-09-02 | Disposition: A | Discharge status: Home / Self Care | Payer: Medicare Other | Source: Ambulatory Visit | Attending: Surgery | Admitting: Surgery

## 2017-09-02 DIAGNOSIS — N186 End stage renal disease: Secondary | ICD-10-CM

## 2017-09-02 DIAGNOSIS — Z992 Dependence on renal dialysis: Secondary | ICD-10-CM | POA: Diagnosis present

## 2017-09-02 DIAGNOSIS — Z48812 Encounter for surgical aftercare following surgery on the circulatory system: Secondary | ICD-10-CM | POA: Diagnosis present

## 2017-09-04 ENCOUNTER — Encounter: Payer: Self-pay | Admitting: Vascular Surgery

## 2017-09-04 ENCOUNTER — Ambulatory Visit (INDEPENDENT_AMBULATORY_CARE_PROVIDER_SITE_OTHER): Payer: Self-pay | Admitting: Vascular Surgery

## 2017-09-04 VITALS — BP 138/68 | HR 101 | Temp 99.3°F | Resp 14 | Ht 69.0 in | Wt 136.0 lb

## 2017-09-04 DIAGNOSIS — N186 End stage renal disease: Secondary | ICD-10-CM

## 2017-09-04 DIAGNOSIS — Z992 Dependence on renal dialysis: Secondary | ICD-10-CM

## 2017-09-04 NOTE — Progress Notes (Signed)
Subjective:     Patient ID: Ephriam Knuckles, female   DOB: [birth year], 76 y.o.   MRN: [id number]  HPI 76 year old female on dialysis currently via right IJ catheter had placement of right arm AV graft in April.  She has no new hand symptoms and incisions are healing well.  She has no complaints related to today's visit.   Review of Systems No complaints today    Objective:   Physical Exam [id number] Right IJ catheter site cdi with dressing in place Right upper arm av graft with strong thrill, palpable right radial pulse Incisions healing well    Assessment/plan     76 year old female status post right arm AV grafting has a palpable thrill in her hand is doing well with palpable radial pulse.  Per Dr. Oneida Alar previous notes should be okay to stick to graft in 2 weeks and after usable can remove the catheter.  She can follow-up as needed basis with Dr. Oneida Alar.    Tarin Johndrow C. Donzetta Matters, MD [facility] [place] Office: [phone] Pager: [phone]

## 2017-09-05 ENCOUNTER — Encounter: Payer: Medicare Other | Admitting: Vascular Surgery

## 2018-05-27 IMAGING — CR DG CHEST 2V
2 series · 2 of 2 positions shown · non-contrast
Comparison: Chest x-ray and CT chest from same date.

CLINICAL DATA: Shortness of breath.

EXAM:
CHEST  2 VIEW

[chest lat]
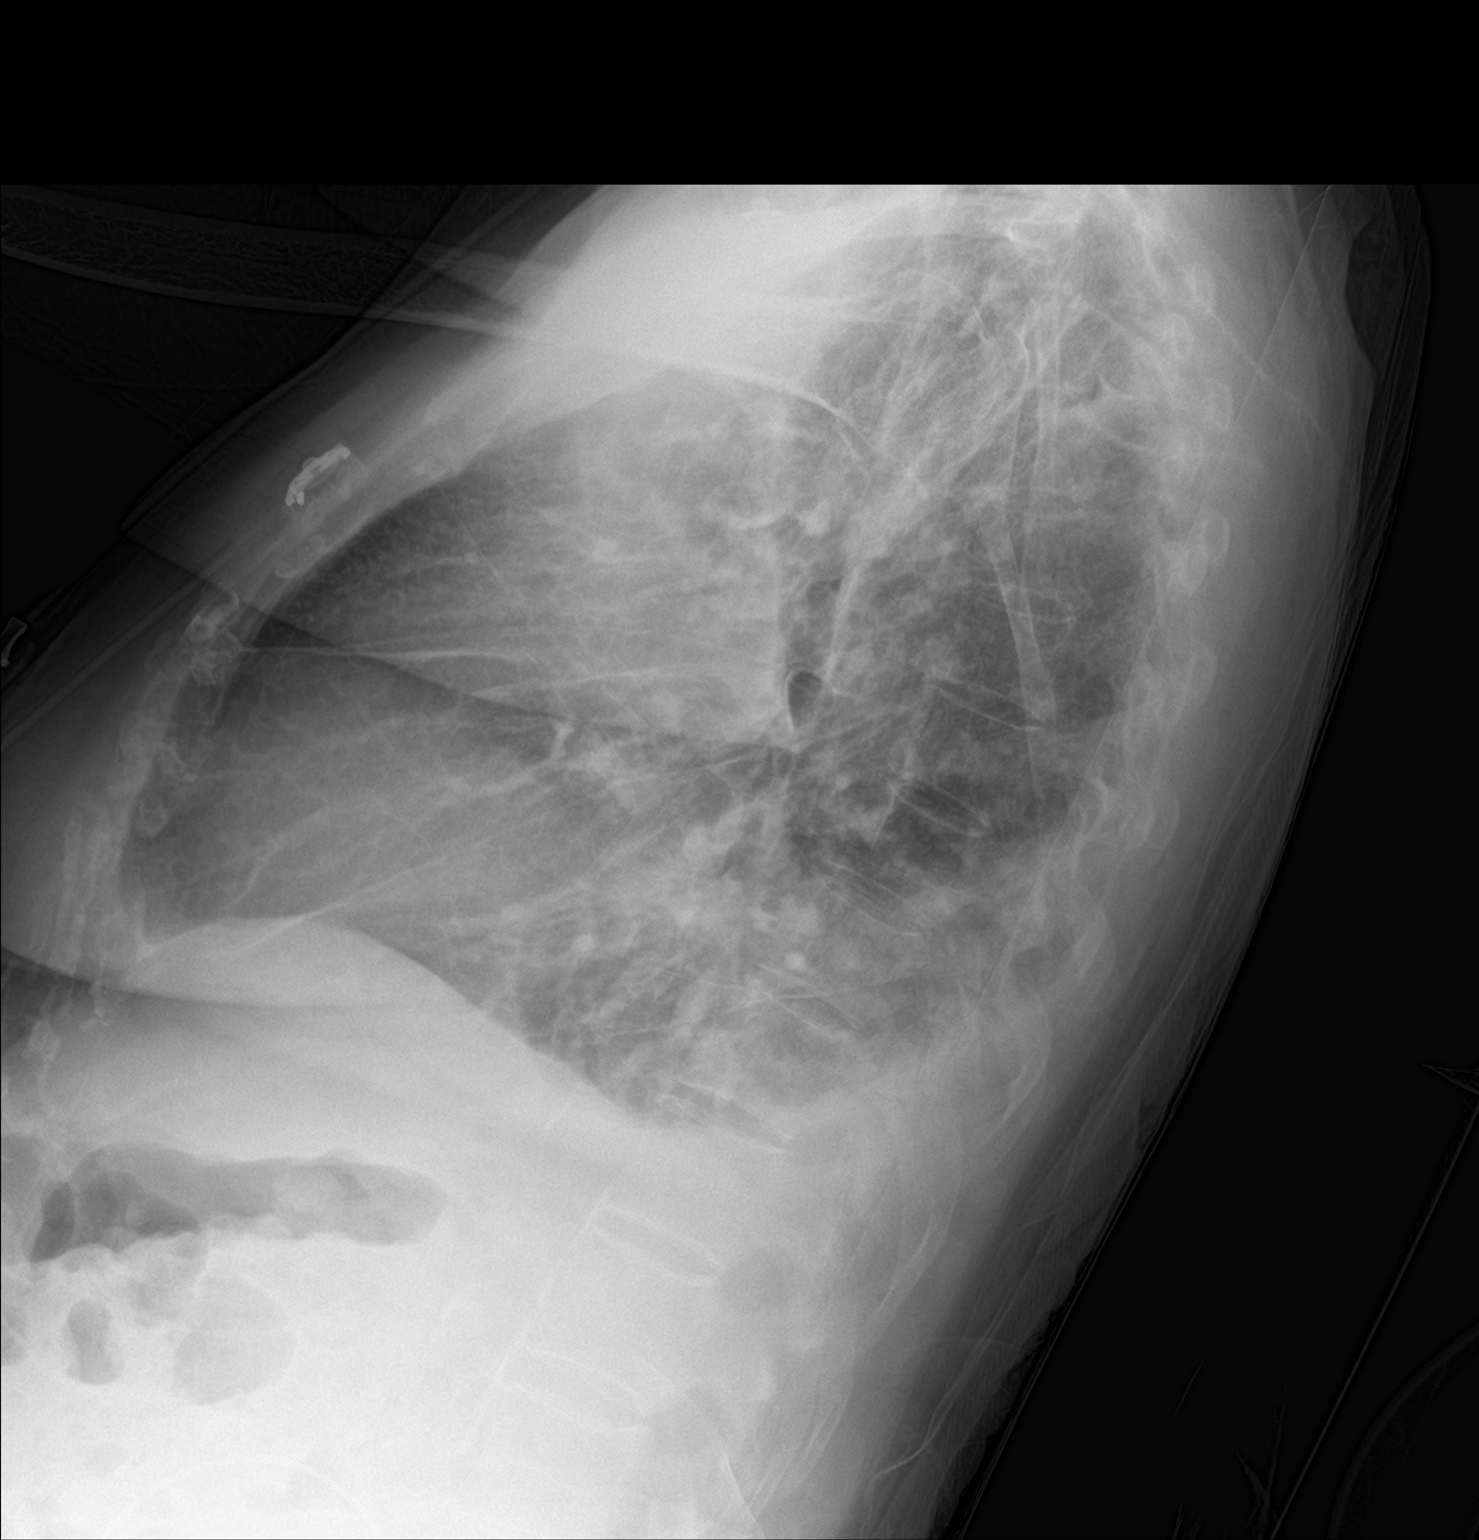

[chest ap]
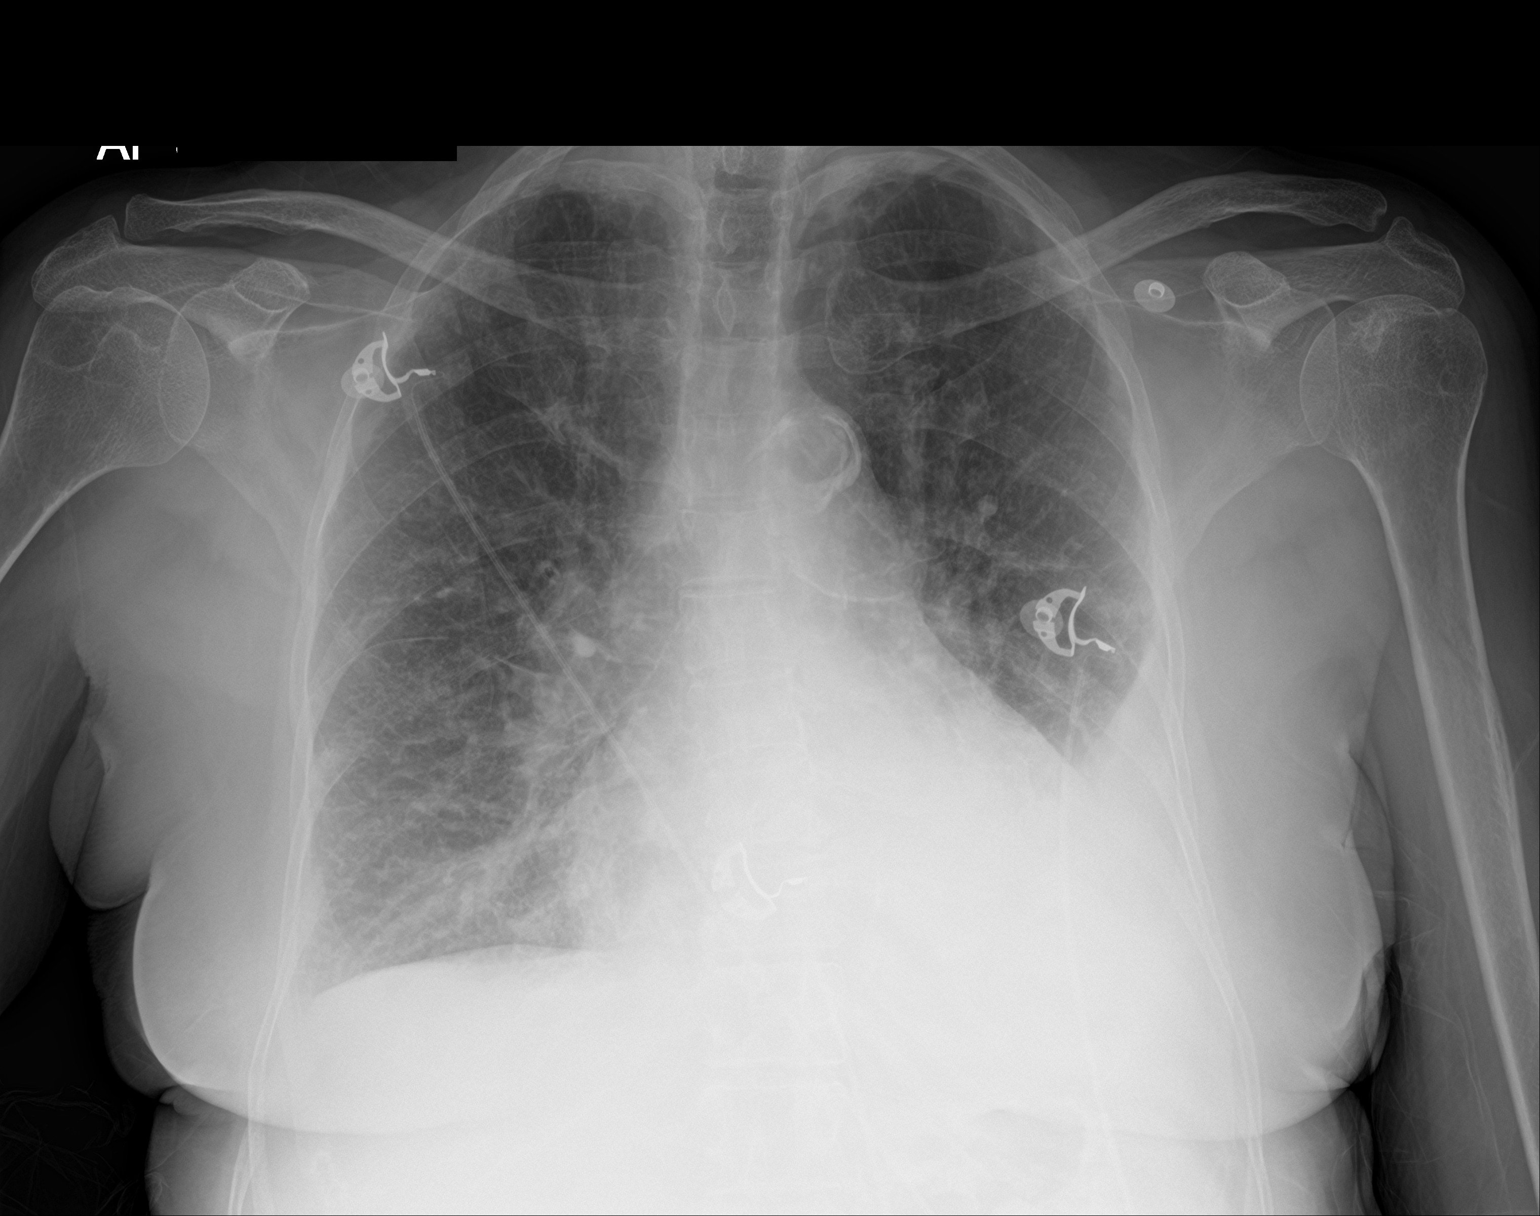

[2 of 2 positions shown; findings below may reference images not displayed]

FINDINGS: The cardiomediastinal silhouette is enlarged. Normal pulmonary
vascularity. Atherosclerotic calcification of the aortic arch.
Moderate left pleural effusion with adjacent left lower lobe
atelectasis. Small layering right pleural effusion. No pneumothorax.
No acute osseous abnormality.
IMPRESSION: Moderate left and small right pleural effusions with adjacent
basilar atelectasis, similar to prior study.

## 2018-07-07 ENCOUNTER — Other Ambulatory Visit: Payer: Self-pay

## 2018-07-07 ENCOUNTER — Encounter (HOSPITAL_COMMUNITY): Payer: Self-pay

## 2018-07-07 ENCOUNTER — Encounter (HOSPITAL_COMMUNITY)
Admission: EM | Disposition: A | Discharge status: Home / Self Care | Payer: Self-pay | Source: Home / Self Care | Attending: Emergency Medicine

## 2018-07-07 ENCOUNTER — Emergency Department (HOSPITAL_COMMUNITY): Payer: Medicare Other | Admitting: Anesthesiology

## 2018-07-07 ENCOUNTER — Ambulatory Visit (HOSPITAL_COMMUNITY)
Admission: EM | Admit: 2018-07-07 | Discharge: 2018-07-07 | Disposition: A | Discharge status: Home / Self Care | Payer: Medicare Other | Attending: Emergency Medicine | Admitting: Emergency Medicine

## 2018-07-07 DIAGNOSIS — Z79890 Hormone replacement therapy (postmenopausal): Secondary | ICD-10-CM | POA: Insufficient documentation

## 2018-07-07 DIAGNOSIS — I120 Hypertensive chronic kidney disease with stage 5 chronic kidney disease or end stage renal disease: Secondary | ICD-10-CM | POA: Insufficient documentation

## 2018-07-07 DIAGNOSIS — N186 End stage renal disease: Secondary | ICD-10-CM | POA: Insufficient documentation

## 2018-07-07 DIAGNOSIS — E785 Hyperlipidemia, unspecified: Secondary | ICD-10-CM | POA: Insufficient documentation

## 2018-07-07 DIAGNOSIS — Y832 Surgical operation with anastomosis, bypass or graft as the cause of abnormal reaction of the patient, or of later complication, without mention of misadventure at the time of the procedure: Secondary | ICD-10-CM | POA: Insufficient documentation

## 2018-07-07 DIAGNOSIS — Z87891 Personal history of nicotine dependence: Secondary | ICD-10-CM | POA: Diagnosis not present

## 2018-07-07 DIAGNOSIS — E039 Hypothyroidism, unspecified: Secondary | ICD-10-CM | POA: Insufficient documentation

## 2018-07-07 DIAGNOSIS — T82838A Hemorrhage of vascular prosthetic devices, implants and grafts, initial encounter: Secondary | ICD-10-CM

## 2018-07-07 DIAGNOSIS — Z992 Dependence on renal dialysis: Secondary | ICD-10-CM | POA: Diagnosis not present

## 2018-07-07 DIAGNOSIS — K219 Gastro-esophageal reflux disease without esophagitis: Secondary | ICD-10-CM | POA: Insufficient documentation

## 2018-07-07 DIAGNOSIS — Z85828 Personal history of other malignant neoplasm of skin: Secondary | ICD-10-CM | POA: Diagnosis not present

## 2018-07-07 DIAGNOSIS — T82898A Other specified complication of vascular prosthetic devices, implants and grafts, initial encounter: Secondary | ICD-10-CM | POA: Diagnosis not present

## 2018-07-07 HISTORY — PX: REVISION OF ARTERIOVENOUS GORETEX GRAFT: SHX6073

## 2018-07-07 LAB — BASIC METABOLIC PANEL
Anion gap: 15 (ref 5–15)
BUN: 67 mg/dL — ABNORMAL HIGH (ref 8–23)
CHLORIDE: 101 mmol/L (ref 98–111)
CO2: 25 mmol/L (ref 22–32)
Calcium: 9.3 mg/dL (ref 8.9–10.3)
Creatinine, Ser: 8.95 mg/dL — ABNORMAL HIGH (ref 0.44–1.00)
GFR calc Af Amer: 4 mL/min — ABNORMAL LOW (ref >60)
GFR calc non Af Amer: 4 mL/min — ABNORMAL LOW (ref >60)
GLUCOSE: 101 mg/dL — AB (ref 70–99)
POTASSIUM: 3.8 mmol/L (ref 3.5–5.1)
Sodium: 141 mmol/L (ref 135–145)

## 2018-07-07 LAB — CBC
HEMATOCRIT: 34.1 % — AB (ref 36.0–46.0)
HEMOGLOBIN: 10.1 g/dL — AB (ref 12.0–15.0)
MCH: 30.4 pg (ref 26.0–34.0)
MCHC: 29.6 g/dL — ABNORMAL LOW (ref 30.0–36.0)
MCV: 102.7 fL — AB (ref 78.0–100.0)
Platelets: 121 10*3/uL — ABNORMAL LOW (ref 150–400)
RBC: 3.32 MIL/uL — ABNORMAL LOW (ref 3.87–5.11)
RDW: 15.4 % (ref 11.5–15.5)
WBC: 6.9 10*3/uL (ref 4.0–10.5)

## 2018-07-07 SURGERY — REVISION OF ARTERIOVENOUS GORETEX GRAFT
Anesthesia: General | Site: Arm Upper | Laterality: Right

## 2018-07-07 MED ORDER — HEPARIN SODIUM (PORCINE) 1000 UNIT/ML IJ SOLN
INTRAMUSCULAR | Status: AC
  Filled 2018-07-07: qty 1

## 2018-07-07 MED ORDER — PHENYLEPHRINE 40 MCG/ML (10ML) SYRINGE FOR IV PUSH (FOR BLOOD PRESSURE SUPPORT)
PREFILLED_SYRINGE | INTRAVENOUS | Status: DC | PRN
  Administered 2018-07-07: 80 ug via INTRAVENOUS
  Administered 2018-07-07: 120 ug via INTRAVENOUS

## 2018-07-07 MED ORDER — SODIUM CHLORIDE 0.9 % IV SOLN
INTRAVENOUS | Status: DC | PRN
  Administered 2018-07-07: 70 ug/min via INTRAVENOUS

## 2018-07-07 MED ORDER — PROPOFOL 10 MG/ML IV BOLUS
INTRAVENOUS | Status: DC | PRN
  Administered 2018-07-07: 50 mg via INTRAVENOUS
  Administered 2018-07-07: 100 mg via INTRAVENOUS

## 2018-07-07 MED ORDER — CEPHALEXIN 500 MG PO CAPS: 500.0000 mg | ORAL_CAPSULE | Freq: Four times a day (QID) | ORAL | 0 refills | Status: AC

## 2018-07-07 MED ORDER — EPHEDRINE 5 MG/ML INJ
INTRAVENOUS | Status: AC
  Filled 2018-07-07: qty 10

## 2018-07-07 MED ORDER — SODIUM CHLORIDE 0.9 % IV SOLN
INTRAVENOUS | Status: DC
  Administered 2018-07-07 (×2): via INTRAVENOUS

## 2018-07-07 MED ORDER — PROPOFOL 10 MG/ML IV BOLUS
INTRAVENOUS | Status: AC
  Filled 2018-07-07: qty 20

## 2018-07-07 MED ORDER — FENTANYL CITRATE (PF) 100 MCG/2ML IJ SOLN
INTRAMUSCULAR | Status: DC | PRN
  Administered 2018-07-07 (×2): 50 ug via INTRAVENOUS

## 2018-07-07 MED ORDER — DEXAMETHASONE SODIUM PHOSPHATE 10 MG/ML IJ SOLN
INTRAMUSCULAR | Status: AC
  Filled 2018-07-07: qty 1

## 2018-07-07 MED ORDER — EPHEDRINE SULFATE-NACL 50-0.9 MG/10ML-% IV SOSY
PREFILLED_SYRINGE | INTRAVENOUS | Status: DC | PRN
  Administered 2018-07-07: 10 mg via INTRAVENOUS
  Administered 2018-07-07: 5 mg via INTRAVENOUS
  Administered 2018-07-07: 10 mg via INTRAVENOUS

## 2018-07-07 MED ORDER — 0.9 % SODIUM CHLORIDE (POUR BTL) OPTIME
TOPICAL | Status: DC | PRN
  Administered 2018-07-07: 1000 mL

## 2018-07-07 MED ORDER — SODIUM CHLORIDE 0.9 % IV SOLN
INTRAVENOUS | Status: AC
  Filled 2018-07-07: qty 1.2

## 2018-07-07 MED ORDER — LIDOCAINE 2% (20 MG/ML) 5 ML SYRINGE
INTRAMUSCULAR | Status: DC | PRN
  Administered 2018-07-07: 40 mg via INTRAVENOUS

## 2018-07-07 MED ORDER — CARVEDILOL 12.5 MG PO TABS
12.5000 mg | ORAL_TABLET | Freq: Once | ORAL | Status: AC
  Administered 2018-07-07: 12.5 mg via ORAL
  Filled 2018-07-07: qty 1

## 2018-07-07 MED ORDER — CEFAZOLIN SODIUM-DEXTROSE 2-4 GM/100ML-% IV SOLN
INTRAVENOUS | Status: AC
  Filled 2018-07-07: qty 100

## 2018-07-07 MED ORDER — ONDANSETRON HCL 4 MG/2ML IJ SOLN
INTRAMUSCULAR | Status: DC | PRN
  Administered 2018-07-07: 4 mg via INTRAVENOUS

## 2018-07-07 MED ORDER — DEXAMETHASONE SODIUM PHOSPHATE 10 MG/ML IJ SOLN
INTRAMUSCULAR | Status: DC | PRN
  Administered 2018-07-07: 10 mg via INTRAVENOUS

## 2018-07-07 MED ORDER — ONDANSETRON HCL 4 MG/2ML IJ SOLN
INTRAMUSCULAR | Status: AC
  Filled 2018-07-07: qty 2

## 2018-07-07 MED ORDER — CEFAZOLIN SODIUM-DEXTROSE 2-4 GM/100ML-% IV SOLN
2.0000 g | Freq: Once | INTRAVENOUS | Status: AC
  Administered 2018-07-07: 2 g via INTRAVENOUS

## 2018-07-07 MED ORDER — FENTANYL CITRATE (PF) 250 MCG/5ML IJ SOLN
INTRAMUSCULAR | Status: AC
  Filled 2018-07-07: qty 5

## 2018-07-07 MED ORDER — OXYCODONE-ACETAMINOPHEN 5-325 MG PO TABS: 1.0000 | ORAL_TABLET | ORAL | 0 refills | Status: DC | PRN

## 2018-07-07 MED ORDER — LIDOCAINE HCL (PF) 1 % IJ SOLN
INTRAMUSCULAR | Status: AC
  Filled 2018-07-07: qty 30

## 2018-07-07 MED ORDER — HEPARIN SODIUM (PORCINE) 1000 UNIT/ML IJ SOLN
INTRAMUSCULAR | Status: DC | PRN
  Administered 2018-07-07: 3000 [IU] via INTRAVENOUS

## 2018-07-07 MED ORDER — SODIUM CHLORIDE 0.9 % IV SOLN
INTRAVENOUS | Status: DC | PRN
  Administered 2018-07-07: 500 mL

## 2018-07-07 MED ORDER — CARVEDILOL 12.5 MG PO TABS
ORAL_TABLET | ORAL | Status: AC
  Administered 2018-07-07: 12.5 mg via ORAL
  Filled 2018-07-07: qty 1

## 2018-07-07 MED ORDER — LIDOCAINE 2% (20 MG/ML) 5 ML SYRINGE
INTRAMUSCULAR | Status: AC
  Filled 2018-07-07: qty 5

## 2018-07-07 SURGICAL SUPPLY — 40 items
ADH SKN CLS APL DERMABOND .7 (GAUZE/BANDAGES/DRESSINGS) ×2
BANDAGE ACE 4X5 VEL STRL LF (GAUZE/BANDAGES/DRESSINGS) ×2 IMPLANT
CANISTER SUCT 3000ML PPV (MISCELLANEOUS) ×3 IMPLANT
CANNULA VESSEL 3MM 2 BLNT TIP (CANNULA) ×3 IMPLANT
CLIP VESOCCLUDE MED 6/CT (CLIP) ×3 IMPLANT
CLIP VESOCCLUDE SM WIDE 6/CT (CLIP) ×3 IMPLANT
DECANTER SPIKE VIAL GLASS SM (MISCELLANEOUS) ×3 IMPLANT
DERMABOND ADVANCED (GAUZE/BANDAGES/DRESSINGS) ×1
DERMABOND ADVANCED .7 DNX12 (GAUZE/BANDAGES/DRESSINGS) ×2 IMPLANT
ELECT REM PT RETURN 9FT ADLT (ELECTROSURGICAL) ×3
ELECTRODE REM PT RTRN 9FT ADLT (ELECTROSURGICAL) ×2 IMPLANT
GAUZE SPONGE 4X4 12PLY STRL LF (GAUZE/BANDAGES/DRESSINGS) ×2 IMPLANT
GLOVE BIO SURGEON STRL SZ7.5 (GLOVE) ×3 IMPLANT
GLOVE BIOGEL PI IND STRL 6.5 (GLOVE) ×1 IMPLANT
GLOVE BIOGEL PI IND STRL 7.0 (GLOVE) ×1 IMPLANT
GLOVE BIOGEL PI IND STRL 7.5 (GLOVE) ×1 IMPLANT
GLOVE BIOGEL PI INDICATOR 6.5 (GLOVE) ×1
GLOVE BIOGEL PI INDICATOR 7.0 (GLOVE) ×1
GLOVE BIOGEL PI INDICATOR 7.5 (GLOVE) ×1
GLOVE ECLIPSE 7.0 STRL STRAW (GLOVE) ×2 IMPLANT
GOWN STRL REUS W/ TWL LRG LVL3 (GOWN DISPOSABLE) ×6 IMPLANT
GOWN STRL REUS W/TWL LRG LVL3 (GOWN DISPOSABLE) ×9
KIT BASIN OR (CUSTOM PROCEDURE TRAY) ×3 IMPLANT
KIT TURNOVER KIT B (KITS) ×3 IMPLANT
LOOP VESSEL MINI RED (MISCELLANEOUS) IMPLANT
NDL HYPO 25GX1X1/2 BEV (NEEDLE) ×1 IMPLANT
NEEDLE HYPO 25GX1X1/2 BEV (NEEDLE) ×3 IMPLANT
NS IRRIG 1000ML POUR BTL (IV SOLUTION) ×3 IMPLANT
PACK CV ACCESS (CUSTOM PROCEDURE TRAY) ×3 IMPLANT
PAD ARMBOARD 7.5X6 YLW CONV (MISCELLANEOUS) ×6 IMPLANT
SPONGE SURGIFOAM ABS GEL 100 (HEMOSTASIS) IMPLANT
SUT ETHILON 3 0 PS 1 (SUTURE) ×4 IMPLANT
SUT PROLENE 6 0 CC (SUTURE) ×3 IMPLANT
SUT VIC AB 3-0 SH 27 (SUTURE) ×3
SUT VIC AB 3-0 SH 27X BRD (SUTURE) ×2 IMPLANT
SUT VICRYL 4-0 PS2 18IN ABS (SUTURE) ×3 IMPLANT
TAPE CLOTH SURG 4X10 WHT LF (GAUZE/BANDAGES/DRESSINGS) ×2 IMPLANT
TOWEL GREEN STERILE (TOWEL DISPOSABLE) ×3 IMPLANT
UNDERPAD 30X30 (UNDERPADS AND DIAPERS) ×3 IMPLANT
WATER STERILE IRR 1000ML POUR (IV SOLUTION) ×3 IMPLANT

## 2018-07-07 NOTE — ED Notes (Signed)
Short stay advised we could d/c order for IV team and bring pt up for anesthesia to look at.  Pt removing all belongings and changing into a gown at this time

## 2018-07-07 NOTE — ED Provider Notes (Signed)
[place] EMERGENCY DEPARTMENT Provider Note   CSN: [id number] Arrival date & time: 07/07/18  1112     History   Chief Complaint Chief Complaint  Patient presents with  . Vascular Access Problem    HPI Latoya Lopez is a 77 y.o. female.  77 year old female with extensive past medical history including ESRD on HD Tuesday/Thursday/Saturday, membranous glomerulonephritis, hypertension presents with bleeding from graft.  Patient noticed a "blood blister" on her right upper arm graft yesterday, no history of trauma.  This morning around 6 AM she was awakened to bleeding from the site.  She went to [facility] and then was sent to a clinic where a stitch was placed.  She was referred here to the ER to be evaluated by Dr. fields.  Bleeding has stopped after stitch placed.  She has had some mild pain in her right axilla since yesterday.  She was supposed to have dialysis today because she had a scheduled missed session on Saturday due to a family wedding.  She denies any shortness of breath, dyspnea on exertion, chest pain, or other complaints.  The history is provided by the patient.    Past Medical History:  Diagnosis Date  . Anemia of renal disease   . Cancer ([place])    skin  . Chronic kidney disease    Stage IV  . GERD (gastroesophageal reflux disease)   . Hyperkalemia   . Hyperlipidemia   . Hyperphosphatemia   . Hypertension    Essential with goal blood pressure less than 130/80  . Hypokalemia   . Hypoparathyroidism ([place])   . Hypothyroidism   . Membranous glomerulonephritis   . Proteinuria   . S/P partial hysterectomy   . Seasonal allergies   . Secondary hyperparathyroidism, renal [facility])     Patient Active Problem List   Diagnosis Date Noted  . ESRD (end stage renal disease) ([place]) 08/17/2017  . HTN (hypertension), benign 08/17/2017  . Hypothyroidism 08/17/2017  . Acute congestive heart failure ([place])   . Pleural effusion     Past Surgical  History:  Procedure Laterality Date  . ABDOMINAL HYSTERECTOMY     PARTIAL  . AV FISTULA PLACEMENT Right 08/06/2017   Procedure: ARTERIOVENOUS (AV) FISTULA CREATION RIGHT ARM;  Surgeon: Elam Dutch, MD;  Location: [facility] OR;  Service: Vascular;  Laterality: Right;  . AV FISTULA PLACEMENT Right 08/20/2017   Procedure: INSERTION OF ARTERIOVENOUS (AV) GORE-TEX STRETCH GRAFT INTO RIGHT ARM;  Surgeon: Elam Dutch, MD;  Location: [place];  Service: Vascular;  Laterality: Right;  . INSERTION OF DIALYSIS CATHETER N/A 08/17/2017   Procedure: INSERTION OF DIALYSIS CATHETER;  Surgeon: Rosetta Posner, MD;  Location: [place];  Service: Vascular;  Laterality: N/A;  . SKIN BIOPSY      SKIN CANCER 6 AREAS  . TUBUALIGATION       OB History   None      Home Medications    Prior to Admission medications   Medication Sig Start Date End Date Taking? Authorizing Provider  amLODipine (NORVASC) 10 MG tablet Take 10 mg by mouth daily with breakfast.     [provider]  atorvastatin (LIPITOR) 80 MG tablet Take 80 mg by mouth daily with breakfast.     [provider]  calcitRIOL (ROCALTROL) 0.25 MCG capsule Take 2 capsules (0.5 mcg total) by mouth every other day. 08/21/17   Rosita Fire, MD  calcium acetate (PHOSLO) 667 MG capsule Take 2 capsules (1,334 mg  total) by mouth 3 (three) times daily with meals. 08/21/17   Rosita Fire, MD  calcium acetate (PHOSLO) 667 MG capsule Take 1,334 mg by mouth.    [provider]  carvedilol (COREG) 6.25 MG tablet Take 6.25 mg by mouth 2 (two) times daily.    [provider]  Darbepoetin Alfa (ARANESP) 200 MCG/0.4ML SOSY injection Inject 200 mcg into the skin every 14 (fourteen) days.     [provider]  levothyroxine (SYNTHROID, LEVOTHROID) 75 MCG tablet Take 75 mcg by mouth daily before breakfast.    [provider]  loratadine (CLARITIN) 10 MG tablet Take 10 mg by mouth daily with breakfast.      [provider]  omeprazole (PRILOSEC) 20 MG capsule Take 20 mg by mouth daily before breakfast.     [provider]  raloxifene (EVISTA) 60 MG tablet Take 60 mg by mouth daily with breakfast.     [provider]    Family History Family History  Problem Relation Age of Onset  . Stroke Mother   . Heart attack Father   . Heart attack Sister     Social History Social History   Tobacco Use  . Smoking status: Former Smoker    Types: Cigarettes  . Smokeless tobacco: Never Used  . Tobacco comment: quit smoking cigarettes in 1997  Substance Use Topics  . Alcohol use: No  . Drug use: No     Allergies   Patient has no known allergies.   Review of Systems Review of Systems All other systems reviewed and are negative except that which was mentioned in HPI   Physical Exam Updated Vital Signs BP 131/66   Pulse 91   Temp 98.4 F (36.9 C) (Oral)   Resp 18   SpO2 98%   Physical Exam  Constitutional: She is oriented to person, place, and time. She appears well-developed and well-nourished. No distress.  HENT:  Head: Normocephalic and atraumatic.  Moist mucous membranes  Eyes: Conjunctivae are normal.  Neck: Neck supple.  Cardiovascular: Normal rate and regular rhythm.  Murmur heard. Pulmonary/Chest: Effort normal and breath sounds normal.  Abdominal: Soft. Bowel sounds are normal. She exhibits no distension. There is no tenderness.  Musculoskeletal: She exhibits no tenderness.  Neurological: She is alert and oriented to person, place, and time.  Fluent speech  Skin: Skin is warm and dry.  RUE AV fistula with palpable thrill; suture in place over bleeding site with no active bleeding; no lymphadenopathy of axilla  Psychiatric: She has a normal mood and affect. Judgment normal.  Nursing note and vitals reviewed.    ED Treatments / Results  Labs (all labs ordered are listed, but only abnormal results are displayed) Labs Reviewed  BASIC  METABOLIC PANEL - Abnormal; Notable for the following components:      Result Value   Glucose, Bld 101 (*)    BUN 67 (*)    Creatinine, Ser 8.95 (*)    GFR calc non Af Amer 4 (*)    GFR calc Af Amer 4 (*)    All other components within normal limits  CBC    EKG None  Radiology No results found.  Procedures Procedures (including critical care time)  Medications Ordered in ED Medications - No data to display   Initial Impression / Assessment and Plan / ED Course  I have reviewed the triage vital signs and the nursing notes.  Pertinent labs that were available during my care of the  patient were reviewed by me and considered in my medical decision making (see chart for details).    She was well-appearing and comfortable on exam with no active hemorrhage.  Labs show potassium of 3.8 and no shortness of breath or hypoxia to mandate emergent dialysis.  I discussed with Dr. Oneida Alar, who evaluated the patient in the ED and took to the OR for management.  Final Clinical Impressions(s) / ED Diagnoses   Final diagnoses:  Bleeding from dialysis shunt, initial encounter [facility])    ED Discharge Orders    None       Naleah Kofoed, Wenda Overland, MD 07/07/18 1436

## 2018-07-07 NOTE — Anesthesia Postprocedure Evaluation (Signed)
Anesthesia Post Note  Patient: Latoya Lopez  Procedure(s) Performed: REVISION OF ARTERIOVENOUS GORETEX GRAFT (Right Arm Upper)     Patient location during evaluation: PACU Anesthesia Type: General Level of consciousness: awake and alert Pain management: pain level controlled Vital Signs Assessment: post-procedure vital signs reviewed and stable Respiratory status: spontaneous breathing, nonlabored ventilation, respiratory function stable and patient connected to nasal cannula oxygen Cardiovascular status: blood pressure returned to baseline and stable Postop Assessment: no apparent nausea or vomiting Anesthetic complications: no    Last Vitals:  Vitals:   07/07/18 1735 07/07/18 1745  BP:  129/68  Pulse:  90  Resp:  16  Temp: (!) 36.3 C   SpO2:  94%    Last Pain:  Vitals:   07/07/18 1745  TempSrc:   PainSc: 0-No pain                 Rayne Cowdrey COKER

## 2018-07-07 NOTE — Consult Note (Signed)
Referring Physician: [place]  Patient name: Latoya Lopez MRN: [id number] DOB: 12-03-1940 Sex: female  REASON FOR CONSULT: right arm bleeding graft  HPI: Latoya Lopez is a 77 y.o. female, with bleeding ulceration right upper arm AV graft earlier today.  Suture placed over hole by Dr Augustin Coupe which achieved hemostasis. She denies fever or chills but stated area around graft became red as the blister popped up.   Other medical problems include hypertension, hyperlipid both of which are stable. Usually T Th Sat but last HD was Friday.  Past Medical History:  Diagnosis Date  . Anemia of renal disease   . Cancer ([place])    skin  . Chronic kidney disease    Stage IV  . GERD (gastroesophageal reflux disease)   . Hyperkalemia   . Hyperlipidemia   . Hyperphosphatemia   . Hypertension    Essential with goal blood pressure less than 130/80  . Hypokalemia   . Hypoparathyroidism ([place])   . Hypothyroidism   . Membranous glomerulonephritis   . Proteinuria   . S/P partial hysterectomy   . Seasonal allergies   . Secondary hyperparathyroidism, renal [facility])    Past Surgical History:  Procedure Laterality Date  . ABDOMINAL HYSTERECTOMY     PARTIAL  . AV FISTULA PLACEMENT Right 08/06/2017   Procedure: ARTERIOVENOUS (AV) FISTULA CREATION RIGHT ARM;  Surgeon: Elam Dutch, MD;  Location: [facility] OR;  Service: Vascular;  Laterality: Right;  . AV FISTULA PLACEMENT Right 08/20/2017   Procedure: INSERTION OF ARTERIOVENOUS (AV) GORE-TEX STRETCH GRAFT INTO RIGHT ARM;  Surgeon: Elam Dutch, MD;  Location: [place];  Service: Vascular;  Laterality: Right;  . INSERTION OF DIALYSIS CATHETER N/A 08/17/2017   Procedure: INSERTION OF DIALYSIS CATHETER;  Surgeon: Rosetta Posner, MD;  Location: [place];  Service: Vascular;  Laterality: N/A;  . SKIN BIOPSY      SKIN CANCER 6 AREAS  . [place]      Family History  Problem Relation Age of Onset  . Stroke Mother   . Heart attack Father   . Heart attack  Sister     SOCIAL HISTORY: Social History   Socioeconomic History  . Marital status: Married    Spouse name: Not on file  . Number of children: Not on file  . Years of education: Not on file  . Highest education level: Not on file  Occupational History  . Not on file  Social Needs  . Financial resource strain: Not on file  . Food insecurity:    Worry: Not on file    Inability: Not on file  . Transportation needs:    Medical: Not on file    Non-medical: Not on file  Tobacco Use  . Smoking status: Former Smoker    Types: Cigarettes  . Smokeless tobacco: Never Used  . Tobacco comment: quit smoking cigarettes in 1997  Substance and Sexual Activity  . Alcohol use: No  . Drug use: No  . Sexual activity: Not on file  Lifestyle  . Physical activity:    Days per week: Not on file    Minutes per session: Not on file  . Stress: Not on file  Relationships  . Social connections:    Talks on phone: Not on file    Gets together: Not on file    Attends religious service: Not on file    Active member of club or organization: Not on file    Attends meetings of clubs  or organizations: Not on file    Relationship status: Not on file  . Intimate partner violence:    Fear of current or ex partner: Not on file    Emotionally abused: Not on file    Physically abused: Not on file    Forced sexual activity: Not on file  Other Topics Concern  . Not on file  Social History Narrative  . Not on file    No Known Allergies  No current facility-administered medications for this encounter.    Current Outpatient Medications  Medication Sig Dispense Refill  . amLODipine (NORVASC) 10 MG tablet Take 10 mg by mouth daily with breakfast.     . atorvastatin (LIPITOR) 80 MG tablet Take 80 mg by mouth daily with breakfast.     . calcitRIOL (ROCALTROL) 0.25 MCG capsule Take 2 capsules (0.5 mcg total) by mouth every other day.    . calcium acetate (PHOSLO) 667 MG capsule Take 2 capsules (1,334 mg  total) by mouth 3 (three) times daily with meals. 90 capsule 0  . calcium acetate (PHOSLO) 667 MG capsule Take 1,334 mg by mouth.    . carvedilol (COREG) 6.25 MG tablet Take 6.25 mg by mouth 2 (two) times daily.    . Darbepoetin Alfa (ARANESP) 200 MCG/0.4ML SOSY injection Inject 200 mcg into the skin every 14 (fourteen) days.     Marland Kitchen levothyroxine (SYNTHROID, LEVOTHROID) 75 MCG tablet Take 75 mcg by mouth daily before breakfast.    . loratadine (CLARITIN) 10 MG tablet Take 10 mg by mouth daily with breakfast.     . omeprazole (PRILOSEC) 20 MG capsule Take 20 mg by mouth daily before breakfast.     . raloxifene (EVISTA) 60 MG tablet Take 60 mg by mouth daily with breakfast.       ROS:   General:  No weight loss, Fever, chills  HEENT: No recent headaches, no nasal bleeding, no visual changes, no sore throat  Neurologic: No dizziness, blackouts, seizures. No recent symptoms of stroke or mini- stroke. No recent episodes of slurred speech, or temporary blindness.  Cardiac: No recent episodes of chest pain/pressure, no shortness of breath at rest.  No shortness of breath with exertion.  Denies history of atrial fibrillation or irregular heartbeat  Vascular: No history of rest pain in feet.  No history of claudication.  No history of non-healing ulcer, No history of DVT   Pulmonary: No home oxygen, no productive cough, no hemoptysis,  No asthma or wheezing  Musculoskeletal:  [ ]  Arthritis, [ ]  Low back pain,  [ ]  Joint pain  Hematologic:No history of hypercoagulable state.  No history of easy bleeding.  No history of anemia  Gastrointestinal: No hematochezia or melena,  No gastroesophageal reflux, no trouble swallowing  Urinary: [X]  chronic Kidney disease, [X]  on HD - [ ]  MWF or [X]  TTHS, [ ]  Burning with urination, [ ]  Frequent urination, [ ]  Difficulty urinating;   Skin: No rashes  Psychological: No history of anxiety,  No history of depression   Physical Examination  Vitals:    07/07/18 1126 07/07/18 1216 07/07/18 1225  BP: (!) 135/59 (!) 128/57   Pulse: 93  88  Resp: 18    Temp: 98.4 F (36.9 C)    TempSrc: Oral    SpO2: 99%  99%    There is no height or weight on file to calculate BMI.  General:  Alert and oriented, no acute distress HEENT: Normal Neck: No JVD Pulmonary: Clear to auscultation  bilaterally Cardiac: Regular Rate and Rhythm  Abdomen: Soft, non-tender, non-distended, no mass Skin: No rash, 5 mm ulceration no hematoma mid right upper arm graft with surrounding erythema Extremity Pulses:  2+ radial, brachial  pulses right arm Musculoskeletal: No deformity or edema  Neurologic: Upper and lower extremity motor 5/5 and symmetric   ASSESSMENT:  Bleeding ulcerated AV graft right upper arm with surrounding erythema   PLAN: Revision right arm AV graft possible removal and catheter placement later today  CBC/BMET  Consent  Npo   Ruta Hinds, MD [facility] [place] Office: [phone] Pager: [phone]

## 2018-07-07 NOTE — ED Triage Notes (Signed)
Pt states she woke up this morning with bleeding from her graft. She was transported to [place] and a stitch was placed. Pt was sent over to see Dr. Oneida Alar. Bleeding controlled at this time.

## 2018-07-07 NOTE — Discharge Instructions (Signed)
Keep dressing on clean and dry until your at the dialysis center.  They may remove the dressing to perform HD.  Replace with Dry dressing after HD.  They may stick above and below the new incision for HD.

## 2018-07-07 NOTE — ED Notes (Signed)
OR called to bring pt in 53

## 2018-07-07 NOTE — Anesthesia Procedure Notes (Signed)
Procedure Name: LMA Insertion Date/Time: 07/07/2018 4:05 PM Performed by: Gwyndolyn Saxon, CRNA Pre-anesthesia Checklist: Patient identified, Emergency Drugs available, Suction available and Patient being monitored Patient Re-evaluated:Patient Re-evaluated prior to induction Oxygen Delivery Method: Circle system utilized Preoxygenation: Pre-oxygenation with 100% oxygen Induction Type: IV induction Ventilation: Mask ventilation without difficulty LMA: LMA inserted LMA Size: 4.0 Number of attempts: 1 Placement Confirmation: positive ETCO2,  CO2 detector and breath sounds checked- equal and bilateral Tube secured with: Tape Dental Injury: Teeth and Oropharynx as per pre-operative assessment

## 2018-07-07 NOTE — Anesthesia Preprocedure Evaluation (Addendum)
Anesthesia Evaluation  Patient identified by MRN, date of birth, ID band Patient awake    Reviewed: Allergy & Precautions, NPO status , Patient's Chart, lab work & pertinent test results  Airway Mallampati: II  TM Distance: >3 FB Neck ROM: Full    Dental   Pulmonary former smoker,    breath sounds clear to auscultation       Cardiovascular hypertension,  Rhythm:Regular Rate:Normal     Neuro/Psych    GI/Hepatic   Endo/Other    Renal/GU      Musculoskeletal   Abdominal   Peds  Hematology   Anesthesia Other Findings   Reproductive/Obstetrics                            Anesthesia Physical Anesthesia Plan  ASA: III  Anesthesia Plan: General   Post-op Pain Management:    Induction:   PONV Risk Score and Plan:   Airway Management Planned:   Additional Equipment:   Intra-op Plan:   Post-operative Plan:   Informed Consent: I have reviewed the patients History and Physical, chart, labs and discussed the procedure including the risks, benefits and alternatives for the proposed anesthesia with the patient or authorized representative who has indicated his/her understanding and acceptance.     Plan Discussed with:   Anesthesia Plan Comments:         Anesthesia Quick Evaluation

## 2018-07-07 NOTE — Op Note (Signed)
Procedure: Revision right upper arm AV graft  Preoperative diagnosis: Ulceration right upper arm AV graft  Postoperative diagnosis: Same  Anesthesia: General  Assistant: Gerri Lins, PA-C  Operative findings: Well incorporated AV graft 3 mm hole repaired with a running 6-0 Prolene suture  Operative details: After obtaining informed consent, the patient was taken the operating.  Patient was placed in supine position operating table.  After induction of anesthesia and placement of a laryngeal mask, the patient's entire right upper extremity was prepped and draped in usual sterile fashion.  Elliptical incision was made incorporating an ulceration in the right mid upper arm.  The incision was carried on through this obtains tissues down the level of the graft.  The graft was dissected free circumferentially above and below the area of the ulceration.  Patient was given 5000 units of intravenous heparin.  The graft was clamped proximally and distally.  The ulcer was then completely excised.  There was about a 3 mm hole within the graft.  This was thoroughly irrigated with normal saline solution.  The hole was then repaired with a running 6-0 Prolene suture.  Clamps were released and there was good hemostasis.  The skin was then closed with interrupted 3-0 nylon simple sutures.  The patient tolerated procedure well and there were no complications.  The instrument sponge needle count was correct the end of the case.  The patient was taken the recovery room in stable condition.  There was a palpable thrill within the graft at the conclusion of the case.  Ruta Hinds, MD [facility] [place] Office: [phone] Pager: [phone]

## 2018-07-07 NOTE — Transfer of Care (Signed)
Immediate Anesthesia Transfer of Care Note  Patient: Latoya Lopez  Procedure(s) Performed: REVISION OF ARTERIOVENOUS GORETEX GRAFT (Right Arm Upper)  Patient Location: PACU  Anesthesia Type:General  Level of Consciousness: drowsy  Airway & Oxygen Therapy: Patient Spontanous Breathing and Patient connected to nasal cannula oxygen  Post-op Assessment: Report given to RN and Post -op Vital signs reviewed and stable  Post vital signs: Reviewed and stable  Last Vitals:  Vitals Value Taken Time  BP 127/63 07/07/2018  5:08 PM  Temp    Pulse 90 07/07/2018  5:08 PM  Resp 13 07/07/2018  5:08 PM  SpO2 100 % 07/07/2018  5:08 PM  Vitals shown include unvalidated device data.  Last Pain:  Vitals:   07/07/18 1403  TempSrc:   PainSc: 0-No pain         Complications: No apparent anesthesia complications

## 2018-07-08 ENCOUNTER — Encounter (HOSPITAL_COMMUNITY): Payer: Self-pay | Admitting: Vascular Surgery

## 2018-07-08 ENCOUNTER — Telehealth: Payer: Self-pay | Admitting: Vascular Surgery

## 2018-07-08 NOTE — Telephone Encounter (Signed)
sch appt lvm mld ltr 07/31/18 1pm suture removal p/o PA

## 2018-07-30 ENCOUNTER — Ambulatory Visit (INDEPENDENT_AMBULATORY_CARE_PROVIDER_SITE_OTHER): Payer: Self-pay | Admitting: Physician Assistant

## 2018-07-30 VITALS — BP 160/75 | HR 92 | Temp 98.5°F | Resp 18 | Ht 69.0 in | Wt 145.5 lb

## 2018-07-30 DIAGNOSIS — N186 End stage renal disease: Secondary | ICD-10-CM

## 2018-07-30 NOTE — Progress Notes (Signed)
    Postoperative Access Visit   History of Present Illness   Latoya Lopez is a 77 y.o. year old female who presents for postoperative follow-up for  revision of right upper arm AV graft by Dr. Oneida Alar on 07/07/2018 due to ulceration.  This is only a small section of the graft that was revised and the patient has been able to have hemodialysis on her Tuesday Thursday schedule without complication.  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  She denies any difficulty with healing of her incision.    Physical Examination   Vitals:   07/30/18 1305  BP: (!) 160/75  Pulse: 92  Resp: 18  Temp: 98.5 F (36.9 C)  TempSrc: Oral  SpO2: 95%  Weight: 145 lb 8 oz (66 kg)  Height: 5\' 9"  (1.753 m)   Body mass index is 21.49 kg/m.  right arm Incision is healed with some remaining eschar, hand grip is 5/5, sensation in digits is intact, palpable thrill, bruit can be auscultated, palpable right radial pulse    Medical Decision Making   Latoya Lopez is a 77 y.o. year old female who presents s/p  revision of right upper arm AV graft due to ulceration   Sutures were removed in the office today  Continue to avoid cannulating AV graft in the area of the incision for at least an additional 2 weeks  The patient may follow-up on an as-needed basis   Dagoberto Ligas PA-C [facility] [place] Office: [phone]

## 2018-08-15 ENCOUNTER — Ambulatory Visit (INDEPENDENT_AMBULATORY_CARE_PROVIDER_SITE_OTHER): Payer: Self-pay | Admitting: Physician Assistant

## 2018-08-15 ENCOUNTER — Other Ambulatory Visit: Payer: Self-pay

## 2018-08-15 VITALS — BP 133/67 | HR 87 | Temp 98.0°F | Resp 16 | Ht 69.0 in | Wt 142.0 lb

## 2018-08-15 DIAGNOSIS — N186 End stage renal disease: Secondary | ICD-10-CM

## 2018-08-15 NOTE — Progress Notes (Signed)
  POST OPERATIVE OFFICE NOTE    CC:  F/u for surgery  HPI:  This is a 77 y.o. female who is s/p Revision right upper arm AV graft secondary to  bleeding ulceration right upper arm AV graft.  Operative findings well incorporated AV graft 3 mm hole repaired with a running 6-0 Prolene suture.  She is here today for follow up incisional check.  She denise pain, loss of motion and numbness in the right UE.    No Known Allergies  Current Outpatient Medications  Medication Sig Dispense Refill  . acetaminophen (TYLENOL) 325 MG tablet Take 325 mg by mouth every 6 (six) hours as needed for mild pain.    Marland Kitchen amLODipine (NORVASC) 10 MG tablet Take 10 mg by mouth daily with breakfast.     . atorvastatin (LIPITOR) 80 MG tablet Take 80 mg by mouth daily with breakfast.     . calcium acetate (PHOSLO) 667 MG capsule Take 2 capsules (1,334 mg total) by mouth 3 (three) times daily with meals. (Patient taking differently: Take 2,001 mg by mouth 2 (two) times daily. ) 90 capsule 0  . carvedilol (COREG) 12.5 MG tablet Take 12.5 mg by mouth 2 (two) times daily. Do not take on dialysis days, Tus/Thurs/Sat    . levothyroxine (SYNTHROID, LEVOTHROID) 75 MCG tablet Take 75 mcg by mouth daily before breakfast.    . lidocaine-prilocaine (EMLA) cream Apply 1 application topically See admin instructions. Apply small amount to access site 1 to 2 hours prior to dialysis.  12  . loratadine (CLARITIN) 10 MG tablet Take 10 mg by mouth daily with breakfast.     . Multiple Vitamins-Minerals (ONE-A-DAY 50 PLUS PO) Take 1 tablet by mouth daily.    Marland Kitchen omeprazole (PRILOSEC) 20 MG capsule Take 20 mg by mouth daily before breakfast.     . oxyCODONE-acetaminophen (PERCOCET/ROXICET) 5-325 MG tablet Take 1 tablet by mouth every 4 (four) hours as needed. 10 tablet 0  . raloxifene (EVISTA) 60 MG tablet Take 60 mg by mouth daily with breakfast.      No current facility-administered medications for this visit.      ROS:  See HPI  Physical  Exam:  Vitals:   08/15/18 1354  BP: 133/67  Pulse: 87  Resp: 16  Temp: 98 F (36.7 C)  SpO2: 94%    Incision:  Well healed left UE mid graft  Extremities:  Sensation intact, grip 5/5 and palpable radial pulse intact, palpable thrill in graft.  Skin well healed. Heart: RRR Lungs :  Non labored breathing  Assessment/Plan:  This is a 77 y.o. female who is s/p: Revision right upper arm AV graft secondary to  bleeding ulceration right upper arm AV graft.   I advised her to not stick the grft in the repair area, stick above and below.  F/U PRN   Roxy Horseman , PA-C [facility] [phone]

## 2018-09-17 ENCOUNTER — Encounter (HOSPITAL_COMMUNITY): Payer: Self-pay | Admitting: Emergency Medicine

## 2018-09-17 ENCOUNTER — Ambulatory Visit (HOSPITAL_COMMUNITY)
Admission: EM | Admit: 2018-09-17 | Discharge: 2018-09-17 | Disposition: A | Discharge status: Home / Self Care | Payer: Medicare Other | Source: Home / Self Care | Attending: Emergency Medicine | Admitting: Emergency Medicine

## 2018-09-17 ENCOUNTER — Emergency Department (HOSPITAL_COMMUNITY): Payer: Medicare Other | Admitting: Registered Nurse

## 2018-09-17 ENCOUNTER — Encounter (HOSPITAL_COMMUNITY)
Admission: EM | Disposition: A | Discharge status: Home / Self Care | Payer: Self-pay | Source: Home / Self Care | Attending: Emergency Medicine

## 2018-09-17 ENCOUNTER — Other Ambulatory Visit: Payer: Self-pay

## 2018-09-17 DIAGNOSIS — K219 Gastro-esophageal reflux disease without esophagitis: Secondary | ICD-10-CM | POA: Insufficient documentation

## 2018-09-17 DIAGNOSIS — T829XXA Unspecified complication of cardiac and vascular prosthetic device, implant and graft, initial encounter: Secondary | ICD-10-CM

## 2018-09-17 DIAGNOSIS — N186 End stage renal disease: Secondary | ICD-10-CM | POA: Insufficient documentation

## 2018-09-17 DIAGNOSIS — E7800 Pure hypercholesterolemia, unspecified: Secondary | ICD-10-CM

## 2018-09-17 DIAGNOSIS — I132 Hypertensive heart and chronic kidney disease with heart failure and with stage 5 chronic kidney disease, or end stage renal disease: Secondary | ICD-10-CM

## 2018-09-17 DIAGNOSIS — T82838A Hemorrhage of vascular prosthetic devices, implants and grafts, initial encounter: Secondary | ICD-10-CM | POA: Diagnosis not present

## 2018-09-17 DIAGNOSIS — N2581 Secondary hyperparathyroidism of renal origin: Secondary | ICD-10-CM | POA: Insufficient documentation

## 2018-09-17 DIAGNOSIS — T82591A Other mechanical complication of surgically created arteriovenous shunt, initial encounter: Secondary | ICD-10-CM

## 2018-09-17 DIAGNOSIS — T82868A Thrombosis of vascular prosthetic devices, implants and grafts, initial encounter: Secondary | ICD-10-CM

## 2018-09-17 DIAGNOSIS — Z87891 Personal history of nicotine dependence: Secondary | ICD-10-CM

## 2018-09-17 DIAGNOSIS — E039 Hypothyroidism, unspecified: Secondary | ICD-10-CM | POA: Insufficient documentation

## 2018-09-17 DIAGNOSIS — I509 Heart failure, unspecified: Secondary | ICD-10-CM | POA: Insufficient documentation

## 2018-09-17 DIAGNOSIS — E785 Hyperlipidemia, unspecified: Secondary | ICD-10-CM | POA: Insufficient documentation

## 2018-09-17 DIAGNOSIS — T82590A Other mechanical complication of surgically created arteriovenous fistula, initial encounter: Secondary | ICD-10-CM | POA: Diagnosis not present

## 2018-09-17 HISTORY — PX: THROMBECTOMY AND REVISION OF ARTERIOVENTOUS (AV) GORETEX  GRAFT: SHX6120

## 2018-09-17 LAB — TYPE AND SCREEN
ABO/RH(D): O POS
ANTIBODY SCREEN: NEGATIVE

## 2018-09-17 LAB — CBC WITH DIFFERENTIAL/PLATELET
Abs Immature Granulocytes: 0.02 10*3/uL (ref 0.00–0.07)
BASOS ABS: 0 10*3/uL (ref 0.0–0.1)
Basophils Relative: 1 %
Eosinophils Absolute: 0.5 10*3/uL (ref 0.0–0.5)
Eosinophils Relative: 8 %
HEMATOCRIT: 36.1 % (ref 36.0–46.0)
HEMOGLOBIN: 10.8 g/dL — AB (ref 12.0–15.0)
IMMATURE GRANULOCYTES: 0 %
LYMPHS ABS: 1.5 10*3/uL (ref 0.7–4.0)
LYMPHS PCT: 26 %
MCH: 30.2 pg (ref 26.0–34.0)
MCHC: 29.9 g/dL — AB (ref 30.0–36.0)
MCV: 100.8 fL — ABNORMAL HIGH (ref 80.0–100.0)
Monocytes Absolute: 0.5 10*3/uL (ref 0.1–1.0)
Monocytes Relative: 9 %
NEUTROS PCT: 56 %
NRBC: 0 % (ref 0.0–0.2)
Neutro Abs: 3.4 10*3/uL (ref 1.7–7.7)
Platelets: 156 10*3/uL (ref 150–400)
RBC: 3.58 MIL/uL — AB (ref 3.87–5.11)
RDW: 17.4 % — AB (ref 11.5–15.5)
WBC: 5.9 10*3/uL (ref 4.0–10.5)

## 2018-09-17 LAB — POCT I-STAT 4, (NA,K, GLUC, HGB,HCT)
GLUCOSE: 86 mg/dL (ref 70–99)
HCT: 28 % — ABNORMAL LOW (ref 36.0–46.0)
Hemoglobin: 9.5 g/dL — ABNORMAL LOW (ref 12.0–15.0)
POTASSIUM: 4.4 mmol/L (ref 3.5–5.1)
Sodium: 142 mmol/L (ref 135–145)

## 2018-09-17 SURGERY — THROMBECTOMY AND REVISION OF ARTERIOVENTOUS (AV) GORETEX  GRAFT
Anesthesia: General | Site: Arm Upper | Laterality: Right

## 2018-09-17 MED ORDER — MIDAZOLAM HCL 2 MG/2ML IJ SOLN
INTRAMUSCULAR | Status: AC
  Filled 2018-09-17: qty 2

## 2018-09-17 MED ORDER — LIDOCAINE-EPINEPHRINE (PF) 1 %-1:200000 IJ SOLN
INTRAMUSCULAR | Status: AC
  Filled 2018-09-17: qty 30

## 2018-09-17 MED ORDER — OXYCODONE-ACETAMINOPHEN 5-325 MG PO TABS: 1.0000 | ORAL_TABLET | Freq: Four times a day (QID) | ORAL | 0 refills | Status: DC | PRN

## 2018-09-17 MED ORDER — LIDOCAINE 2% (20 MG/ML) 5 ML SYRINGE
INTRAMUSCULAR | Status: DC | PRN
  Administered 2018-09-17: 80 mg via INTRAVENOUS

## 2018-09-17 MED ORDER — DEXAMETHASONE SODIUM PHOSPHATE 10 MG/ML IJ SOLN
INTRAMUSCULAR | Status: DC | PRN
  Administered 2018-09-17: 5 mg via INTRAVENOUS

## 2018-09-17 MED ORDER — SODIUM CHLORIDE 0.9 % IV SOLN
INTRAVENOUS | Status: DC
  Administered 2018-09-17: 13:00:00 via INTRAVENOUS

## 2018-09-17 MED ORDER — HEPARIN SODIUM (PORCINE) 1000 UNIT/ML IJ SOLN
INTRAMUSCULAR | Status: AC
  Filled 2018-09-17: qty 1

## 2018-09-17 MED ORDER — FENTANYL CITRATE (PF) 100 MCG/2ML IJ SOLN
25.0000 ug | INTRAMUSCULAR | Status: DC | PRN
  Administered 2018-09-17: 25 ug via INTRAVENOUS

## 2018-09-17 MED ORDER — SODIUM CHLORIDE 0.9 % IV SOLN
INTRAVENOUS | Status: AC
  Filled 2018-09-17: qty 1.2

## 2018-09-17 MED ORDER — PROTAMINE SULFATE 10 MG/ML IV SOLN
INTRAVENOUS | Status: AC
  Filled 2018-09-17: qty 5

## 2018-09-17 MED ORDER — 0.9 % SODIUM CHLORIDE (POUR BTL) OPTIME
TOPICAL | Status: DC | PRN
  Administered 2018-09-17: 1000 mL

## 2018-09-17 MED ORDER — LIDOCAINE 2% (20 MG/ML) 5 ML SYRINGE
INTRAMUSCULAR | Status: AC
  Filled 2018-09-17: qty 5

## 2018-09-17 MED ORDER — HEPARIN SODIUM (PORCINE) 1000 UNIT/ML IJ SOLN
INTRAMUSCULAR | Status: DC | PRN
  Administered 2018-09-17: 4000 [IU] via INTRAVENOUS

## 2018-09-17 MED ORDER — OXYCODONE HCL 5 MG PO TABS: 5.0000 mg | ORAL_TABLET | Freq: Once | ORAL | Status: DC | PRN

## 2018-09-17 MED ORDER — ONDANSETRON HCL 4 MG/2ML IJ SOLN
INTRAMUSCULAR | Status: AC
  Filled 2018-09-17: qty 2

## 2018-09-17 MED ORDER — FENTANYL CITRATE (PF) 100 MCG/2ML IJ SOLN
INTRAMUSCULAR | Status: DC | PRN
  Administered 2018-09-17: 25 ug via INTRAVENOUS
  Administered 2018-09-17: 50 ug via INTRAVENOUS
  Administered 2018-09-17: 25 ug via INTRAVENOUS
  Administered 2018-09-17: 50 ug via INTRAVENOUS

## 2018-09-17 MED ORDER — DEXAMETHASONE SODIUM PHOSPHATE 10 MG/ML IJ SOLN
INTRAMUSCULAR | Status: AC
  Filled 2018-09-17: qty 1

## 2018-09-17 MED ORDER — CEFAZOLIN SODIUM 1 G IJ SOLR
INTRAMUSCULAR | Status: AC
  Filled 2018-09-17: qty 10

## 2018-09-17 MED ORDER — OXYCODONE HCL 5 MG/5ML PO SOLN: 5.0000 mg | Freq: Once | ORAL | Status: DC | PRN

## 2018-09-17 MED ORDER — ONDANSETRON HCL 4 MG/2ML IJ SOLN
INTRAMUSCULAR | Status: DC | PRN
  Administered 2018-09-17: 4 mg via INTRAVENOUS

## 2018-09-17 MED ORDER — ONDANSETRON HCL 4 MG/2ML IJ SOLN: 4.0000 mg | Freq: Four times a day (QID) | INTRAMUSCULAR | Status: DC | PRN

## 2018-09-17 MED ORDER — FENTANYL CITRATE (PF) 250 MCG/5ML IJ SOLN
INTRAMUSCULAR | Status: AC
  Filled 2018-09-17: qty 5

## 2018-09-17 MED ORDER — EPHEDRINE 5 MG/ML INJ
INTRAVENOUS | Status: AC
  Filled 2018-09-17: qty 10

## 2018-09-17 MED ORDER — PROPOFOL 10 MG/ML IV BOLUS
INTRAVENOUS | Status: DC | PRN
  Administered 2018-09-17: 120 mg via INTRAVENOUS

## 2018-09-17 MED ORDER — HEMOSTATIC AGENTS (NO CHARGE) OPTIME
TOPICAL | Status: DC | PRN
  Administered 2018-09-17: 1 via TOPICAL

## 2018-09-17 MED ORDER — LIDOCAINE HCL (PF) 1 % IJ SOLN
INTRAMUSCULAR | Status: DC | PRN
  Administered 2018-09-17: .0001 mL

## 2018-09-17 MED ORDER — CEFAZOLIN SODIUM-DEXTROSE 1-4 GM/50ML-% IV SOLN
1.0000 g | INTRAVENOUS | Status: AC
  Administered 2018-09-17: 1 g via INTRAVENOUS

## 2018-09-17 MED ORDER — SODIUM CHLORIDE 0.9 % IV SOLN
INTRAVENOUS | Status: DC | PRN
  Administered 2018-09-17: 500 mL

## 2018-09-17 MED ORDER — EPHEDRINE SULFATE-NACL 50-0.9 MG/10ML-% IV SOSY
PREFILLED_SYRINGE | INTRAVENOUS | Status: DC | PRN
  Administered 2018-09-17 (×2): 10 mg via INTRAVENOUS

## 2018-09-17 MED ORDER — FENTANYL CITRATE (PF) 100 MCG/2ML IJ SOLN
INTRAMUSCULAR | Status: AC
  Filled 2018-09-17: qty 2

## 2018-09-17 MED ORDER — PROPOFOL 10 MG/ML IV BOLUS
INTRAVENOUS | Status: AC
  Filled 2018-09-17: qty 20

## 2018-09-17 MED ORDER — PROTAMINE SULFATE 10 MG/ML IV SOLN
INTRAVENOUS | Status: DC | PRN
  Administered 2018-09-17: 50 mg via INTRAVENOUS

## 2018-09-17 MED ORDER — LIDOCAINE HCL (PF) 1 % IJ SOLN
INTRAMUSCULAR | Status: AC
  Filled 2018-09-17: qty 30

## 2018-09-17 SURGICAL SUPPLY — 38 items
ADH SKN CLS APL DERMABOND .7 (GAUZE/BANDAGES/DRESSINGS) ×2
ARMBAND PINK RESTRICT EXTREMIT (MISCELLANEOUS) ×6 IMPLANT
BANDAGE ELASTIC 4 VELCRO ST LF (GAUZE/BANDAGES/DRESSINGS) ×2 IMPLANT
BNDG GAUZE ELAST 4 BULKY (GAUZE/BANDAGES/DRESSINGS) ×2 IMPLANT
CANISTER SUCT 3000ML PPV (MISCELLANEOUS) ×3 IMPLANT
CATH EMB 4FR 40CM (CATHETERS) ×2 IMPLANT
CATH EMB 4FR 80CM (CATHETERS) ×3 IMPLANT
CLIP VESOCCLUDE MED 6/CT (CLIP) ×3 IMPLANT
CLIP VESOCCLUDE SM WIDE 6/CT (CLIP) ×3 IMPLANT
COVER WAND RF STERILE (DRAPES) ×3 IMPLANT
DERMABOND ADVANCED (GAUZE/BANDAGES/DRESSINGS) ×4
DERMABOND ADVANCED .7 DNX12 (GAUZE/BANDAGES/DRESSINGS) ×1 IMPLANT
ELECT REM PT RETURN 9FT ADLT (ELECTROSURGICAL) ×3
ELECTRODE REM PT RTRN 9FT ADLT (ELECTROSURGICAL) ×1 IMPLANT
GAUZE SPONGE 4X4 12PLY STRL (GAUZE/BANDAGES/DRESSINGS) ×2 IMPLANT
GLOVE BIOGEL PI IND STRL 7.5 (GLOVE) ×1 IMPLANT
GLOVE BIOGEL PI INDICATOR 7.5 (GLOVE) ×2
GLOVE SURG SS PI 7.5 STRL IVOR (GLOVE) ×3 IMPLANT
GOWN STRL REUS W/ TWL LRG LVL3 (GOWN DISPOSABLE) ×2 IMPLANT
GOWN STRL REUS W/ TWL XL LVL3 (GOWN DISPOSABLE) ×1 IMPLANT
GOWN STRL REUS W/TWL LRG LVL3 (GOWN DISPOSABLE) ×6
GOWN STRL REUS W/TWL XL LVL3 (GOWN DISPOSABLE) ×3
GRAFT GORETEX STRT 6X50 (Vascular Products) ×2 IMPLANT
HEMOSTAT SNOW SURGICEL 2X4 (HEMOSTASIS) ×2 IMPLANT
KIT BASIN OR (CUSTOM PROCEDURE TRAY) ×3 IMPLANT
KIT TURNOVER KIT B (KITS) ×3 IMPLANT
NS IRRIG 1000ML POUR BTL (IV SOLUTION) ×3 IMPLANT
PACK CV ACCESS (CUSTOM PROCEDURE TRAY) ×3 IMPLANT
PAD ARMBOARD 7.5X6 YLW CONV (MISCELLANEOUS) ×6 IMPLANT
SUT ETHILON 3 0 PS 1 (SUTURE) ×4 IMPLANT
SUT MNCRL AB 4-0 PS2 18 (SUTURE) ×2 IMPLANT
SUT PROLENE 6 0 BV (SUTURE) ×5 IMPLANT
SUT VIC AB 3-0 SH 27 (SUTURE) ×9
SUT VIC AB 3-0 SH 27X BRD (SUTURE) ×1 IMPLANT
SUT VICRYL 4-0 PS2 18IN ABS (SUTURE) IMPLANT
TOWEL GREEN STERILE (TOWEL DISPOSABLE) ×3 IMPLANT
UNDERPAD 30X30 (UNDERPADS AND DIAPERS) ×3 IMPLANT
WATER STERILE IRR 1000ML POUR (IV SOLUTION) ×3 IMPLANT

## 2018-09-17 NOTE — ED Notes (Addendum)
Pt here from dialysis center with right arm graft that opened up. Dr. Augustin Coupe called and stated to watch dressing for no more bright red blood. Currently dressing, clean, dry, intact. Pt alert, oriented x4, appropriate.

## 2018-09-17 NOTE — Progress Notes (Signed)
Per Dr. Royce Macadamia, MD is okay with SBP 160-170s.

## 2018-09-17 NOTE — Anesthesia Preprocedure Evaluation (Signed)
Anesthesia Evaluation  Patient identified by MRN, date of birth, ID band Patient awake    Reviewed: Allergy & Precautions, H&P , NPO status , Patient's Chart, lab work & pertinent test results  Airway Mallampati: II   Neck ROM: full    Dental   Pulmonary former smoker,    breath sounds clear to auscultation       Cardiovascular hypertension, +CHF   Rhythm:regular Rate:Normal     Neuro/Psych    GI/Hepatic GERD  ,  Endo/Other  Hypothyroidism   Renal/GU ESRF and DialysisRenal disease     Musculoskeletal   Abdominal   Peds  Hematology  (+) Blood dyscrasia, anemia ,   Anesthesia Other Findings   Reproductive/Obstetrics                             Anesthesia Physical Anesthesia Plan  ASA: III  Anesthesia Plan: General   Post-op Pain Management:    Induction: Intravenous  PONV Risk Score and Plan: 3 and Ondansetron and Treatment may vary due to age or medical condition  Airway Management Planned: LMA  Additional Equipment:   Intra-op Plan:   Post-operative Plan:   Informed Consent: I have reviewed the patients History and Physical, chart, labs and discussed the procedure including the risks, benefits and alternatives for the proposed anesthesia with the patient or authorized representative who has indicated his/her understanding and acceptance.     Plan Discussed with: CRNA, Anesthesiologist and Surgeon  Anesthesia Plan Comments:         Anesthesia Quick Evaluation

## 2018-09-17 NOTE — ED Triage Notes (Signed)
PT arrives via EMS from vascular access clinic where she was having a procedure to repair her clogged dialysis access. During the procedure a shunt became dislodged and PT started to bleed from access. Access was ballooned with two balloons, bleeding was stopped.  Access is in right arm.   Pt last dialyzed on Saturday.   PT was given 5000 units heparin, 1mg  versed, 25 mcg fentanyl for procedure.

## 2018-09-17 NOTE — Discharge Instructions (Signed)
° °  [facility] [place] ° °Discharge Instructions ° °AV Fistula or Graft Surgery for Dialysis Access ° °Please refer to the following instructions for your post-procedure care. Your surgeon or physician assistant will discuss any changes with you. ° °Activity ° °You may drive the day following your surgery, if you are comfortable and no longer taking prescription pain medication. Resume full activity as the soreness in your incision resolves. ° °Bathing/Showering ° °You may shower after you go home. Keep your incision dry for 48 hours. Do not soak in a bathtub, hot tub, or swim until the incision heals completely. You may not shower if you have a hemodialysis catheter. ° °Incision Care ° °Clean your incision with mild soap and water after 48 hours. Pat the area dry with a clean towel. You do not need a bandage unless otherwise instructed. Do not apply any ointments or creams to your incision. You may have skin glue on your incision. Do not peel it off. It will come off on its own in about one week. Your arm may swell a bit after surgery. To reduce swelling use pillows to elevate your arm so it is above your heart. Your doctor will tell you if you need to lightly wrap your arm with an ACE bandage. ° °Diet ° °Resume your normal diet. There are not special food restrictions following this procedure. In order to heal from your surgery, it is CRITICAL to get adequate nutrition. Your body requires vitamins, minerals, and protein. Vegetables are the best source of vitamins and minerals. Vegetables also provide the perfect balance of protein. Processed food has little nutritional value, so try to avoid this. ° °Medications ° °Resume taking all of your medications. If your incision is causing pain, you may take over-the counter pain relievers such as acetaminophen (Tylenol). If you were prescribed a stronger pain medication, please be aware these medications can cause nausea and constipation. Prevent  nausea by taking the medication with a snack or meal. Avoid constipation by drinking plenty of fluids and eating foods with high amount of fiber, such as fruits, vegetables, and grains. Do not take Tylenol if you are taking prescription pain medications. ° ° ° ° °Follow up °Your surgeon may want to see you in the office following your access surgery. If so, this will be arranged at the time of your surgery. ° °Please call us immediately for any of the following conditions: ° °Increased pain, redness, drainage (pus) from your incision site °Fever of 101 degrees or higher °Severe or worsening pain at your incision site °Hand pain or numbness. ° °Reduce your risk of vascular disease: ° °Stop smoking. If you would like help, call [facility] at 1-800-QUIT-NOW (1-800-784-8669) or [place] at 336-586-4000 ° °Manage your cholesterol °Maintain a desired weight °Control your diabetes °Keep your blood pressure down ° °Dialysis ° °It will take several weeks to several months for your new dialysis access to be ready for use. Your surgeon will determine when it is OK to use it. Your nephrologist will continue to direct your dialysis. You can continue to use your Permcath until your new access is ready for use. ° °If you have any questions, please call the office at [phone]. ° °

## 2018-09-17 NOTE — Anesthesia Procedure Notes (Addendum)
Procedure Name: LMA Insertion Date/Time: 09/17/2018 1:30 PM Performed by: Trinna Post., CRNA Pre-anesthesia Checklist: Patient identified, Emergency Drugs available, Suction available and Patient being monitored Patient Re-evaluated:Patient Re-evaluated prior to induction Oxygen Delivery Method: Circle system utilized Preoxygenation: Pre-oxygenation with 100% oxygen Induction Type: IV induction Ventilation: Mask ventilation without difficulty LMA: LMA inserted LMA Size: 4.0 Number of attempts: 1 Placement Confirmation: positive ETCO2 and breath sounds checked- equal and bilateral Tube secured with: Tape Dental Injury: Teeth and Oropharynx as per pre-operative assessment

## 2018-09-17 NOTE — ED Notes (Signed)
Dr. Donnetta Hutching paged

## 2018-09-17 NOTE — Anesthesia Postprocedure Evaluation (Signed)
Anesthesia Post Note  Patient: Latoya Lopez  Procedure(s) Performed: THROMBECTOMY AND REVISION OF ARTERIOVENTOUS (AV) GORETEX  GRAFT (Right Arm Upper)     Patient location during evaluation: PACU Anesthesia Type: General Level of consciousness: awake and alert Pain management: pain level controlled Vital Signs Assessment: post-procedure vital signs reviewed and stable Respiratory status: spontaneous breathing, nonlabored ventilation, respiratory function stable and patient connected to nasal cannula oxygen Cardiovascular status: blood pressure returned to baseline and stable Postop Assessment: no apparent nausea or vomiting Anesthetic complications: no    Last Vitals:  Vitals:   09/17/18 1624 09/17/18 1640  BP: (!) 154/84 (!) 158/88  Pulse: 99 98  Resp: 15 16  Temp: (!) 36.3 C   SpO2: 97% 93%    Last Pain:  Vitals:   09/17/18 1640  TempSrc:   PainSc: 0-No pain                 Sheridan Gettel

## 2018-09-17 NOTE — ED Provider Notes (Addendum)
[facility] Provider Note   CSN: [id number] Arrival date & time: 09/17/18  1029     History   Chief Complaint Chief Complaint  Patient presents with  . Vascular Access Problem    HPI Latoya Lopez is a 77 y.o. female.  HPI   77 year old female with history below presents from the vascular access clinic for concern of bleeding from her vascular access.  She was having a procedure at the [facility] vascular access clinic, and during the procedure the shunt became dislodged and she began to bleed.  The axis was ballooned and bleeding stopped.  She has not had any bleeding since then.  Denies any other symptoms, including no shortness of breath, chest pain or fevers.  Past Medical History:  Diagnosis Date  . Anemia of renal disease   . Cancer ([place])    skin  . Chronic kidney disease    Stage IV  . GERD (gastroesophageal reflux disease)   . Hyperkalemia   . Hyperlipidemia   . Hyperphosphatemia   . Hypertension    Essential with goal blood pressure less than 130/80  . Hypokalemia   . Hypoparathyroidism ([place])   . Hypothyroidism   . Membranous glomerulonephritis   . Proteinuria   . S/P partial hysterectomy   . Seasonal allergies   . Secondary hyperparathyroidism, renal [facility])     Patient Active Problem List   Diagnosis Date Noted  . ESRD (end stage renal disease) ([place]) 08/17/2017  . HTN (hypertension), benign 08/17/2017  . Hypothyroidism 08/17/2017  . Acute congestive heart failure ([place])   . Pleural effusion     Past Surgical History:  Procedure Laterality Date  . ABDOMINAL HYSTERECTOMY     PARTIAL  . AV FISTULA PLACEMENT Right 08/06/2017   Procedure: ARTERIOVENOUS (AV) FISTULA CREATION RIGHT ARM;  Surgeon: Elam Dutch, MD;  Location: [facility] OR;  Service: Vascular;  Laterality: Right;  . AV FISTULA PLACEMENT Right 08/20/2017   Procedure: INSERTION OF ARTERIOVENOUS (AV) GORE-TEX STRETCH GRAFT INTO RIGHT ARM;  Surgeon: Elam Dutch, MD;  Location:  [place];  Service: Vascular;  Laterality: Right;  . INSERTION OF DIALYSIS CATHETER N/A 08/17/2017   Procedure: INSERTION OF DIALYSIS CATHETER;  Surgeon: Rosetta Posner, MD;  [facility];  Service: Vascular;  Laterality: N/A;  . REVISION OF ARTERIOVENOUS GORETEX GRAFT Right 07/07/2018   Procedure: REVISION OF ARTERIOVENOUS GORETEX GRAFT;  Surgeon: Elam Dutch, MD;  Location: [place];  Service: Vascular;  Laterality: Right;  . SKIN BIOPSY      SKIN CANCER 6 AREAS  . TUBUALIGATION       OB History   None      Home Medications    Prior to Admission medications   Medication Sig Start Date End Date Taking? Authorizing Provider  acetaminophen (TYLENOL) 325 MG tablet Take 325 mg by mouth every 6 (six) hours as needed for mild pain.   Yes [provider]  atorvastatin (LIPITOR) 80 MG tablet Take 80 mg by mouth daily with breakfast.    Yes [provider]  calcium acetate (PHOSLO) 667 MG capsule Take 2 capsules (1,334 mg total) by mouth 3 (three) times daily with meals. Patient taking differently: Take 2,001 mg by mouth 2 (two) times daily.  08/21/17  Yes Rosita Fire, MD  carvedilol (COREG) 12.5 MG tablet Take 12.5 mg by mouth 2 (two) times daily. Do not take on dialysis days, Tus/Thurs/Sat   Yes [provider]  cholecalciferol 25  MCG (1000 UT) TABS Take 1,000 Units by mouth daily.   Yes [provider]  levothyroxine (SYNTHROID, LEVOTHROID) 75 MCG tablet Take 75 mcg by mouth daily before breakfast.   Yes [provider]  lidocaine-prilocaine (EMLA) cream Apply 1 application topically See admin instructions. Apply small amount to access site 1 to 2 hours prior to dialysis. 06/30/18  Yes [provider]  loratadine (CLARITIN) 10 MG tablet Take 10 mg by mouth daily with breakfast.    Yes [provider]  Multiple Vitamins-Minerals (ONE-A-DAY 50 PLUS PO) Take 1 tablet by mouth daily.   Yes [provider]    omeprazole (PRILOSEC) 20 MG capsule Take 20 mg by mouth daily before breakfast.    Yes [provider]  raloxifene (EVISTA) 60 MG tablet Take 60 mg by mouth daily with breakfast.    Yes [provider]  oxyCODONE-acetaminophen (PERCOCET) 5-325 MG tablet Take 1 tablet by mouth every 6 (six) hours as needed for up to 15 doses for severe pain. 09/17/18   Dagoberto Ligas, PA-C    Family History Family History  Problem Relation Age of Onset  . Stroke Mother   . Heart attack Father   . Heart attack Sister     Social History Social History   Tobacco Use  . Smoking status: Former Smoker    Types: Cigarettes  . Smokeless tobacco: Never Used  . Tobacco comment: quit smoking cigarettes in 1997  Substance Use Topics  . Alcohol use: No  . Drug use: No     Allergies   Patient has no known allergies.   Review of Systems Review of Systems  Constitutional: Negative for fever.  HENT: Negative for sore throat.   Eyes: Negative for visual disturbance.  Respiratory: Negative for cough and shortness of breath.   Cardiovascular: Negative for chest pain.  Gastrointestinal: Negative for abdominal pain.  Skin: Positive for wound. Negative for rash.  Neurological: Negative for syncope and headaches.     Physical Exam Updated Vital Signs BP (!) 158/88 (BP Location: Left Arm)   Pulse 98   Temp (!) 97.3 F (36.3 C)   Resp 16   Ht 5\' 9"  (1.753 m)   Wt 65.8 kg   SpO2 93%   BMI 21.41 kg/m   Physical Exam  Constitutional: She is oriented to person, place, and time. She appears well-developed and well-nourished. No distress.  HENT:  Head: Normocephalic and atraumatic.  Eyes: Conjunctivae and EOM are normal.  Neck: Normal range of motion.  Cardiovascular: Normal rate, regular rhythm, normal heart sounds and intact distal pulses. Exam reveals no gallop and no friction rub.  No murmur heard. Pulmonary/Chest: Effort normal and breath sounds normal. No respiratory  distress. She has no wheezes. She has no rales.  Musculoskeletal: She exhibits no edema or tenderness.  Right upper arm under dressing with catheters in place, no sign of bleeding  Neurological: She is alert and oriented to person, place, and time.  Skin: Skin is warm and dry. No rash noted. She is not diaphoretic. No erythema.  Nursing note and vitals reviewed.    ED Treatments / Results  Labs (all labs ordered are listed, but only abnormal results are displayed) Labs Reviewed  CBC WITH DIFFERENTIAL/PLATELET - Abnormal; Notable for the following components:      Result Value   RBC 3.58 (*)    Hemoglobin 10.8 (*)    MCV 100.8 (*)    MCHC 29.9 (*)    RDW 17.4 (*)  All other components within normal limits  POCT I-STAT 4, (NA,K, GLUC, HGB,HCT) - Abnormal; Notable for the following components:   HCT 28.0 (*)    Hemoglobin 9.5 (*)    All other components within normal limits  TYPE AND SCREEN    EKG None  Radiology No results found.  Procedures Procedures (including critical care time)  Medications Ordered in ED Medications  ceFAZolin (ANCEF) IVPB 1 g/50 mL premix ( Intravenous Automatically Held 09/18/18 0000)     Initial Impression / Assessment and Plan / ED Course  I have reviewed the triage vital signs and the nursing notes.  Pertinent labs & imaging results that were available during my care of the patient were reviewed by me and considered in my medical decision making (see chart for details).      77 year old female with history above including ESRD and s/p revision of fistula in August by Dr. Oneida Alar who presents from the vascular access clinic for concern of bleeding from her vascular access which occurred during procedure. Radial pulse present. Hemostatic at this time with kerlix dressing and balloon cathter in place.  There was note of Dr. Donnetta Hutching being paged on arrival, however apparently the page did not go through, however Dr. Donnetta Hutching came to ED after his  surgery to assess patient and will take her to the OR.  CBC, BMP, type and screen all ordered.   Final Clinical Impressions(s) / ED Diagnoses   Final diagnoses:  ESRD (end stage renal disease) ([place])  Malfunction of arteriovenous shunt, initial encounter ([place])  Complication of vascular access for dialysis, initial encounter    ED Discharge Orders         Ordered    oxyCODONE-acetaminophen (PERCOCET) 5-325 MG tablet  Every 6 hours PRN     09/17/18 1520              Gareth Morgan, MD 09/17/18 2035

## 2018-09-17 NOTE — Transfer of Care (Signed)
Immediate Anesthesia Transfer of Care Note  Patient: Latoya Lopez  Procedure(s) Performed: Tiana Loft AND REVISION OF ARTERIOVENTOUS (AV) GORETEX  GRAFT (Right Arm Upper)  Patient Location: PACU  Anesthesia Type:General  Level of Consciousness: awake, alert  and oriented  Airway & Oxygen Therapy: Patient Spontanous Breathing and Patient connected to nasal cannula oxygen  Post-op Assessment: Report given to RN and Post -op Vital signs reviewed and stable  Post vital signs: Reviewed and stable  Last Vitals:  Vitals Value Taken Time  BP 169/92 09/17/2018  3:24 PM  Temp    Pulse 110 09/17/2018  3:27 PM  Resp 20 09/17/2018  3:27 PM  SpO2 100 % 09/17/2018  3:27 PM  Vitals shown include unvalidated device data.  Last Pain:  Vitals:   09/17/18 1045  TempSrc:   PainSc: 0-No pain         Complications: No apparent anesthesia complications

## 2018-09-17 NOTE — Consult Note (Addendum)
Hospital Consult    Reason for Consult:  Bleeding access Requesting Physician:  Dr. Augustin Coupe and ER MRN #:  [id number]  History of Present Illness: This is a 77 y.o. female who is s/p revision of the RUA AVG by Dr. Oneida Alar on 07/07/18.  She was having a percutaneous intervention this morning with Dr. Augustin Coupe and this was complicated by bleeding.  She was sent to the ER for surgical management.  She last ate yesterday around 3 or 4pm.   Past Medical History:  Diagnosis Date  . Anemia of renal disease   . Cancer ([place])    skin  . Chronic kidney disease    Stage IV  . GERD (gastroesophageal reflux disease)   . Hyperkalemia   . Hyperlipidemia   . Hyperphosphatemia   . Hypertension    Essential with goal blood pressure less than 130/80  . Hypokalemia   . Hypoparathyroidism ([place])   . Hypothyroidism   . Membranous glomerulonephritis   . Proteinuria   . S/P partial hysterectomy   . Seasonal allergies   . Secondary hyperparathyroidism, renal [facility])     Past Surgical History:  Procedure Laterality Date  . ABDOMINAL HYSTERECTOMY     PARTIAL  . AV FISTULA PLACEMENT Right 08/06/2017   Procedure: ARTERIOVENOUS (AV) FISTULA CREATION RIGHT ARM;  Surgeon: Elam Dutch, MD;  Location: [facility] OR;  Service: Vascular;  Laterality: Right;  . AV FISTULA PLACEMENT Right 08/20/2017   Procedure: INSERTION OF ARTERIOVENOUS (AV) GORE-TEX STRETCH GRAFT INTO RIGHT ARM;  Surgeon: Elam Dutch, MD;  Location: [place];  Service: Vascular;  Laterality: Right;  . INSERTION OF DIALYSIS CATHETER N/A 08/17/2017   Procedure: INSERTION OF DIALYSIS CATHETER;  Surgeon: Rosetta Posner, MD;  Location: [facility] OR;  Service: Vascular;  Laterality: N/A;  . REVISION OF ARTERIOVENOUS GORETEX GRAFT Right 07/07/2018   Procedure: REVISION OF ARTERIOVENOUS GORETEX GRAFT;  Surgeon: Elam Dutch, MD;  Location: [place];  Service: Vascular;  Laterality: Right;  . SKIN BIOPSY      SKIN CANCER 6 AREAS  . TUBUALIGATION      No Known  Allergies  Prior to Admission medications   Medication Sig Start Date End Date Taking? Authorizing Provider  acetaminophen (TYLENOL) 325 MG tablet Take 325 mg by mouth every 6 (six) hours as needed for mild pain.   Yes [provider]  atorvastatin (LIPITOR) 80 MG tablet Take 80 mg by mouth daily with breakfast.    Yes [provider]  calcium acetate (PHOSLO) 667 MG capsule Take 2 capsules (1,334 mg total) by mouth 3 (three) times daily with meals. Patient taking differently: Take 2,001 mg by mouth 2 (two) times daily.  08/21/17  Yes Rosita Fire, MD  carvedilol (COREG) 12.5 MG tablet Take 12.5 mg by mouth 2 (two) times daily. Do not take on dialysis days, Tus/Thurs/Sat   Yes [provider]  cholecalciferol 25 MCG (1000 UT) TABS Take 1,000 Units by mouth daily.   Yes [provider]  levothyroxine (SYNTHROID, LEVOTHROID) 75 MCG tablet Take 75 mcg by mouth daily before breakfast.   Yes [provider]  lidocaine-prilocaine (EMLA) cream Apply 1 application topically See admin instructions. Apply small amount to access site 1 to 2 hours prior to dialysis. 06/30/18  Yes [provider]  loratadine (CLARITIN) 10 MG tablet Take 10 mg by mouth daily with breakfast.    Yes [provider]  Multiple Vitamins-Minerals (ONE-A-DAY 50 PLUS PO) Take 1 tablet by mouth  daily.   Yes [provider]  omeprazole (PRILOSEC) 20 MG capsule Take 20 mg by mouth daily before breakfast.    Yes [provider]  raloxifene (EVISTA) 60 MG tablet Take 60 mg by mouth daily with breakfast.    Yes [provider]  oxyCODONE-acetaminophen (PERCOCET/ROXICET) 5-325 MG tablet Take 1 tablet by mouth every 4 (four) hours as needed. Patient not taking: Reported on 09/17/2018 07/07/18   Ulyses Amor, PA-C    Social History   Socioeconomic History  . Marital status: Married    Spouse name: Not on file  . Number of children: Not on  file  . Years of education: Not on file  . Highest education level: Not on file  Occupational History  . Not on file  Social Needs  . Financial resource strain: Not on file  . Food insecurity:    Worry: Not on file    Inability: Not on file  . Transportation needs:    Medical: Not on file    Non-medical: Not on file  Tobacco Use  . Smoking status: Former Smoker    Types: Cigarettes  . Smokeless tobacco: Never Used  . Tobacco comment: quit smoking cigarettes in 1997  Substance and Sexual Activity  . Alcohol use: No  . Drug use: No  . Sexual activity: Not on file  Lifestyle  . Physical activity:    Days per week: Not on file    Minutes per session: Not on file  . Stress: Not on file  Relationships  . Social connections:    Talks on phone: Not on file    Gets together: Not on file    Attends religious service: Not on file    Active member of club or organization: Not on file    Attends meetings of clubs or organizations: Not on file    Relationship status: Not on file  . Intimate partner violence:    Fear of current or ex partner: Not on file    Emotionally abused: Not on file    Physically abused: Not on file    Forced sexual activity: Not on file  Other Topics Concern  . Not on file  Social History Narrative  . Not on file    Family History  Problem Relation Age of Onset  . Stroke Mother   . Heart attack Father   . Heart attack Sister     ROS: [x]  Positive   [ ]  Negative   [ ]  All sytems reviewed and are negative  Cardiac: [x]  high blood pressure [x]  high cholesterol  Vascular: []  pain in legs while walking []  pain in legs at rest []  pain in legs at night []  non-healing ulcers []  hx of DVT []  swelling in legs  Pulmonary: []  productive cough []  asthma/wheezing []  home O2  Neurologic: []  weakness in []  arms []  legs []  numbness in []  arms []  legs []  hx of CVA []  mini stroke [] difficulty speaking or slurred speech []  temporary loss of vision in  one eye []  dizziness  Hematologic: [x]  hx of cancer-skin  Endocrine:  [x]  thyroid disease  GI [x]  GERD   GU: [x]  CKD/renal failure [x]  HD--[]  M/W/F or [x]  T/T/S in [place]   Psychiatric: []  anxiety []  depression  Musculoskeletal: []  arthritis []  joint pain  Integumentary: []  rashes []  ulcers  Constitutional: []  fever []  chills   Physical Examination  Vitals:   09/17/18 1037 09/17/18 1045  BP: (!) 175/98 (!) 165/90  Pulse:  84 84  Resp: 20 17  Temp: 98.1 F (36.7 C)   SpO2: 95% 96%   Body mass index is 21.41 kg/m.  General:  WDWN in NAD Gait: Not observed HENT: WNL, normocephalic Pulmonary: normal non-labored breathing, without Rales, rhonchi,  wheezing Cardiac: regular Skin: without rashes Vascular Exam/Pulses: Right arm with Kerlix-bandaged with balloon syringe present Extremities: + palpable right radial pulse Musculoskeletal: no muscle wasting or atrophy  Neurologic: A&O X 3;  No focal weakness or paresthesias are detected; speech is fluent/normal Psychiatric:  The pt has Normal affect.   CBC    Component Value Date/Time   WBC 6.9 07/07/2018 1254   RBC 3.32 (L) 07/07/2018 1254   HGB 10.1 (L) 07/07/2018 1254   HCT 34.1 (L) 07/07/2018 1254   PLT 121 (L) 07/07/2018 1254   MCV 102.7 (H) 07/07/2018 1254   MCH 30.4 07/07/2018 1254   MCHC 29.6 (L) 07/07/2018 1254   RDW 15.4 07/07/2018 1254    BMET    Component Value Date/Time   NA 141 07/07/2018 1225   K 3.8 07/07/2018 1225   CL 101 07/07/2018 1225   CO2 25 07/07/2018 1225   GLUCOSE 101 (H) 07/07/2018 1225   BUN 67 (H) 07/07/2018 1225   CREATININE 8.95 (H) 07/07/2018 1225   CALCIUM 9.3 07/07/2018 1225   GFRNONAA 4 (L) 07/07/2018 1225   GFRAA 4 (L) 07/07/2018 1225    COAGS: Lab Results  Component Value Date   INR 1.00 08/20/2017   INR 1.23 08/16/2017     Non-Invasive Vascular Imaging:   none  Statin:  Yes.   Beta Blocker:  Yes.   Aspirin:  No. ACEI:  No. ARB:  No. CCB  use:  No Other antiplatelets/anticoagulants:  No.    ASSESSMENT/PLAN: This is a 77 y.o. female with ESRD on dialysis T/T/S in [place] who is s/p revision of the fistula in August by Dr. Oneida Alar who was undergoing a percutaneous intervention today complicated by by bleeding    -pt will be taken urgently to the operating room for surgical management.  Dr. Donnetta Hutching discussed with the pt that he is hopeful that he can repair the graft, but there is a chance that it will not be salvageable and she will need a tunneled dialysis catheter.  Pt & family expresses understanding.   Leontine Locket, PA-C [facility] [phone]   I agree with the above.  Right arm wrapped in gauze with palpable radial pulse.  Discussed revision vs ligation of graft and possible catheter.  Annamarie Major

## 2018-09-17 NOTE — Progress Notes (Signed)
Per Dr. Trula Slade, do not take dressing down to assess graft for Bruit or Thrill.

## 2018-09-17 NOTE — Op Note (Signed)
Patient name: Latoya Lopez MRN: [id number] DOB: 08-19-1941 Sex: female  09/17/2018 Pre-operative Diagnosis: Thrombosed right upper extremity Gore-Tex graft Post-operative diagnosis:  Same Surgeon:  Annamarie Major Assistants: Arlee Muslim Procedure:   #1: Thrombectomy of right upper extremity AV Gore-Tex graft   #2: Excision of exposed Gore-Tex graft in the mid upper arm and primary closure of the skin   #3: Revision of Gore-Tex graft by placement of a new interposition 6 mm Gore-Tex graft Anesthesia: General Blood Loss: 100 cc Specimens: None  Findings: There was an exposed stent within a area of skin breakdown in the mid upper arm.  I resected the graft and stent in this area.  A new 23mm graft was tunneled lateral to the existing graft after thrombectomy was performed.  Indications: A few weeks ago the patient had undergone repair of a ulcerated portion of her Gore-Tex graft.  Her graft occluded earlier today and an attempted salvage was performed at the kidney center.  Graft rupture and exposed stent was encountered and the patient was transferred to the emergency room for surgical repair.  She still had balloon occlusion in place of her graft.  I discussed with the patient that she may require excision of the graft versus a revision and possible placement of a tunneled catheter.  Procedure:  The patient was identified in the holding area and taken to [place] [facility]  The patient was then placed supine on the table. general anesthesia was administered.  The patient was prepped and draped in the usual sterile fashion.  A time out was called and antibiotics were administered.  The patient had dual access sites of her graft with balloons inflated for hemostasis.  These were removed.  There was no active bleeding.  In the midportion of the graft, there had been erosion through the skin with exposure of a stent through the skin.  I initially began by making transverse incisions over the graft,  proximal and distal to the exposed stent.  Through these incisions I dissected out the graft which was well incorporated.  The graft was circumferentially mobilized and encircled with Vesseloops to these incisions.  I then created a tunnel between the 2 incisions, going lateral to the existing graft.  The patient was then fully heparinized.  I initially transected the graft through the incision closest to the shoulder.  It was occluded.  I then passed a #4 Fogarty catheter into the central venous system and perform thrombectomy, removing acute thrombus.  After several passes, adequate backbleeding was reestablished.  The graft was flushed with heparin saline and reoccluded.  There was a large amount of neointima that this portion of the graft which was removed.  I then transected the graft within the incision closest to the antecubital crease.  A #4 Fogarty catheter was then passed into the brachial artery and thrombectomy of the graft was performed, removing the arterial plug.  A second pass of the Fogarty catheter was performed and no thrombus was evacuated.  There was excellent bleeding to the graft.  A new 6 mm Gore-Tex graft was brought onto the field and passed through the previously created tunnel.  A end to end anastomosis was created between both ends of the new graft and the old graft with running 6-0 Prolene.  Prior to completion, the Foley catheter was used to perform a final thrombectomy in both directions.  There was good backbleeding and brisk inflow.  The anastomosis was completed.  There was an  excellent thrill within the graft.  Next, I made an elliptical incision around the exposed stent and existing defunctionalized graft.  The graft and stent were then removed in its entirety.  The patient had diffuse oozing from raw surface areas.  Cautery was used to aid with hemostasis as well as administration of protamine.  Once I had good hemostasis, I reapproximated the subcutaneous tissue in the middle  incision with 3-0 Vicryl.  The skin edges were very irritated and so I closed these with interrupted 3-0 nylon mattress sutures.  The incision closest to the antecubital crease was then closed with 2 layers of 3-0 Vicryl.  The incision closest to the shoulder was closed with a deep layer 3-0 Vicryl followed by interrupted vertical mattress 3-0 nylon sutures.  Sterile dressings were then applied followed by an Ace wrap.  There was a palpable thrill within the graft and a palpable radial pulse.  The patient was then successfully extubated taken to recovery in stable condition.   Disposition: To PACU stable   V. Annamarie Major, M.D. [facility] [place] Office: [phone] Pager:  [phone]

## 2018-09-17 NOTE — ED Triage Notes (Signed)
Pt here from dialysis center with declotting procedure this morning. Per center, fistula surgery site opened up. Pt transferred here for further surgical management. Dressing clean, dry, intact. Pt awake, alert, appropriate, NAD at present.

## 2018-09-18 ENCOUNTER — Encounter (HOSPITAL_COMMUNITY): Payer: Self-pay | Admitting: Surgery

## 2018-09-19 ENCOUNTER — Telehealth: Payer: Self-pay | Admitting: Vascular Surgery

## 2018-09-19 NOTE — Telephone Encounter (Signed)
-----   Message from Mena Goes, RN sent at 09/17/2018  3:33 PM EST ----- Regarding: 2-3 weeks [place] [facility] clinic OK   ----- Message ----- From: Iline Oven Sent: 09/17/2018   3:19 PM EST To: [facility] Admin Pool, [facility] Charge Pool   Can you schedule an appt for this pt in 2-3 weeks for suture removal.  PO R arm AVG revision. Thanks, MAtt

## 2018-09-19 NOTE — Telephone Encounter (Signed)
sch appt spk to pt 10/01/18 330pm suture removal

## 2018-09-20 ENCOUNTER — Inpatient Hospital Stay (HOSPITAL_COMMUNITY)
Admission: EM | Admit: 2018-09-20 | Discharge: 2018-09-22 | DRG: 314 | Disposition: A | Discharge status: Home / Self Care | Payer: Medicare Other | Attending: Internal Medicine | Admitting: Internal Medicine

## 2018-09-20 ENCOUNTER — Encounter (HOSPITAL_COMMUNITY): Payer: Self-pay | Admitting: Emergency Medicine

## 2018-09-20 DIAGNOSIS — Y832 Surgical operation with anastomosis, bypass or graft as the cause of abnormal reaction of the patient, or of later complication, without mention of misadventure at the time of the procedure: Secondary | ICD-10-CM | POA: Diagnosis present

## 2018-09-20 DIAGNOSIS — T82590A Other mechanical complication of surgically created arteriovenous fistula, initial encounter: Secondary | ICD-10-CM | POA: Diagnosis present

## 2018-09-20 DIAGNOSIS — Z79890 Hormone replacement therapy (postmenopausal): Secondary | ICD-10-CM

## 2018-09-20 DIAGNOSIS — K219 Gastro-esophageal reflux disease without esophagitis: Secondary | ICD-10-CM | POA: Diagnosis present

## 2018-09-20 DIAGNOSIS — Z87891 Personal history of nicotine dependence: Secondary | ICD-10-CM | POA: Diagnosis not present

## 2018-09-20 DIAGNOSIS — J302 Other seasonal allergic rhinitis: Secondary | ICD-10-CM | POA: Diagnosis present

## 2018-09-20 DIAGNOSIS — I10 Essential (primary) hypertension: Secondary | ICD-10-CM | POA: Diagnosis present

## 2018-09-20 DIAGNOSIS — E785 Hyperlipidemia, unspecified: Secondary | ICD-10-CM | POA: Diagnosis present

## 2018-09-20 DIAGNOSIS — N2581 Secondary hyperparathyroidism of renal origin: Secondary | ICD-10-CM | POA: Diagnosis present

## 2018-09-20 DIAGNOSIS — Z79899 Other long term (current) drug therapy: Secondary | ICD-10-CM

## 2018-09-20 DIAGNOSIS — Y712 Prosthetic and other implants, materials and accessory cardiovascular devices associated with adverse incidents: Secondary | ICD-10-CM | POA: Diagnosis present

## 2018-09-20 DIAGNOSIS — E039 Hypothyroidism, unspecified: Secondary | ICD-10-CM | POA: Diagnosis present

## 2018-09-20 DIAGNOSIS — Z992 Dependence on renal dialysis: Secondary | ICD-10-CM | POA: Diagnosis not present

## 2018-09-20 DIAGNOSIS — Z66 Do not resuscitate: Secondary | ICD-10-CM | POA: Diagnosis present

## 2018-09-20 DIAGNOSIS — N186 End stage renal disease: Secondary | ICD-10-CM | POA: Diagnosis present

## 2018-09-20 DIAGNOSIS — T82838A Hemorrhage of vascular prosthetic devices, implants and grafts, initial encounter: Principal | ICD-10-CM | POA: Diagnosis present

## 2018-09-20 DIAGNOSIS — I120 Hypertensive chronic kidney disease with stage 5 chronic kidney disease or end stage renal disease: Secondary | ICD-10-CM | POA: Diagnosis present

## 2018-09-20 DIAGNOSIS — E8889 Other specified metabolic disorders: Secondary | ICD-10-CM | POA: Diagnosis present

## 2018-09-20 DIAGNOSIS — Z85828 Personal history of other malignant neoplasm of skin: Secondary | ICD-10-CM | POA: Diagnosis not present

## 2018-09-20 DIAGNOSIS — Z419 Encounter for procedure for purposes other than remedying health state, unspecified: Secondary | ICD-10-CM

## 2018-09-20 LAB — BASIC METABOLIC PANEL
ANION GAP: 10 (ref 5–15)
BUN: 55 mg/dL — ABNORMAL HIGH (ref 8–23)
CHLORIDE: 109 mmol/L (ref 98–111)
CO2: 21 mmol/L — AB (ref 22–32)
Calcium: 9.5 mg/dL (ref 8.9–10.3)
Creatinine, Ser: 7.82 mg/dL — ABNORMAL HIGH (ref 0.44–1.00)
GFR calc non Af Amer: 4 mL/min — ABNORMAL LOW (ref >60)
GFR, EST AFRICAN AMERICAN: 5 mL/min — AB (ref >60)
Glucose, Bld: 96 mg/dL (ref 70–99)
POTASSIUM: 4.9 mmol/L (ref 3.5–5.1)
SODIUM: 140 mmol/L (ref 135–145)

## 2018-09-20 LAB — CBC WITH DIFFERENTIAL/PLATELET
ABS IMMATURE GRANULOCYTES: 0.02 10*3/uL (ref 0.00–0.07)
BASOS ABS: 0.1 10*3/uL (ref 0.0–0.1)
Basophils Relative: 1 %
EOS PCT: 10 %
Eosinophils Absolute: 0.5 10*3/uL (ref 0.0–0.5)
HEMATOCRIT: 30.3 % — AB (ref 36.0–46.0)
HEMOGLOBIN: 8.7 g/dL — AB (ref 12.0–15.0)
IMMATURE GRANULOCYTES: 0 %
LYMPHS PCT: 26 %
Lymphs Abs: 1.3 10*3/uL (ref 0.7–4.0)
MCH: 30.5 pg (ref 26.0–34.0)
MCHC: 28.7 g/dL — ABNORMAL LOW (ref 30.0–36.0)
MCV: 106.3 fL — ABNORMAL HIGH (ref 80.0–100.0)
Monocytes Absolute: 0.5 10*3/uL (ref 0.1–1.0)
Monocytes Relative: 11 %
NEUTROS ABS: 2.6 10*3/uL (ref 1.7–7.7)
NRBC: 0 % (ref 0.0–0.2)
Neutrophils Relative %: 52 %
Platelets: 135 10*3/uL — ABNORMAL LOW (ref 150–400)
RBC: 2.85 MIL/uL — AB (ref 3.87–5.11)
RDW: 17.2 % — ABNORMAL HIGH (ref 11.5–15.5)
WBC: 4.9 10*3/uL (ref 4.0–10.5)

## 2018-09-20 MED ORDER — CALCIUM ACETATE (PHOS BINDER) 667 MG PO CAPS
1334.0000 mg | ORAL_CAPSULE | Freq: Three times a day (TID) | ORAL | Status: DC
  Administered 2018-09-20 – 2018-09-22 (×4): 1334 mg via ORAL
  Filled 2018-09-20 (×6): qty 2

## 2018-09-20 MED ORDER — ACETAMINOPHEN 325 MG PO TABS: 650.0000 mg | ORAL_TABLET | Freq: Four times a day (QID) | ORAL | Status: DC | PRN

## 2018-09-20 MED ORDER — SODIUM CHLORIDE 0.9% FLUSH
3.0000 mL | Freq: Two times a day (BID) | INTRAVENOUS | Status: DC
  Administered 2018-09-20 – 2018-09-21 (×2): 3 mL via INTRAVENOUS

## 2018-09-20 MED ORDER — CARVEDILOL 12.5 MG PO TABS
12.5000 mg | ORAL_TABLET | ORAL | Status: DC
  Administered 2018-09-21 (×2): 12.5 mg via ORAL
  Filled 2018-09-20 (×2): qty 1

## 2018-09-20 MED ORDER — LIDOCAINE-PRILOCAINE 2.5-2.5 % EX CREA: 1.0000 "application " | TOPICAL_CREAM | CUTANEOUS | Status: DC

## 2018-09-20 MED ORDER — OXYCODONE-ACETAMINOPHEN 5-325 MG PO TABS: 1.0000 | ORAL_TABLET | Freq: Four times a day (QID) | ORAL | Status: DC | PRN

## 2018-09-20 MED ORDER — ONDANSETRON HCL 4 MG/2ML IJ SOLN: 4.0000 mg | Freq: Four times a day (QID) | INTRAMUSCULAR | Status: DC | PRN

## 2018-09-20 MED ORDER — HYDROMORPHONE HCL 1 MG/ML IJ SOLN
0.5000 mg | INTRAMUSCULAR | Status: DC | PRN
  Administered 2018-09-21: 0.5 mg via INTRAVENOUS
  Filled 2018-09-20: qty 1

## 2018-09-20 MED ORDER — ZOLPIDEM TARTRATE 5 MG PO TABS: 5.0000 mg | ORAL_TABLET | Freq: Every evening | ORAL | Status: DC | PRN

## 2018-09-20 MED ORDER — LEVOTHYROXINE SODIUM 75 MCG PO TABS
75.0000 ug | ORAL_TABLET | Freq: Every day | ORAL | Status: DC
  Administered 2018-09-21 – 2018-09-22 (×2): 75 ug via ORAL
  Filled 2018-09-20 (×2): qty 1

## 2018-09-20 MED ORDER — ACETAMINOPHEN 325 MG PO TABS: 325.0000 mg | ORAL_TABLET | Freq: Four times a day (QID) | ORAL | Status: DC | PRN

## 2018-09-20 MED ORDER — HYDROCODONE-ACETAMINOPHEN 5-325 MG PO TABS
1.0000 | ORAL_TABLET | ORAL | Status: DC | PRN
  Administered 2018-09-21: 1 via ORAL
  Filled 2018-09-20: qty 1

## 2018-09-20 MED ORDER — SODIUM CHLORIDE 0.9 % IV SOLN
250.0000 mL | INTRAVENOUS | Status: DC | PRN
  Administered 2018-09-21: 07:00:00 via INTRAVENOUS

## 2018-09-20 MED ORDER — SODIUM CHLORIDE 0.9% FLUSH: 3.0000 mL | INTRAVENOUS | Status: DC | PRN

## 2018-09-20 MED ORDER — ACETAMINOPHEN 650 MG RE SUPP: 650.0000 mg | Freq: Four times a day (QID) | RECTAL | Status: DC | PRN

## 2018-09-20 MED ORDER — LORATADINE 10 MG PO TABS
10.0000 mg | ORAL_TABLET | Freq: Every day | ORAL | Status: DC
  Administered 2018-09-21 – 2018-09-22 (×2): 10 mg via ORAL
  Filled 2018-09-20 (×3): qty 1

## 2018-09-20 MED ORDER — PANTOPRAZOLE SODIUM 40 MG PO TBEC
40.0000 mg | DELAYED_RELEASE_TABLET | Freq: Every day | ORAL | Status: DC
  Administered 2018-09-21 – 2018-09-22 (×2): 40 mg via ORAL
  Filled 2018-09-20 (×2): qty 1

## 2018-09-20 MED ORDER — ATORVASTATIN CALCIUM 80 MG PO TABS
80.0000 mg | ORAL_TABLET | Freq: Every day | ORAL | Status: DC
  Administered 2018-09-21 – 2018-09-22 (×2): 80 mg via ORAL
  Filled 2018-09-20 (×3): qty 1

## 2018-09-20 MED ORDER — RALOXIFENE HCL 60 MG PO TABS
60.0000 mg | ORAL_TABLET | Freq: Every day | ORAL | Status: DC
  Administered 2018-09-21: 60 mg via ORAL
  Filled 2018-09-20 (×3): qty 1

## 2018-09-20 MED ORDER — ONDANSETRON HCL 4 MG PO TABS: 4.0000 mg | ORAL_TABLET | Freq: Four times a day (QID) | ORAL | Status: DC | PRN

## 2018-09-20 NOTE — ED Notes (Signed)
Dr. Carlis Abbott vascular surgery bedside.

## 2018-09-20 NOTE — Progress Notes (Signed)
Admitted from ED for a bleeding fistula right arm, IV nurse deaccssing the fistula at this time per MD order. Patient is  is alert and oriented o admission, oriented to unit and staff. Will continue to monitor.

## 2018-09-20 NOTE — Consult Note (Signed)
Hospital Consult    Reason for Consult:  Bleeding from right arm AV graft revision Referring Physician:  ED MRN #:  [id number]  History of Present Illness: This is a 77 y.o. female with multiple medical problems including end-stage renal disease on hemodialysis that vascular surgery has been consulted for bleeding from her right upper extremity AV graft after revision earlier this week.  Patient underwent AV graft revision on Wednesday by Dr. Trula Slade with thrombectomy of her right upper extremity graft, excision of an exposed segment of graft, and placement of a new interposition tunneled laterally.  She was at her dialysis center today in [place] and reportedly when they accessed her graft above the revision on her upper arm she had significant bleeding problem from the upper incision.  On my evaluation in the ED and at the time of consultation there was no active bleeding and hemostasis had been achieved prior to arrival.  There was concern about a significant amount of ecchymosis in her arm.  Patient still has a good thrill.  She states her last full dialysis session was Thursday.  She denies being on any other blood thinners.  Past Medical History:  Diagnosis Date  . Anemia of renal disease   . Cancer ([place])    skin  . Chronic kidney disease    Stage IV  . GERD (gastroesophageal reflux disease)   . Hyperkalemia   . Hyperlipidemia   . Hyperphosphatemia   . Hypertension    Essential with goal blood pressure less than 130/80  . Hypokalemia   . Hypoparathyroidism ([place])   . Hypothyroidism   . Membranous glomerulonephritis   . Proteinuria   . S/P partial hysterectomy   . Seasonal allergies   . Secondary hyperparathyroidism, renal [facility])     Past Surgical History:  Procedure Laterality Date  . ABDOMINAL HYSTERECTOMY     PARTIAL  . AV FISTULA PLACEMENT Right 08/06/2017   Procedure: ARTERIOVENOUS (AV) FISTULA CREATION RIGHT ARM;  Surgeon: Elam Dutch, MD;  Location: [facility] OR;   Service: Vascular;  Laterality: Right;  . AV FISTULA PLACEMENT Right 08/20/2017   Procedure: INSERTION OF ARTERIOVENOUS (AV) GORE-TEX STRETCH GRAFT INTO RIGHT ARM;  Surgeon: Elam Dutch, MD;  Location: [place];  Service: Vascular;  Laterality: Right;  . INSERTION OF DIALYSIS CATHETER N/A 08/17/2017   Procedure: INSERTION OF DIALYSIS CATHETER;  Surgeon: Rosetta Posner, MD;  Location: [facility] OR;  Service: Vascular;  Laterality: N/A;  . REVISION OF ARTERIOVENOUS GORETEX GRAFT Right 07/07/2018   Procedure: REVISION OF ARTERIOVENOUS GORETEX GRAFT;  Surgeon: Elam Dutch, MD;  Location: [place];  Service: Vascular;  Laterality: Right;  . SKIN BIOPSY      SKIN CANCER 6 AREAS  . THROMBECTOMY AND REVISION OF ARTERIOVENTOUS (AV) GORETEX  GRAFT Right 09/17/2018   Procedure: THROMBECTOMY AND REVISION OF ARTERIOVENTOUS (AV) GORETEX  GRAFT;  Surgeon: Serafina Mitchell, MD;  Location: [facility] OR;  Service: Vascular;  Laterality: Right;  . TUBUALIGATION      No Known Allergies  Prior to Admission medications   Medication Sig Start Date End Date Taking? Authorizing Provider  acetaminophen (TYLENOL) 325 MG tablet Take 325 mg by mouth every 6 (six) hours as needed for mild pain.   Yes [provider]  atorvastatin (LIPITOR) 80 MG tablet Take 80 mg by mouth daily with breakfast.    Yes [provider]  calcium acetate (PHOSLO) 667 MG capsule Take 2 capsules (1,334 mg total) by mouth 3 (three) times  daily with meals. Patient taking differently: Take 2,001 mg by mouth 2 (two) times daily.  08/21/17  Yes Rosita Fire, MD  carvedilol (COREG) 12.5 MG tablet Take 12.5 mg by mouth 2 (two) times daily. Do not take on dialysis days, Tus/Thurs/Sat   Yes [provider]  levothyroxine (SYNTHROID, LEVOTHROID) 75 MCG tablet Take 75 mcg by mouth daily before breakfast.   Yes [provider]  lidocaine-prilocaine (EMLA) cream Apply 1 application topically See admin instructions. Apply small  amount to access site 1 to 2 hours prior to dialysis. 06/30/18  Yes [provider]  loratadine (CLARITIN) 10 MG tablet Take 10 mg by mouth daily with breakfast.    Yes [provider]  Multiple Vitamins-Minerals (ONE-A-DAY 50 PLUS PO) Take 1 tablet by mouth daily.   Yes [provider]  omeprazole (PRILOSEC) 20 MG capsule Take 20 mg by mouth daily before breakfast.    Yes [provider]  raloxifene (EVISTA) 60 MG tablet Take 60 mg by mouth daily with breakfast.    Yes [provider]  oxyCODONE-acetaminophen (PERCOCET) 5-325 MG tablet Take 1 tablet by mouth every 6 (six) hours as needed for up to 15 doses for severe pain. 09/17/18   Dagoberto Ligas, PA-C    Social History   Socioeconomic History  . Marital status: Married    Spouse name: Not on file  . Number of children: Not on file  . Years of education: Not on file  . Highest education level: Not on file  Occupational History  . Not on file  Social Needs  . Financial resource strain: Not on file  . Food insecurity:    Worry: Not on file    Inability: Not on file  . Transportation needs:    Medical: Not on file    Non-medical: Not on file  Tobacco Use  . Smoking status: Former Smoker    Types: Cigarettes  . Smokeless tobacco: Never Used  . Tobacco comment: quit smoking cigarettes in 1997  Substance and Sexual Activity  . Alcohol use: No  . Drug use: No  . Sexual activity: Not on file  Lifestyle  . Physical activity:    Days per week: Not on file    Minutes per session: Not on file  . Stress: Not on file  Relationships  . Social connections:    Talks on phone: Not on file    Gets together: Not on file    Attends religious service: Not on file    Active member of club or organization: Not on file    Attends meetings of clubs or organizations: Not on file    Relationship status: Not on file  . Intimate partner violence:    Fear of current or ex partner: Not on file     Emotionally abused: Not on file    Physically abused: Not on file    Forced sexual activity: Not on file  Other Topics Concern  . Not on file  Social History Narrative  . Not on file     Family History  Problem Relation Age of Onset  . Stroke Mother   . Heart attack Father   . Heart attack Sister     ROS: [x]  Positive   [ ]  Negative   [ ]  All sytems reviewed and are negative  Cardiovascular: []  chest pain/pressure []  palpitations []  SOB lying flat []  DOE []  pain in legs while walking []  pain in legs at rest []  pain  in legs at night []  non-healing ulcers []  hx of DVT []  swelling in legs  Pulmonary: []  productive cough []  asthma/wheezing []  home O2  Neurologic: []  weakness in []  arms []  legs []  numbness in []  arms []  legs []  hx of CVA []  mini stroke [] difficulty speaking or slurred speech []  temporary loss of vision in one eye []  dizziness  Hematologic: []  hx of cancer []  bleeding problems []  problems with blood clotting easily  Endocrine:   []  diabetes []  thyroid disease  GI []  vomiting blood []  blood in stool  GU: [x]  CKD/renal failure [x]  HD--[]  M/W/F or []  T/T/S []  burning with urination []  blood in urine  Psychiatric: []  anxiety []  depression  Musculoskeletal: []  arthritis []  joint pain  Integumentary: []  rashes []  ulcers  Constitutional: []  fever []  chills   Physical Examination  Vitals:   09/20/18 1530 09/20/18 1711  BP: 112/70 (!) 177/79  Pulse: 65 97  Resp: 13 18  Temp:  98.3 F (36.8 C)  SpO2: 98% 99%   There is no height or weight on file to calculate BMI.  General:  NAD Gait: Not observed HENT: WNL, normocephalic Pulmonary: normal non-labored breathing, without Rales, rhonchi,  wheezing Cardiac: regular, without  Murmurs, rubs or gallops Abdomen: soft, NT/ND, no masses Skin: Ecchymosis to right arm where new graft tunneled Vascular Exam/Pulses:  Right Left  Radial 2+ (normal)   Ulnar    Femoral    Popliteal     DP    PT    Good thrill in RUE graft, nylon incisions c/d/i at site of graft incision and proximal and distal incision where new graft interposition tunneled Extremities: without ischemic changes, without Gangrene , without cellulitis; without open wounds;  Musculoskeletal: no muscle wasting or atrophy  Neurologic: A&O X 3; Appropriate Affect ; SENSATION: normal; MOTOR FUNCTION:  moving all extremities equally. Speech is fluent/normal   CBC    Component Value Date/Time   WBC 4.9 09/20/2018 1310   RBC 2.85 (L) 09/20/2018 1310   HGB 8.7 (L) 09/20/2018 1310   HCT 30.3 (L) 09/20/2018 1310   PLT 135 (L) 09/20/2018 1310   MCV 106.3 (H) 09/20/2018 1310   MCH 30.5 09/20/2018 1310   MCHC 28.7 (L) 09/20/2018 1310   RDW 17.2 (H) 09/20/2018 1310   LYMPHSABS 1.3 09/20/2018 1310   MONOABS 0.5 09/20/2018 1310   EOSABS 0.5 09/20/2018 1310   BASOSABS 0.1 09/20/2018 1310    BMET    Component Value Date/Time   NA 140 09/20/2018 1310   K 4.9 09/20/2018 1310   CL 109 09/20/2018 1310   CO2 21 (L) 09/20/2018 1310   GLUCOSE 96 09/20/2018 1310   BUN 55 (H) 09/20/2018 1310   CREATININE 7.82 (H) 09/20/2018 1310   CALCIUM 9.5 09/20/2018 1310   GFRNONAA 4 (L) 09/20/2018 1310   GFRAA 5 (L) 09/20/2018 1310    COAGS: Lab Results  Component Value Date   INR 1.00 08/20/2017   INR 1.23 08/16/2017     Non-Invasive Vascular Imaging:     None   ASSESSMENT/PLAN: This is a 77 y.o. female reportedly with bleeding from her right upper extremity graft after thrombectomy and revision earlier this week by Dr. Trula Slade during her dialysis session earlier today.  On my evaluation all the incisions appear hemostatic.  She has a fair amount of ecchymosis to her right upper extremity where the new interposition graft was tunneled laterally but this is an expected finding.  I see no overt  evidence of hematoma or pseudoaneurysm.  Given the difficulty with accessing her graft and the reported bleeding, I have  recommended placing a tunneled dialysis catheter and resting her graft (which has a nice thrill).  After discussing with nephrology we can plan to do this tomorrow morning since she has no urgent needs for dialysis tonight.  Please have her n.p.o. after midnight.  [facility] tomorrow.   Marty Heck, MD [facility] Office: [phone] Pager: [place]

## 2018-09-20 NOTE — ED Provider Notes (Signed)
[facility] EMERGENCY DEPARTMENT Provider Note   CSN: [id number] Arrival date & time: 09/20/18  1221     History   Chief Complaint No chief complaint on file.   HPI Latoya Lopez is a 77 y.o. female.  The history is provided by the patient and medical records. No language interpreter was used.     77 year old female with history of end-stage renal disease currently on Tuesday/Thursday/Saturday dialysis sent here from the dialysis center due to having a bleeding shunt.  Patient report her right AV fistula was clotted requiring surgical repair on 09/17/2018 by Dr. Russella Dar.  States that the fistula had a revision in order to continue to use.  She went to her normal dialysis appointment today and upon accessing the fistula, it begins to bleed at the surgical site.  EMS was contacted by the time EMS arrived, bleeding has since stopped.  Pressure dressing was placed and patient brought here for further care.  Patient denies having any significant pain, no complaints of lightheadedness or dizziness no chest pain shortness of breath.  She did not receive her dialysis today.  She denies any fever or chills.  Past Medical History:  Diagnosis Date  . Anemia of renal disease   . Cancer ([place])    skin  . Chronic kidney disease    Stage IV  . GERD (gastroesophageal reflux disease)   . Hyperkalemia   . Hyperlipidemia   . Hyperphosphatemia   . Hypertension    Essential with goal blood pressure less than 130/80  . Hypokalemia   . Hypoparathyroidism ([place])   . Hypothyroidism   . Membranous glomerulonephritis   . Proteinuria   . S/P partial hysterectomy   . Seasonal allergies   . Secondary hyperparathyroidism, renal [facility])     Patient Active Problem List   Diagnosis Date Noted  . ESRD (end stage renal disease) ([place]) 08/17/2017  . HTN (hypertension), benign 08/17/2017  . Hypothyroidism 08/17/2017  . Acute congestive heart failure ([place])   . Pleural effusion     Past  Surgical History:  Procedure Laterality Date  . ABDOMINAL HYSTERECTOMY     PARTIAL  . AV FISTULA PLACEMENT Right 08/06/2017   Procedure: ARTERIOVENOUS (AV) FISTULA CREATION RIGHT ARM;  Surgeon: Elam Dutch, MD;  Location: [facility] OR;  Service: Vascular;  Laterality: Right;  . AV FISTULA PLACEMENT Right 08/20/2017   Procedure: INSERTION OF ARTERIOVENOUS (AV) GORE-TEX STRETCH GRAFT INTO RIGHT ARM;  Surgeon: Elam Dutch, MD;  Location: [place];  Service: Vascular;  Laterality: Right;  . INSERTION OF DIALYSIS CATHETER N/A 08/17/2017   Procedure: INSERTION OF DIALYSIS CATHETER;  Surgeon: Rosetta Posner, MD;  Location: [facility] OR;  Service: Vascular;  Laterality: N/A;  . REVISION OF ARTERIOVENOUS GORETEX GRAFT Right 07/07/2018   Procedure: REVISION OF ARTERIOVENOUS GORETEX GRAFT;  Surgeon: Elam Dutch, MD;  Location: [place];  Service: Vascular;  Laterality: Right;  . SKIN BIOPSY      SKIN CANCER 6 AREAS  . THROMBECTOMY AND REVISION OF ARTERIOVENTOUS (AV) GORETEX  GRAFT Right 09/17/2018   Procedure: THROMBECTOMY AND REVISION OF ARTERIOVENTOUS (AV) GORETEX  GRAFT;  Surgeon: Serafina Mitchell, MD;  Location: [place] OR;  Service: Vascular;  Laterality: Right;  . TUBUALIGATION       OB History   None      Home Medications    Prior to Admission medications   Medication Sig Start Date End Date Taking? Authorizing Provider  acetaminophen (TYLENOL) 325 MG tablet  Take 325 mg by mouth every 6 (six) hours as needed for mild pain.    [provider]  atorvastatin (LIPITOR) 80 MG tablet Take 80 mg by mouth daily with breakfast.     [provider]  calcium acetate (PHOSLO) 667 MG capsule Take 2 capsules (1,334 mg total) by mouth 3 (three) times daily with meals. Patient taking differently: Take 2,001 mg by mouth 2 (two) times daily.  08/21/17   Rosita Fire, MD  carvedilol (COREG) 12.5 MG tablet Take 12.5 mg by mouth 2 (two) times daily. Do not take on dialysis days, Tus/Thurs/Sat     [provider]  cholecalciferol 25 MCG (1000 UT) TABS Take 1,000 Units by mouth daily.    [provider]  levothyroxine (SYNTHROID, LEVOTHROID) 75 MCG tablet Take 75 mcg by mouth daily before breakfast.    [provider]  lidocaine-prilocaine (EMLA) cream Apply 1 application topically See admin instructions. Apply small amount to access site 1 to 2 hours prior to dialysis. 06/30/18   [provider]  loratadine (CLARITIN) 10 MG tablet Take 10 mg by mouth daily with breakfast.     [provider]  Multiple Vitamins-Minerals (ONE-A-DAY 50 PLUS PO) Take 1 tablet by mouth daily.    [provider]  omeprazole (PRILOSEC) 20 MG capsule Take 20 mg by mouth daily before breakfast.     [provider]  oxyCODONE-acetaminophen (PERCOCET) 5-325 MG tablet Take 1 tablet by mouth every 6 (six) hours as needed for up to 15 doses for severe pain. 09/17/18   Dagoberto Ligas, PA-C  raloxifene (EVISTA) 60 MG tablet Take 60 mg by mouth daily with breakfast.     [provider]    Family History Family History  Problem Relation Age of Onset  . Stroke Mother   . Heart attack Father   . Heart attack Sister     Social History Social History   Tobacco Use  . Smoking status: Former Smoker    Types: Cigarettes  . Smokeless tobacco: Never Used  . Tobacco comment: quit smoking cigarettes in 1997  Substance Use Topics  . Alcohol use: No  . Drug use: No     Allergies   Patient has no known allergies.   Review of Systems Review of Systems  All other systems reviewed and are negative.    Physical Exam Updated Vital Signs BP (!) 176/76   Pulse 94   Temp 98.2 F (36.8 C) (Oral)   Resp 13   SpO2 100%   Physical Exam  Constitutional: She appears well-developed and well-nourished. No distress.  HENT:  Head: Atraumatic.  Eyes: Conjunctivae are normal.  Neck: Neck supple.  Cardiovascular: Normal rate and regular rhythm.    Murmur heard. Pulmonary/Chest: Effort normal and breath sounds normal. She has no wheezes. She has no rales.  Abdominal: Soft. There is no tenderness.  Musculoskeletal: She exhibits no edema.  Neurological: She is alert.  Skin: No rash noted.  Right upper arm AV fistula with surrounding skin ecchymosis, sutures are intact, not actively bleeding.  Palpable thrills and bruit appreciated, radial pulse 2+, normal grip strength and brisk cap refill to all fingers. Vascular access still in place.   Psychiatric: She has a normal mood and affect.  Nursing note and vitals reviewed.    ED Treatments / Results  Labs (all labs ordered are listed, but only abnormal results are displayed) Labs Reviewed  CBC WITH DIFFERENTIAL/PLATELET - Abnormal; Notable for the following components:  Result Value   RBC 2.85 (*)    Hemoglobin 8.7 (*)    HCT 30.3 (*)    MCV 106.3 (*)    MCHC 28.7 (*)    RDW 17.2 (*)    Platelets 135 (*)    All other components within normal limits  BASIC METABOLIC PANEL - Abnormal; Notable for the following components:   CO2 21 (*)    BUN 55 (*)    Creatinine, Ser 7.82 (*)    GFR calc non Af Amer 4 (*)    GFR calc Af Amer 5 (*)    All other components within normal limits    EKG None  Radiology No results found.  Procedures Procedures (including critical care time)  Medications Ordered in ED Medications - No data to display   Initial Impression / Assessment and Plan / ED Course  I have reviewed the triage vital signs and the nursing notes.  Pertinent labs & imaging results that were available during my care of the patient were reviewed by me and considered in my medical decision making (see chart for details).     BP (!) 176/76   Pulse 94   Temp 98.2 F (36.8 C) (Oral)   Resp 13   SpO2 100%    Final Clinical Impressions(s) / ED Diagnoses   Final diagnoses:  Dialysis AV fistula malfunction, initial encounter [facility])    ED Discharge Orders     None     12:27 PM This is a dialysis patient with a recent right arm AV graft revision which include thrombectomy and revision of the arteriovenous graft done by Dr. Nechama Guard 3 days ago who is here due to bleeding coming from the surgical site upon accessing of the AV fistula for scheduled dialysis today.  Bleeding has since stopped, palpable thrills noted to her AV fistula and patient is without any chest pain shortness of breath or any other discomfort.  She has not receive her dialysis at this time.  2:24 PM Hemoglobin is 8.7 near baseline.  Normal potassium level, evidence of CKD with BUN 55, creatinine 7.82, near baseline.  Appreciate consultation with not really on-call vascular surgeon, who recommend outpatient follow-up and also encourage presented to utilize access near the old AV Fistula site and avoid new site at the moment as it maturing.    2:44 PM I have consulted nephrologist Dr. Arman Filter who recommend pt to f/u with vascular surgeon on Monday and may get dialysis after wound have been evaluated.  He also recommend keeping the vascular access in place.    3:23 PM Dr. Arman Filter has seen in pt in the ER, he felt vascular surgeon will need to evaluate pt prior to accessing the vascular site.  Pt will need to be admit for observation.  I have repaged vascular surgeon Dr. Carlis Abbott who agrees to see pt.  Will consult medicine for admission. Pt likely need tunnel catheter for dialysis access, anticipate tomorrow.  3:37 PM Appreciate consultation from [facility] Dr. Laren Everts who agrees to see and admit pt for further management.     Domenic Moras, PA-C 09/20/18 1539    Sherwood Gambler, MD 09/21/18 [phone]

## 2018-09-20 NOTE — H&P (Signed)
[facility]                                                                                    Patient Demographics  Latoya Lopez, is a 77 y.o. female  CSN: [id number]  MRN: [id number]  DOB - [birth year]  Admit Date - 09/20/2018  Outpatient Primary MD for the patient is Kiefer   With History of -  Past Medical History:  Diagnosis Date  . Anemia of renal disease   . Cancer ([place])    skin  . Chronic kidney disease    Stage IV  . GERD (gastroesophageal reflux disease)   . Hyperkalemia   . Hyperlipidemia   . Hyperphosphatemia   . Hypertension    Essential with goal blood pressure less than 130/80  . Hypokalemia   . Hypoparathyroidism ([place])   . Hypothyroidism   . Membranous glomerulonephritis   . Proteinuria   . S/P partial hysterectomy   . Seasonal allergies   . Secondary hyperparathyroidism, renal [facility])       Past Surgical History:  Procedure Laterality Date  . ABDOMINAL HYSTERECTOMY     PARTIAL  . AV FISTULA PLACEMENT Right 08/06/2017   Procedure: ARTERIOVENOUS (AV) FISTULA CREATION RIGHT ARM;  Surgeon: Elam Dutch, MD;  Location: [facility] OR;  Service: Vascular;  Laterality: Right;  . AV FISTULA PLACEMENT Right 08/20/2017   Procedure: INSERTION OF ARTERIOVENOUS (AV) GORE-TEX STRETCH GRAFT INTO RIGHT ARM;  Surgeon: Elam Dutch, MD;  Location: [place];  Service: Vascular;  Laterality: Right;  . INSERTION OF DIALYSIS CATHETER N/A 08/17/2017   Procedure: INSERTION OF DIALYSIS CATHETER;  Surgeon: Rosetta Posner, MD;  Location: [facility] OR;  Service: Vascular;  Laterality: N/A;  . REVISION OF ARTERIOVENOUS GORETEX GRAFT Right 07/07/2018   Procedure: REVISION OF ARTERIOVENOUS GORETEX GRAFT;  Surgeon: Elam Dutch, MD;  Location: [place];  Service: Vascular;  Laterality: Right;  . SKIN BIOPSY      SKIN CANCER 6 AREAS  . THROMBECTOMY AND REVISION OF ARTERIOVENTOUS (AV) GORETEX  GRAFT Right 09/17/2018   Procedure: THROMBECTOMY AND  REVISION OF ARTERIOVENTOUS (AV) GORETEX  GRAFT;  Surgeon: Serafina Mitchell, MD;  Location: [place];  Service: Vascular;  Laterality: Right;  . TUBUALIGATION      in for   AV fistula bleeding  HPI  Latoya Lopez  is a 77 y.o. female, with past medical history significant for end-stage renal disease on hemodialysis status post revision of dialysis fistula on the right hand on Wednesday who was sent from her dialysis unit today due to incomplete dialysis and bleeding from her right AV fistula.  Nephrology was consulted and asked for the patient to be admitted for dialysis access and management of her right fistula bleeding.  Patient has no complaints.  Denies any chest pains shortness of breath nausea vomiting or diarrhea.  Her lab work was fair.    Review of Systems    In addition to the HPI above,  No Fever-chills, No Headache, No changes with Vision or hearing, No problems swallowing food or Liquids, No Chest pain, Cough or Shortness of Breath, No Abdominal pain, No  Nausea or Vommitting, Bowel movements are regular, No Blood in stool or Urine, No dysuria, No new joints pains-aches,  No new weakness, tingling, numbness in any extremity, No recent weight gain or loss, No polyuria, polydypsia or polyphagia, No significant Mental Stressors.  A full 10 point Review of Systems was done, except as stated above, all other Review of Systems were negative.   Social History Social History   Tobacco Use  . Smoking status: Former Smoker    Types: Cigarettes  . Smokeless tobacco: Never Used  . Tobacco comment: quit smoking cigarettes in 1997  Substance Use Topics  . Alcohol use: No     Family History Family History  Problem Relation Age of Onset  . Stroke Mother   . Heart attack Father   . Heart attack Sister      Prior to Admission medications   Medication Sig Start Date End Date Taking? Authorizing Provider  acetaminophen (TYLENOL) 325 MG tablet Take 325 mg by mouth every  6 (six) hours as needed for mild pain.   Yes [provider]  atorvastatin (LIPITOR) 80 MG tablet Take 80 mg by mouth daily with breakfast.    Yes [provider]  calcium acetate (PHOSLO) 667 MG capsule Take 2 capsules (1,334 mg total) by mouth 3 (three) times daily with meals. Patient taking differently: Take 2,001 mg by mouth 2 (two) times daily.  08/21/17  Yes Rosita Fire, MD  carvedilol (COREG) 12.5 MG tablet Take 12.5 mg by mouth 2 (two) times daily. Do not take on dialysis days, Tus/Thurs/Sat   Yes [provider]  levothyroxine (SYNTHROID, LEVOTHROID) 75 MCG tablet Take 75 mcg by mouth daily before breakfast.   Yes [provider]  lidocaine-prilocaine (EMLA) cream Apply 1 application topically See admin instructions. Apply small amount to access site 1 to 2 hours prior to dialysis. 06/30/18  Yes [provider]  loratadine (CLARITIN) 10 MG tablet Take 10 mg by mouth daily with breakfast.    Yes [provider]  Multiple Vitamins-Minerals (ONE-A-DAY 50 PLUS PO) Take 1 tablet by mouth daily.   Yes [provider]  omeprazole (PRILOSEC) 20 MG capsule Take 20 mg by mouth daily before breakfast.    Yes [provider]  raloxifene (EVISTA) 60 MG tablet Take 60 mg by mouth daily with breakfast.    Yes [provider]  oxyCODONE-acetaminophen (PERCOCET) 5-325 MG tablet Take 1 tablet by mouth every 6 (six) hours as needed for up to 15 doses for severe pain. 09/17/18   Dagoberto Ligas, PA-C    No Known Allergies  Physical Exam  Vitals  Blood pressure 112/70, pulse 65, temperature 98.2 F (36.8 C), temperature source Oral, resp. rate 13, SpO2 98 %.   1. General extremely pleasant, no acute distress  2. Normal affect and insight, Not Suicidal or Homicidal, Awake Alert, Oriented X 3.  3. No F.N deficits, patient moving all extremities.  4. Ears and Eyes appear Normal, Conjunctivae clear, PERRLA. Moist  Oral Mucosa.  5. Supple Neck, No JVD, No cervical lymphadenopathy appriciated, No Carotid Bruits.  6. Symmetrical Chest wall movement, Good air movement bilaterally, CTAB.  7. RRR, systolic murmur noted.  8. Positive Bowel Sounds, Abdomen Soft, Non tender, No organomegaly appriciated,No rebound -guarding or rigidity.  9.  No Cyanosis, Normal Skin Turgor, No Skin Rash or Bruise.  10. Good muscle tone, right arm fistula swollen with bruising.    Data Review  CBC Recent Labs  Lab 09/17/18  1157 09/17/18 1402 09/20/18 1310  WBC 5.9  --  4.9  HGB 10.8* 9.5* 8.7*  HCT 36.1 28.0* 30.3*  PLT 156  --  135*  MCV 100.8*  --  106.3*  MCH 30.2  --  30.5  MCHC 29.9*  --  28.7*  RDW 17.4*  --  17.2*  LYMPHSABS 1.5  --  1.3  MONOABS 0.5  --  0.5  EOSABS 0.5  --  0.5  BASOSABS 0.0  --  0.1   ------------------------------------------------------------------------------------------------------------------  Chemistries  Recent Labs  Lab 09/17/18 1402 09/20/18 1310  NA 142 140  K 4.4 4.9  CL  --  109  CO2  --  21*  GLUCOSE 86 96  BUN  --  55*  CREATININE  --  7.82*  CALCIUM  --  9.5   ------------------------------------------------------------------------------------------------------------------ estimated creatinine clearance is 6.5 mL/min (A) (by C-G formula based on SCr of 7.82 mg/dL (H)). ------------------------------------------------------------------------------------------------------------------ No results for input(s): TSH, T4TOTAL, T3FREE, THYROIDAB in the last 72 hours.  Invalid input(s): FREET3   Coagulation profile No results for input(s): INR, PROTIME in the last 168 hours. ------------------------------------------------------------------------------------------------------------------- No results for input(s): DDIMER in the last 72  hours. -------------------------------------------------------------------------------------------------------------------  Cardiac Enzymes No results for input(s): CKMB, TROPONINI, MYOGLOBIN in the last 168 hours.  Invalid input(s): CK ------------------------------------------------------------------------------------------------------------------ Invalid input(s): POCBNP   ---------------------------------------------------------------------------------------------------------------  Urinalysis No results found for: COLORURINE, APPEARANCEUR, LABSPEC, [place], GLUCOSEU, HGBUR, BILIRUBINUR, KETONESUR, PROTEINUR, UROBILINOGEN, NITRITE, LEUKOCYTESUR  ----------------------------------------------------------------------------------------------------------------   Imaging results:   No results found.  My personal review of EKG: Rhythm NSR, 96 bpm with left atrial enlargement and incomplete right bundle branch block    Assessment & Plan  Right arteriovenous brachiocephalic fistula bleeding Vascular surgery consult pending for dialysis access and bleeding management  History of end-stage renal disease on hemodialysis Tuesday Thursday and Saturday Will admit for dialysis after access is placed  Hyperlipidemia; continue with Lipitor  Hypertension continue with Coreg  Hypothyroidism continue with Synthroid  Anemia of chronic disease  to end-stage renal disease  DVT Prophylaxis SCDs  AM Labs Ordered, also please review Full Orders  Family Communication: Admission, patients condition and plan of care including tests being ordered have been discussed with the patient and daughter who indicate understanding and agree with the plan and Code Status.  Code Status full  Disposition Plan: Home  Time spent in minutes : 36 minutes  Condition GUARDED   @SIGNATURE @

## 2018-09-20 NOTE — Anesthesia Preprocedure Evaluation (Addendum)
Anesthesia Evaluation  Patient identified by MRN, date of birth, ID band Patient awake    Reviewed: Allergy & Precautions, NPO status , Patient's Chart, lab work & pertinent test results, reviewed documented beta blocker date and time   Airway Mallampati: II  TM Distance: >3 FB Neck ROM: Full    Dental  (+) Dental Advisory Given, Edentulous Lower, Edentulous Upper   Pulmonary former smoker,    Pulmonary exam normal breath sounds clear to auscultation       Cardiovascular hypertension, Pt. on home beta blockers and Pt. on medications +CHF  Normal cardiovascular exam Rhythm:Regular Rate:Normal     Neuro/Psych negative neurological ROS  negative psych ROS   GI/Hepatic Neg liver ROS, GERD  Medicated and Controlled,  Endo/Other  Hypothyroidism   Renal/GU ESRF and DialysisRenal diseaseK+ 4.9; last dialysis Thursday      Musculoskeletal negative musculoskeletal ROS (+)   Abdominal   Peds  Hematology  (+) Blood dyscrasia (Thrombocytopenia), anemia ,   Anesthesia Other Findings Day of surgery medications reviewed with the patient.  Reproductive/Obstetrics                            Anesthesia Physical Anesthesia Plan  ASA: III  Anesthesia Plan: General   Post-op Pain Management:    Induction: Intravenous  PONV Risk Score and Plan: 3 and Treatment may vary due to age or medical condition, Ondansetron and Dexamethasone  Airway Management Planned: LMA  Additional Equipment:   Intra-op Plan:   Post-operative Plan: Extubation in OR  Informed Consent: I have reviewed the patients History and Physical, chart, labs and discussed the procedure including the risks, benefits and alternatives for the proposed anesthesia with the patient or authorized representative who has indicated his/her understanding and acceptance.   Dental advisory given  Plan Discussed with: CRNA  Anesthesia Plan  Comments: (DNR-patient wishes to suspend in perioperative period. )        Anesthesia Quick Evaluation

## 2018-09-20 NOTE — ED Notes (Signed)
Per Velva Harman, NP for renal and Dr. Ebony Hail- pt is NOT to have dialysis graft de-accessed until pt is seen by Vascular surgeon.

## 2018-09-20 NOTE — Plan of Care (Signed)
  Problem: Health Behavior/Discharge Planning: Goal: Ability to manage health-related needs will improve Outcome: Progressing   Problem: Clinical Measurements: Goal: Ability to maintain clinical measurements within normal limits will improve Outcome: Progressing Goal: Will remain free from infection Outcome: Progressing Goal: Cardiovascular complication will be avoided Outcome: Progressing   

## 2018-09-20 NOTE — Consult Note (Signed)
[facility] Renal Consultation Note    Indication for Consultation:  Management of ESRD/hemodialysis; anemia, hypertension/volume and secondary hyperparathyroidism PCP:  HPI: Latoya Lopez is a 77 y.o. female with ESRD on hemodialysis T,Th,S at [facility]. PMH: Membranous glomerulonephritis, HTN. HLD, hypothyroidism, GERD, SHPT, AOCD. Patient is compliant with HD, does not miss treatments.   Patient had thrombectomy/revision of Gore-Tex graft by placement of new interposition 6 mm Gore-Tex graft 09/17/18. Patient had HD 09/18/18 without issues. She presented to HD unit this AM for regular treatment. When nurses attempted to venous portion of AVG, bottom portion of graft started to bleed profusely. Hemostasis obtained after holding direct pressure, EBL 50cc. She was sent to [facility] for evaluation of bleeding AVG.   Currently seen in ED, AVG still cannulated. AVG is bruised and swollen with positive bruit. She has no other complaints at present.   Past Medical History:  Diagnosis Date  . Anemia of renal disease   . Cancer ([place])    skin  . Chronic kidney disease    Stage IV  . GERD (gastroesophageal reflux disease)   . Hyperkalemia   . Hyperlipidemia   . Hyperphosphatemia   . Hypertension    Essential with goal blood pressure less than 130/80  . Hypokalemia   . Hypoparathyroidism ([place])   . Hypothyroidism   . Membranous glomerulonephritis   . Proteinuria   . S/P partial hysterectomy   . Seasonal allergies   . Secondary hyperparathyroidism, renal [facility])    Past Surgical History:  Procedure Laterality Date  . ABDOMINAL HYSTERECTOMY     PARTIAL  . AV FISTULA PLACEMENT Right 08/06/2017   Procedure: ARTERIOVENOUS (AV) FISTULA CREATION RIGHT ARM;  Surgeon: Elam Dutch, MD;  Location: [facility] OR;  Service: Vascular;  Laterality: Right;  . AV FISTULA PLACEMENT Right 08/20/2017   Procedure: INSERTION OF ARTERIOVENOUS (AV) GORE-TEX STRETCH GRAFT INTO RIGHT ARM;   Surgeon: Elam Dutch, MD;  Location: [place];  Service: Vascular;  Laterality: Right;  . INSERTION OF DIALYSIS CATHETER N/A 08/17/2017   Procedure: INSERTION OF DIALYSIS CATHETER;  Surgeon: Rosetta Posner, MD;  Location: [facility] OR;  Service: Vascular;  Laterality: N/A;  . REVISION OF ARTERIOVENOUS GORETEX GRAFT Right 07/07/2018   Procedure: REVISION OF ARTERIOVENOUS GORETEX GRAFT;  Surgeon: Elam Dutch, MD;  Location: [place];  Service: Vascular;  Laterality: Right;  . SKIN BIOPSY      SKIN CANCER 6 AREAS  . THROMBECTOMY AND REVISION OF ARTERIOVENTOUS (AV) GORETEX  GRAFT Right 09/17/2018   Procedure: THROMBECTOMY AND REVISION OF ARTERIOVENTOUS (AV) GORETEX  GRAFT;  Surgeon: Serafina Mitchell, MD;  Location: [place] OR;  Service: Vascular;  Laterality: Right;  . TUBUALIGATION     Family History  Problem Relation Age of Onset  . Stroke Mother   . Heart attack Father   . Heart attack Sister    Social History:  reports that she has quit smoking. Her smoking use included cigarettes. She has never used smokeless tobacco. She reports that she does not drink alcohol or use drugs. No Known Allergies Prior to Admission medications   Medication Sig Start Date End Date Taking? Authorizing Provider  acetaminophen (TYLENOL) 325 MG tablet Take 325 mg by mouth every 6 (six) hours as needed for mild pain.   Yes [provider]  atorvastatin (LIPITOR) 80 MG tablet Take 80 mg by mouth daily with breakfast.    Yes [provider]  calcium acetate (PHOSLO) 667 MG capsule Take  2 capsules (1,334 mg total) by mouth 3 (three) times daily with meals. Patient taking differently: Take 2,001 mg by mouth 2 (two) times daily.  08/21/17  Yes Rosita Fire, MD  carvedilol (COREG) 12.5 MG tablet Take 12.5 mg by mouth 2 (two) times daily. Do not take on dialysis days, Tus/Thurs/Sat   Yes [provider]  levothyroxine (SYNTHROID, LEVOTHROID) 75 MCG tablet Take 75 mcg by mouth daily before  breakfast.   Yes [provider]  lidocaine-prilocaine (EMLA) cream Apply 1 application topically See admin instructions. Apply small amount to access site 1 to 2 hours prior to dialysis. 06/30/18  Yes [provider]  loratadine (CLARITIN) 10 MG tablet Take 10 mg by mouth daily with breakfast.    Yes [provider]  Multiple Vitamins-Minerals (ONE-A-DAY 50 PLUS PO) Take 1 tablet by mouth daily.   Yes [provider]  omeprazole (PRILOSEC) 20 MG capsule Take 20 mg by mouth daily before breakfast.    Yes [provider]  raloxifene (EVISTA) 60 MG tablet Take 60 mg by mouth daily with breakfast.    Yes [provider]  oxyCODONE-acetaminophen (PERCOCET) 5-325 MG tablet Take 1 tablet by mouth every 6 (six) hours as needed for up to 15 doses for severe pain. 09/17/18   Dagoberto Ligas, PA-C   No current facility-administered medications for this encounter.    Current Outpatient Medications  Medication Sig Dispense Refill  . acetaminophen (TYLENOL) 325 MG tablet Take 325 mg by mouth every 6 (six) hours as needed for mild pain.    Marland Kitchen atorvastatin (LIPITOR) 80 MG tablet Take 80 mg by mouth daily with breakfast.     . calcium acetate (PHOSLO) 667 MG capsule Take 2 capsules (1,334 mg total) by mouth 3 (three) times daily with meals. (Patient taking differently: Take 2,001 mg by mouth 2 (two) times daily. ) 90 capsule 0  . carvedilol (COREG) 12.5 MG tablet Take 12.5 mg by mouth 2 (two) times daily. Do not take on dialysis days, Tus/Thurs/Sat    . levothyroxine (SYNTHROID, LEVOTHROID) 75 MCG tablet Take 75 mcg by mouth daily before breakfast.    . lidocaine-prilocaine (EMLA) cream Apply 1 application topically See admin instructions. Apply small amount to access site 1 to 2 hours prior to dialysis.  12  . loratadine (CLARITIN) 10 MG tablet Take 10 mg by mouth daily with breakfast.     . Multiple Vitamins-Minerals (ONE-A-DAY 50 PLUS PO) Take 1 tablet by  mouth daily.    Marland Kitchen omeprazole (PRILOSEC) 20 MG capsule Take 20 mg by mouth daily before breakfast.     . raloxifene (EVISTA) 60 MG tablet Take 60 mg by mouth daily with breakfast.     . oxyCODONE-acetaminophen (PERCOCET) 5-325 MG tablet Take 1 tablet by mouth every 6 (six) hours as needed for up to 15 doses for severe pain. 15 tablet 0   Labs: Basic Metabolic Panel: Recent Labs  Lab 09/17/18 1402 09/20/18 1310  NA 142 140  K 4.4 4.9  CL  --  109  CO2  --  21*  GLUCOSE 86 96  BUN  --  55*  CREATININE  --  7.82*  CALCIUM  --  9.5   Liver Function Tests: No results for input(s): AST, ALT, ALKPHOS, BILITOT, PROT, ALBUMIN in the last 168 hours. No results for input(s): LIPASE, AMYLASE in the last 168 hours. No results for input(s): AMMONIA in the last 168 hours. CBC: Recent Labs  Lab 09/17/18 1157 09/17/18  1402 09/20/18 1310  WBC 5.9  --  4.9  NEUTROABS 3.4  --  2.6  HGB 10.8* 9.5* 8.7*  HCT 36.1 28.0* 30.3*  MCV 100.8*  --  106.3*  PLT 156  --  135*   Cardiac Enzymes: No results for input(s): CKTOTAL, CKMB, CKMBINDEX, TROPONINI in the last 168 hours. CBG: No results for input(s): GLUCAP in the last 168 hours. Iron Studies: No results for input(s): IRON, TIBC, TRANSFERRIN, FERRITIN in the last 72 hours. Studies/Results: No results found.  ROS: As per HPI otherwise negative.   Physical Exam: Vitals:   09/20/18 1445 09/20/18 1500 09/20/18 1515 09/20/18 1530  BP: 111/72 112/74  112/70  Pulse: 68 63 65 65  Resp: 13 12 14 13   Temp:      TempSrc:      SpO2: 98% 98% 98% 98%     General: Pleasant elderly female in no acute distress. Head: Normocephalic, atraumatic, sclera non-icteric, mucus membranes are moist Neck: Supple. JVD not elevated. Lungs: Clear bilaterally to auscultation without wheezes, rales, or rhonchi. Breathing is unlabored. Heart: RRR with S1 S2. No murmurs, rubs, or gallops appreciated. Abdomen: Soft, non-tender, non-distended with normoactive bowel  sounds. No rebound/guarding. No obvious abdominal masses. M-S:  Strength and tone appear normal for age. Lower extremities:without edema or ischemic changes, no open wounds  Neuro: Alert and oriented X 3. Moves all extremities spontaneously. Psych:  Responds to questions appropriately with a normal affect. Dialysis Access: RUA AVG still cannulated with ecchymosis/swelling present. + bruit  Dialysis Orders:  T,Th,S 3 hr 45 min 180NRe 250/800 65.5 kg 2.0 K/ 2.25 Ca UFP 2 RUA AVG using 17g needles post revision -Heparin 1800 units IV TIW -Mircera 200 mcg IV q 2 weeks (last dose 09/09/18)  Assessment/Plan: 1.  Bleeding from revision site of AVG. VVS consulted. Will need TDC placed for access. Per primary.  2.  ESRD -  T,Th,S No urgent dialysis needs today. Follow labs, HD tomorrow or Monday after [facility] placed.  3.  Hypertension/volume  - BP well controlled. No evidence of volume overload by exam.  4.  Anemia  - HGB 8.7. Last HGB at OP center 9.5 09/18/18. EBL from AVG bleeding approx 50 cc. Recent ESA dose. Follow HGB.   5.  Metabolic bone disease - Ca 9.5 Continue binders, no VDRA 6.  Nutrition - NPO pending vascular consult.   Coretha Creswell H. Owens Shark, NP-C 09/20/2018, 4:18 PM  [employer] [phone]

## 2018-09-20 NOTE — ED Triage Notes (Signed)
Patient arrived via [facility] from a dialysis center in [place], related to a bleeding shunt. Dialysis was not done today(she is a T,TH, S dialysis). Patient reports that she has a revision of graft on Wednesday.  On arrival, patient's shunt is not bleeding. Patient is A&O X 4.  EDP at bedside

## 2018-09-20 NOTE — ED Notes (Signed)
REPAGED VASCULAR SURGERY

## 2018-09-20 NOTE — ED Notes (Signed)
Vascular surgery at the bedside

## 2018-09-21 ENCOUNTER — Encounter (HOSPITAL_COMMUNITY)
Admission: EM | Disposition: A | Discharge status: Home / Self Care | Payer: Self-pay | Source: Home / Self Care | Attending: Internal Medicine

## 2018-09-21 ENCOUNTER — Inpatient Hospital Stay (HOSPITAL_COMMUNITY): Payer: Medicare Other | Admitting: Anesthesiology

## 2018-09-21 ENCOUNTER — Inpatient Hospital Stay (HOSPITAL_COMMUNITY): Payer: Medicare Other

## 2018-09-21 DIAGNOSIS — N186 End stage renal disease: Secondary | ICD-10-CM

## 2018-09-21 HISTORY — PX: INSERTION OF DIALYSIS CATHETER: SHX1324

## 2018-09-21 LAB — RENAL FUNCTION PANEL
Albumin: 2.7 g/dL — ABNORMAL LOW (ref 3.5–5.0)
Anion gap: 8 (ref 5–15)
BUN: 66 mg/dL — ABNORMAL HIGH (ref 8–23)
CO2: 21 mmol/L — ABNORMAL LOW (ref 22–32)
Calcium: 9.1 mg/dL (ref 8.9–10.3)
Chloride: 109 mmol/L (ref 98–111)
Creatinine, Ser: 8.15 mg/dL — ABNORMAL HIGH (ref 0.44–1.00)
GFR calc Af Amer: 5 mL/min — ABNORMAL LOW (ref >60)
GFR calc non Af Amer: 4 mL/min — ABNORMAL LOW (ref >60)
Glucose, Bld: 106 mg/dL — ABNORMAL HIGH (ref 70–99)
Phosphorus: 4.3 mg/dL (ref 2.5–4.6)
Potassium: 4.9 mmol/L (ref 3.5–5.1)
Sodium: 138 mmol/L (ref 135–145)

## 2018-09-21 LAB — CBC
HCT: 26.6 % — ABNORMAL LOW (ref 36.0–46.0)
Hemoglobin: 7.9 g/dL — ABNORMAL LOW (ref 12.0–15.0)
MCH: 30.6 pg (ref 26.0–34.0)
MCHC: 29.7 g/dL — ABNORMAL LOW (ref 30.0–36.0)
MCV: 103.1 fL — ABNORMAL HIGH (ref 80.0–100.0)
Platelets: 131 K/uL — ABNORMAL LOW (ref 150–400)
RBC: 2.58 MIL/uL — ABNORMAL LOW (ref 3.87–5.11)
RDW: 17 % — ABNORMAL HIGH (ref 11.5–15.5)
WBC: 5 10*3/uL (ref 4.0–10.5)
nRBC: 0 % (ref 0.0–0.2)

## 2018-09-21 LAB — SURGICAL PCR SCREEN
MRSA, PCR: NEGATIVE
STAPHYLOCOCCUS AUREUS: NEGATIVE

## 2018-09-21 SURGERY — INSERTION OF DIALYSIS CATHETER
Anesthesia: General | Laterality: Right

## 2018-09-21 MED ORDER — ONDANSETRON HCL 4 MG/2ML IJ SOLN
INTRAMUSCULAR | Status: AC
  Filled 2018-09-21: qty 2

## 2018-09-21 MED ORDER — MUPIROCIN 2 % EX OINT: 1.0000 "application " | TOPICAL_OINTMENT | Freq: Two times a day (BID) | CUTANEOUS | Status: DC

## 2018-09-21 MED ORDER — LIDOCAINE-EPINEPHRINE (PF) 1 %-1:200000 IJ SOLN
INTRAMUSCULAR | Status: AC
  Filled 2018-09-21: qty 30

## 2018-09-21 MED ORDER — ONDANSETRON HCL 4 MG/2ML IJ SOLN
INTRAMUSCULAR | Status: DC | PRN
  Administered 2018-09-21: 4 mg via INTRAVENOUS

## 2018-09-21 MED ORDER — FENTANYL CITRATE (PF) 250 MCG/5ML IJ SOLN
INTRAMUSCULAR | Status: DC | PRN
  Administered 2018-09-21: 25 ug via INTRAVENOUS

## 2018-09-21 MED ORDER — DEXAMETHASONE SODIUM PHOSPHATE 10 MG/ML IJ SOLN
INTRAMUSCULAR | Status: AC
  Filled 2018-09-21: qty 1

## 2018-09-21 MED ORDER — VASOPRESSIN 20 UNIT/ML IV SOLN
INTRAVENOUS | Status: AC
  Filled 2018-09-21: qty 1

## 2018-09-21 MED ORDER — CEFAZOLIN SODIUM-DEXTROSE 1-4 GM/50ML-% IV SOLN
INTRAVENOUS | Status: DC | PRN
  Administered 2018-09-21: 1 g via INTRAVENOUS

## 2018-09-21 MED ORDER — FENTANYL CITRATE (PF) 100 MCG/2ML IJ SOLN: 25.0000 ug | INTRAMUSCULAR | Status: DC | PRN

## 2018-09-21 MED ORDER — DEXAMETHASONE SODIUM PHOSPHATE 10 MG/ML IJ SOLN
INTRAMUSCULAR | Status: DC | PRN
  Administered 2018-09-21: 10 mg via INTRAVENOUS

## 2018-09-21 MED ORDER — PROPOFOL 10 MG/ML IV BOLUS
INTRAVENOUS | Status: AC
  Filled 2018-09-21: qty 20

## 2018-09-21 MED ORDER — SODIUM CHLORIDE 0.9 % IV SOLN
INTRAVENOUS | Status: AC
  Filled 2018-09-21: qty 1.2

## 2018-09-21 MED ORDER — CHLORHEXIDINE GLUCONATE CLOTH 2 % EX PADS
6.0000 | MEDICATED_PAD | Freq: Every day | CUTANEOUS | Status: DC
  Administered 2018-09-22: 6 via TOPICAL

## 2018-09-21 MED ORDER — LIDOCAINE 2% (20 MG/ML) 5 ML SYRINGE
INTRAMUSCULAR | Status: AC
  Filled 2018-09-21: qty 5

## 2018-09-21 MED ORDER — HEPARIN SODIUM (PORCINE) 1000 UNIT/ML IJ SOLN
INTRAMUSCULAR | Status: DC | PRN
  Administered 2018-09-21: 3400 [IU] via INTRAVENOUS

## 2018-09-21 MED ORDER — PROPOFOL 10 MG/ML IV BOLUS
INTRAVENOUS | Status: DC | PRN
  Administered 2018-09-21: 80 mg via INTRAVENOUS

## 2018-09-21 MED ORDER — SODIUM CHLORIDE 0.9 % IV SOLN
INTRAVENOUS | Status: DC | PRN
  Administered 2018-09-21: 500 mL

## 2018-09-21 MED ORDER — ONDANSETRON HCL 4 MG/2ML IJ SOLN: 4.0000 mg | Freq: Once | INTRAMUSCULAR | Status: DC | PRN

## 2018-09-21 MED ORDER — FENTANYL CITRATE (PF) 250 MCG/5ML IJ SOLN
INTRAMUSCULAR | Status: AC
  Filled 2018-09-21: qty 5

## 2018-09-21 MED ORDER — HEPARIN SODIUM (PORCINE) 1000 UNIT/ML IJ SOLN
INTRAMUSCULAR | Status: AC
  Filled 2018-09-21: qty 1

## 2018-09-21 MED ORDER — 0.9 % SODIUM CHLORIDE (POUR BTL) OPTIME
TOPICAL | Status: DC | PRN
  Administered 2018-09-21: 1000 mL

## 2018-09-21 MED ORDER — LIDOCAINE HCL (CARDIAC) PF 100 MG/5ML IV SOSY
PREFILLED_SYRINGE | INTRAVENOUS | Status: DC | PRN
  Administered 2018-09-21: 60 mg via INTRAVENOUS

## 2018-09-21 MED ORDER — DARBEPOETIN ALFA 200 MCG/0.4ML IJ SOSY
200.0000 ug | PREFILLED_SYRINGE | INTRAMUSCULAR | Status: DC
  Administered 2018-09-22: 200 ug via INTRAVENOUS

## 2018-09-21 SURGICAL SUPPLY — 46 items
ADH SKN CLS APL DERMABOND .7 (GAUZE/BANDAGES/DRESSINGS) ×1
BAG DECANTER FOR FLEXI CONT (MISCELLANEOUS) ×3 IMPLANT
BIOPATCH RED 1 DISK 7.0 (GAUZE/BANDAGES/DRESSINGS) ×2 IMPLANT
BIOPATCH RED 1IN DISK 7.0MM (GAUZE/BANDAGES/DRESSINGS) ×1
CATH PALINDROME RT-P 15FX19CM (CATHETERS) IMPLANT
CATH PALINDROME RT-P 15FX23CM (CATHETERS) ×2 IMPLANT
CATH PALINDROME RT-P 15FX28CM (CATHETERS) IMPLANT
CATH PALINDROME RT-P 15FX55CM (CATHETERS) IMPLANT
CATH STRAIGHT 5FR 65CM (CATHETERS) IMPLANT
COVER PROBE W GEL 5X96 (DRAPES) ×3 IMPLANT
COVER SURGICAL LIGHT HANDLE (MISCELLANEOUS) ×3 IMPLANT
COVER WAND RF STERILE (DRAPES) ×1 IMPLANT
DECANTER SPIKE VIAL GLASS SM (MISCELLANEOUS) ×1 IMPLANT
DERMABOND ADVANCED (GAUZE/BANDAGES/DRESSINGS) ×2
DERMABOND ADVANCED .7 DNX12 (GAUZE/BANDAGES/DRESSINGS) ×1 IMPLANT
DRAPE C-ARM 42X72 X-RAY (DRAPES) ×3 IMPLANT
DRAPE CHEST BREAST 15X10 FENES (DRAPES) ×3 IMPLANT
GAUZE 4X4 16PLY RFD (DISPOSABLE) ×3 IMPLANT
GLOVE BIO SURGEON STRL SZ7.5 (GLOVE) ×3 IMPLANT
GLOVE BIOGEL PI IND STRL 8 (GLOVE) ×1 IMPLANT
GLOVE BIOGEL PI INDICATOR 8 (GLOVE) ×2
GOWN STRL REUS W/ TWL LRG LVL3 (GOWN DISPOSABLE) ×2 IMPLANT
GOWN STRL REUS W/ TWL XL LVL3 (GOWN DISPOSABLE) ×2 IMPLANT
GOWN STRL REUS W/TWL LRG LVL3 (GOWN DISPOSABLE) ×6
GOWN STRL REUS W/TWL XL LVL3 (GOWN DISPOSABLE) ×3
KIT BASIN OR (CUSTOM PROCEDURE TRAY) ×3 IMPLANT
KIT TURNOVER KIT B (KITS) ×3 IMPLANT
NDL 18GX1X1/2 (RX/OR ONLY) (NEEDLE) ×1 IMPLANT
NDL HYPO 25GX1X1/2 BEV (NEEDLE) ×1 IMPLANT
NEEDLE 18GX1X1/2 (RX/OR ONLY) (NEEDLE) ×3 IMPLANT
NEEDLE HYPO 25GX1X1/2 BEV (NEEDLE) ×3 IMPLANT
NS IRRIG 1000ML POUR BTL (IV SOLUTION) ×3 IMPLANT
PACK SURGICAL SETUP 50X90 (CUSTOM PROCEDURE TRAY) ×3 IMPLANT
PAD ARMBOARD 7.5X6 YLW CONV (MISCELLANEOUS) ×6 IMPLANT
SET MICROPUNCTURE 5F STIFF (MISCELLANEOUS) IMPLANT
SOAP 2 % CHG 4 OZ (WOUND CARE) ×3 IMPLANT
SUT ETHILON 3 0 PS 1 (SUTURE) ×3 IMPLANT
SUT MNCRL AB 4-0 PS2 18 (SUTURE) ×3 IMPLANT
SYR 10ML LL (SYRINGE) ×3 IMPLANT
SYR 20CC LL (SYRINGE) ×4 IMPLANT
SYR 5ML LL (SYRINGE) ×3 IMPLANT
SYR CONTROL 10ML LL (SYRINGE) ×1 IMPLANT
TOWEL GREEN STERILE (TOWEL DISPOSABLE) ×3 IMPLANT
TOWEL GREEN STERILE FF (TOWEL DISPOSABLE) ×3 IMPLANT
WATER STERILE IRR 1000ML POUR (IV SOLUTION) ×3 IMPLANT
WIRE AMPLATZ SS-J .035X180CM (WIRE) IMPLANT

## 2018-09-21 NOTE — Progress Notes (Signed)
[facility]  Subjective  - No bleeding from RUE graft revision overnight.  Good thrill.    Objective (!) 153/71 89 97.9 F (36.6 C) (Oral) 18 98%  Intake/Output Summary (Last 24 hours) at 09/21/2018 0708 Last data filed at 09/21/2018 0552 Gross per 24 hour  Intake 240 ml  Output 350 ml  Net -110 ml    NAD, resting Right upper extremity graft good thrill, ecchymosis, no hematoma  Laboratory Lab Results: Recent Labs    09/20/18 1310 09/21/18 0442  WBC 4.9 5.0  HGB 8.7* 7.9*  HCT 30.3* 26.6*  PLT 135* 131*   BMET Recent Labs    09/20/18 1310 09/21/18 0442  NA 140 138  K 4.9 4.9  CL 109 109  CO2 21* 21*  GLUCOSE 96 106*  BUN 55* 66*  CREATININE 7.82* 8.15*  CALCIUM 9.5 9.1    COAG Lab Results  Component Value Date   INR 1.00 08/20/2017   INR 1.23 08/16/2017   No results found for: PTT  Assessment/Planning: Plan for Tunneled Dialysis Catheter today.  Will rest RUE graft revision over next month given bleeding event in dialysis.  Risks and benefits of catheter placement discussed with patient.  Marty Heck 09/21/2018 7:08 AM --  Marty Heck

## 2018-09-21 NOTE — Anesthesia Postprocedure Evaluation (Signed)
Anesthesia Post Note  Patient: Latoya Lopez  Procedure(s) Performed: INSERTION OF DIALYSIS CATHETER (Right )     Patient location during evaluation: PACU Anesthesia Type: General Level of consciousness: awake and alert, awake and oriented Pain management: pain level controlled Vital Signs Assessment: post-procedure vital signs reviewed and stable Respiratory status: spontaneous breathing, nonlabored ventilation and respiratory function stable Cardiovascular status: blood pressure returned to baseline and stable Postop Assessment: no apparent nausea or vomiting Anesthetic complications: no    Last Vitals:  Vitals:   09/21/18 0912 09/21/18 1402  BP: (!) 171/84 (!) 146/70  Pulse: 97 88  Resp: 17 18  Temp: (!) 36.4 C 36.8 C  SpO2: 97% 98%    Last Pain:  Vitals:   09/21/18 1402  TempSrc: Oral  PainSc:                  [name]

## 2018-09-21 NOTE — Op Note (Signed)
OPERATIVE NOTE  PROCEDURE: 1. Right internal jugular vein tunneled dialysis catheter placement 2. Right internal jugular vein cannulation under ultrasound guidance  PRE-OPERATIVE DIAGNOSIS: end-stage renal failure  POST-OPERATIVE DIAGNOSIS: same as above  SURGEON: Marty Heck, MD  ANESTHESIA: MAC  ESTIMATED BLOOD LOSS: 30 cc  FINDING(S): 1.  Tips of the catheter in the right atrium on fluoroscopy 2.  No obvious pneumothorax on fluoroscopy  SPECIMEN(S):  none  INDICATIONS:   Latoya Lopez is a 77 y.o. female who presents with end stage renal disease.  She underwent revision of a right upper arm graft earlier this week and unfortunately dialysis had trouble accessing her graft with bleeding issues.  The patient presents for tunneled dialysis catheter placement today with plans to rest her right upper arm graft.  The patient is aware the risks of tunneled dialysis catheter placement include but are not limited to: bleeding, infection, central venous injury, pneumothorax, possible venous stenosis, possible malpositioning in the venous system, and possible infections related to long-term catheter presence.  The patient was aware of these risks and agreed to proceed.  DESCRIPTION: After written full informed consent was obtained from the patient, the patient was taken back to the operating room.  Prior to induction, the patient was given IV antibiotics.  After obtaining adequate sedation, the patient was prepped and draped in the standard fashion for a chest or neck tunneled dialysis catheter placement.  Under ultrasound guidance, the right internal jugular vein was cannulated with the 18 gauge needle.  A J-wire was then placed down into the right ventricle under fluoroscopic guidance.  The wire was then secured in place with a clamp to the drapes.  I then made stab incisions at the neck and exit sites.   I dissected from the exit site to the cannulation site with a tunneler.    The subcutaneous tunnel was dilated by passing a plastic dilator over the metal dissector. The wire was then unclamped and I removed the needle.  The skin tract and venotomy was dilated serially with dilators.  Finally, the dilator-sheath was placed under fluoroscopic guidance into the superior vena cava.  The dilator and wire were removed.  A 23 cm Diatek catheter was placed under fluoroscopic guidance down into the right atrium.  The sheath was broken and peeled away while holding the catheter cuff at the level of the skin.  The back end of this catheter was transected, and docked onto the tunneler.  The distal catheter was delivered through the subcutaneous tunnel.  The catheter was transected a second time, revealing the two lumens of this catheter.  The ports were docked onto these two lumens.  The catheter collar was then snapped into place.  Each port was tested by aspirating and flushing.  No resistance was noted.  Each port was then thoroughly flushed with heparinized saline.  The catheter was secured in placed with two interrupted stitches of 3-0 Nylon tied to the catheter.  The neck incision was closed with a U-stitch of 4-0 Monocryl.  The neck and chest incision were cleaned and sterile bandages applied.  Each port was then loaded with concentrated heparin (1000 Units/mL) at the manufacturer recommended volumes to each port.  Sterile caps were applied to each port.    On completion fluoroscopy, the tips of the catheter were in the right atrium, and there was no evidence of pneumothorax.  COMPLICATIONS: None  CONDITION: Stable  Marty Heck, MD [facility]  [place] Office: [phone] Pager: [phone]   09/21/2018, 8:06 AM

## 2018-09-21 NOTE — Anesthesia Procedure Notes (Signed)
Procedure Name: LMA Insertion Date/Time: 09/21/2018 7:41 AM Performed by: Teressa Lower., CRNA Pre-anesthesia Checklist: Patient identified, Emergency Drugs available, Suction available, Patient being monitored and Timeout performed Patient Re-evaluated:Patient Re-evaluated prior to induction Oxygen Delivery Method: Circle system utilized Preoxygenation: Pre-oxygenation with 100% oxygen Induction Type: IV induction Ventilation: Mask ventilation without difficulty LMA: LMA inserted LMA Size: 4.0 Number of attempts: 1 Placement Confirmation: positive ETCO2 and breath sounds checked- equal and bilateral Tube secured with: Tape Dental Injury: Teeth and Oropharynx as per pre-operative assessment

## 2018-09-21 NOTE — Transfer of Care (Signed)
Immediate Anesthesia Transfer of Care Note  Patient: Latoya Lopez  Procedure(s) Performed: INSERTION OF DIALYSIS CATHETER (Right )  Patient Location: PACU  Anesthesia Type:General  Level of Consciousness: awake, alert  and oriented  Airway & Oxygen Therapy: Patient Spontanous Breathing  Post-op Assessment: Report given to RN and Post -op Vital signs reviewed and stable  Post vital signs: Reviewed and stable  Last Vitals:  Vitals Value Taken Time  BP    Temp    Pulse 96 09/21/2018  8:18 AM  Resp 7 09/21/2018  8:18 AM  SpO2 98 % 09/21/2018  8:18 AM  Vitals shown include unvalidated device data.  Last Pain:  Vitals:   09/21/18 0627  TempSrc: Oral  PainSc:          Complications: No apparent anesthesia complications

## 2018-09-21 NOTE — Progress Notes (Signed)
PROGRESS NOTE    Latoya Lopez  RJJ:[id number] DOB: 1941/08/19 DOA: 09/20/2018 PCP: [place]    Brief Narrative:  77 y.o. female, with past medical history significant for end-stage renal disease on hemodialysis status post revision of dialysis fistula on the right hand on Wednesday who was sent from her dialysis unit today due to incomplete dialysis and bleeding from her right AV fistula.  Nephrology was consulted and asked for the patient to be admitted for dialysis access and management of her right fistula bleeding.  Patient has no complaints.  Denies any chest pains shortness of breath nausea vomiting or diarrhea.  Her lab work was fair.  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 Vascular surgery rec to rest graft  History of end-stage renal disease on hemodialysis Tuesday Thursday and Saturday -discussed with Nephrology -Recommendation for HD tomorrow  Hyperlipidemia;  -continue with Lipitor as tolerated -Seems stable  Hypertension  -continue with Coreg as tolerated -BP stable at this time  Hypothyroidism  -continue with Synthroid as tolerated  Anemia of chronic disease  to end-stage renal disease -hemodynamically stable at this time  DVT prophylaxis: SCD's Code Status: DNR Family Communication: Pt in room, family at bedside Disposition Plan: Possible d/c home in 24-48hrs  Consultants:   Vascular Surgery  Nephrology  Procedures:   [facility] placed 11/10  Antimicrobials: Anti-infectives (From admission, onward)   None       Subjective: Without complaints at this time  Objective: Vitals:   09/21/18 0830 09/21/18 0845 09/21/18 0912 09/21/18 1402  BP: (!) 157/73  (!) 171/84 (!) 146/70  Pulse: 95 95 97 88  Resp: 14 18 17 18   Temp:    (!) 97.5 F (36.4 C) 98.2 F (36.8 C)  TempSrc:   Oral Oral  SpO2: 96% 96% 97% 98%  Height:        Intake/Output Summary (Last 24 hours) at 09/21/2018 1426 Last data filed at 09/21/2018 1400 Gross per 24 hour  Intake 740 ml  Output 355 ml  Net 385 ml   There were no vitals filed for this visit.  Examination:  General exam: Appears calm and comfortable  Respiratory system: Clear to auscultation. Respiratory effort normal. Cardiovascular system: S1 & S2 heard, RRR Gastrointestinal system: Abdomen is nondistended, soft and nontender. No organomegaly or masses felt. Normal bowel sounds heard. Central nervous system: Alert and oriented. No focal neurological deficits. Extremities: Symmetric 5 x 5 power. Skin: No rashes, lesions Psychiatry: Judgement and insight appear normal. Mood & affect appropriate.   Data Reviewed: I have personally reviewed following labs and imaging studies  CBC: Recent Labs  Lab 09/17/18 1157 09/17/18 1402 09/20/18 1310 09/21/18 0442  WBC 5.9  --  4.9 5.0  NEUTROABS 3.4  --  2.6  --   HGB 10.8* 9.5* 8.7* 7.9*  HCT 36.1 28.0* 30.3* 26.6*  MCV 100.8*  --  106.3* 103.1*  PLT 156  --  135* 016*   Basic Metabolic Panel: Recent Labs  Lab 09/17/18 1402 09/20/18 1310 09/21/18 0442  NA 142 140 138  K 4.4 4.9 4.9  CL  --  109 109  CO2  --  21* 21*  GLUCOSE 86 96 106*  BUN  --  55* 66*  CREATININE  --  7.82* 8.15*  CALCIUM  --  9.5 9.1  PHOS  --   --  4.3   GFR: Estimated Creatinine Clearance: 6.2 mL/min (A) (by C-G formula based on SCr of 8.15 mg/dL (H)). Liver Function Tests: Recent Labs  Lab 09/21/18 0442  ALBUMIN 2.7*   No results for input(s): LIPASE, AMYLASE in the last 168 hours. No results for input(s): AMMONIA in the last 168 hours. Coagulation Profile: No results for input(s): INR, PROTIME in the last 168 hours. Cardiac Enzymes: No results for input(s): CKTOTAL, CKMB, CKMBINDEX, TROPONINI in the last 168 hours. BNP (last 3  results) No results for input(s): PROBNP in the last 8760 hours. HbA1C: No results for input(s): HGBA1C in the last 72 hours. CBG: No results for input(s): GLUCAP in the last 168 hours. Lipid Profile: No results for input(s): CHOL, HDL, LDLCALC, TRIG, CHOLHDL, LDLDIRECT in the last 72 hours. Thyroid Function Tests: No results for input(s): TSH, T4TOTAL, FREET4, T3FREE, THYROIDAB in the last 72 hours. Anemia Panel: No results for input(s): VITAMINB12, FOLATE, FERRITIN, TIBC, IRON, RETICCTPCT in the last 72 hours. Sepsis Labs: No results for input(s): PROCALCITON, LATICACIDVEN in the last 168 hours.  Recent Results (from the past 240 hour(s))  Surgical PCR screen     Status: None   Collection Time: 09/21/18  3:45 AM  Result Value Ref Range Status   MRSA, PCR NEGATIVE NEGATIVE Final   Staphylococcus aureus NEGATIVE NEGATIVE Final    Comment: (NOTE) The Xpert SA Assay (FDA approved for NASAL specimens in patients 44 years of age and older), is one component of a comprehensive surveillance program. It is not intended to diagnose infection nor to guide or monitor treatment. Performed at [facility], [place] [address], [place], [place] [address]      Radiology Studies: Dg Chest 1 View  Result Date: 09/21/2018 CLINICAL DATA:  S/p TD see placement EXAM: CHEST  1 VIEW COMPARISON:  08/17/2017 FINDINGS: Dual lumen dialysis catheter placed from a right internal jugular approach. Tip is in the right atrium. Mild cardiac enlargement. Chronic aortic atherosclerosis. Pulmonary venous hypertension without frank edema, infiltrate, collapse or effusion. Chronic interstitial markings. IMPRESSION: Catheter tip in the right atrium.  No pneumothorax. Electronically Signed   By: Nelson Chimes M.D.   On: 09/21/2018 09:02   Dg Fluoro Guide Cv Line-no Report  Result Date: 09/21/2018 Fluoroscopy was utilized by the requesting physician.  No radiographic interpretation.    Scheduled Meds: .  atorvastatin  80 mg Oral Q breakfast  . calcium acetate  1,334 mg Oral TID WC  . carvedilol  12.5 mg Oral 2 times per day on Sun Mon Wed Fri  . Chlorhexidine Gluconate Cloth  6 each Topical Q0600  . [START ON 09/22/2018] darbepoetin (ARANESP) injection - DIALYSIS  200 mcg Intravenous Q Mon-HD  . levothyroxine  75 mcg Oral Q0600  . loratadine  10 mg Oral Q breakfast  . pantoprazole  40 mg Oral Daily  . raloxifene  60 mg Oral Q breakfast  . sodium chloride flush  3 mL Intravenous Q12H   Continuous Infusions: . sodium chloride Stopped (09/21/18 0820)     LOS: 1 day   Marylu Lund, MD [facility] Pager On Amion  If 7PM-7AM, please contact night-coverage 09/21/2018, 2:26 PM

## 2018-09-21 NOTE — Progress Notes (Addendum)
 [facility] Progress Note   Subjective: No new complaints. New RIJ TDC placed this AM. Denies SOB, says she doesn't feel like she needs dialysis today.     Objective Vitals:   09/21/18 0817 09/21/18 0830 09/21/18 0845 09/21/18 0912  BP: (!) 169/87 (!) 157/73  (!) 171/84  Pulse: 94 95 95 97  Resp: 16 14 18 17   Temp: (!) 97.2 F (36.2 C)   (!) 97.5 F (36.4 C)  TempSrc:    Oral  SpO2: 97% 96% 96% 97%  Height:       Physical Exam General: Pleasant elderly female in NAD Heart: S1,S2 RRR Lungs: CTAB Abdomen:S, NT Extremities: No LE edema Dialysis Access: RIJ TDC drsg CDI. RUA AVG + thrill/bruit. Ecchymosis present but not as edematous as yesterday.    Additional Objective Labs: Basic Metabolic Panel: Recent Labs  Lab 09/17/18 1402 09/20/18 1310 09/21/18 0442  NA 142 140 138  K 4.4 4.9 4.9  CL  --  109 109  CO2  --  21* 21*  GLUCOSE 86 96 106*  BUN  --  55* 66*  CREATININE  --  7.82* 8.15*  CALCIUM  --  9.5 9.1  PHOS  --   --  4.3   Liver Function Tests: Recent Labs  Lab 09/21/18 0442  ALBUMIN 2.7*   No results for input(s): LIPASE, AMYLASE in the last 168 hours. CBC: Recent Labs  Lab 09/17/18 1157 09/17/18 1402 09/20/18 1310 09/21/18 0442  WBC 5.9  --  4.9 5.0  NEUTROABS 3.4  --  2.6  --   HGB 10.8* 9.5* 8.7* 7.9*  HCT 36.1 28.0* 30.3* 26.6*  MCV 100.8*  --  106.3* 103.1*  PLT 156  --  135* 131*   Blood Culture No results found for: SDES, SPECREQUEST, CULT, REPTSTATUS  Cardiac Enzymes: No results for input(s): CKTOTAL, CKMB, CKMBINDEX, TROPONINI in the last 168 hours. CBG: No results for input(s): GLUCAP in the last 168 hours. Iron Studies: No results for input(s): IRON, TIBC, TRANSFERRIN, FERRITIN in the last 72 hours. @lablastinr3 @ Studies/Results: Dg Chest 1 View  Result Date: 09/21/2018 CLINICAL DATA:  S/p TD see placement EXAM: CHEST  1 VIEW COMPARISON:  08/17/2017 FINDINGS: Dual lumen dialysis catheter placed from a  right internal jugular approach. Tip is in the right atrium. Mild cardiac enlargement. Chronic aortic atherosclerosis. Pulmonary venous hypertension without frank edema, infiltrate, collapse or effusion. Chronic interstitial markings. IMPRESSION: Catheter tip in the right atrium.  No pneumothorax. Electronically Signed   By: Nelson Chimes M.D.   On: 09/21/2018 09:02   Dg Fluoro Guide Cv Line-no Report  Result Date: 09/21/2018 Fluoroscopy was utilized by the requesting physician.  No radiographic interpretation.   Medications: . sodium chloride Stopped (09/21/18 0820)   . atorvastatin  80 mg Oral Q breakfast  . calcium acetate  1,334 mg Oral TID WC  . carvedilol  12.5 mg Oral 2 times per day on Sun Mon Wed Fri  . levothyroxine  75 mcg Oral Q0600  . loratadine  10 mg Oral Q breakfast  . pantoprazole  40 mg Oral Daily  . raloxifene  60 mg Oral Q breakfast  . sodium chloride flush  3 mL Intravenous Q12H     Dialysis Orders: [place] T,Th,S 3 hr 45 min 180NRe 250/800 65.5 kg 2.0 K/ 2.25 Ca UFP 2 RUA AVG using 17g needles post revision -Heparin 1800 units IV TIW -Mircera 200 mcg IV q 2 weeks (last dose 09/09/18)  Assessment/Plan: 1.  Bleeding from revision site of AVG. VVS consulted. TDC placed this AM per Dr. Carlis Abbott. Rest resected AVG for next month. Appreciate VVS assistance.  2.  ESRD -  T,Th,S HD 09/18/18, missed HD 09/20/18. HD tomorrow off schedule then hopefully back on schedule Tuesday at OP unit. HD 1st shift. K+ 4.9 3.  Hypertension/volume  - BP well controlled. No evidence of volume overload by exam.  4.  Anemia  - HGB 7.9 Last HGB at OP center 9.5 09/18/18. EBL from AVG bleeding approx 50 cc. Recent ESA dose. Redose Aranesp with HD tomorrow. Follow HGB.   5.  Metabolic bone disease - Ca 9.1 Phos 4.3 Continue binders, no VDRA 6.  Nutrition - Albumin 2.7. Renal Diet, prostat,     H.  NP-C 09/21/2018, 11:01 AM  [employer] [phone]

## 2018-09-21 NOTE — Plan of Care (Signed)
  Problem: Education: Goal: Knowledge of General Education information will improve Description: Including pain rating scale, medication(s)/side effects and non-pharmacologic comfort measures Outcome: Progressing   Problem: Activity: Goal: Risk for activity intolerance will decrease Outcome: Progressing   

## 2018-09-22 ENCOUNTER — Encounter (HOSPITAL_COMMUNITY): Payer: Self-pay | Admitting: Vascular Surgery

## 2018-09-22 DIAGNOSIS — I10 Essential (primary) hypertension: Secondary | ICD-10-CM

## 2018-09-22 LAB — RENAL FUNCTION PANEL
Albumin: 2.9 g/dL — ABNORMAL LOW (ref 3.5–5.0)
Anion gap: 9 (ref 5–15)
BUN: 85 mg/dL — ABNORMAL HIGH (ref 8–23)
CO2: 21 mmol/L — ABNORMAL LOW (ref 22–32)
Calcium: 8.8 mg/dL — ABNORMAL LOW (ref 8.9–10.3)
Chloride: 107 mmol/L (ref 98–111)
Creatinine, Ser: 8.83 mg/dL — ABNORMAL HIGH (ref 0.44–1.00)
GFR calc Af Amer: 4 mL/min — ABNORMAL LOW (ref >60)
GFR calc non Af Amer: 4 mL/min — ABNORMAL LOW (ref >60)
Glucose, Bld: 89 mg/dL (ref 70–99)
Phosphorus: 3.9 mg/dL (ref 2.5–4.6)
Potassium: 4.8 mmol/L (ref 3.5–5.1)
Sodium: 137 mmol/L (ref 135–145)

## 2018-09-22 LAB — CBC
HCT: 24.7 % — ABNORMAL LOW (ref 36.0–46.0)
Hemoglobin: 7.5 g/dL — ABNORMAL LOW (ref 12.0–15.0)
MCH: 30.9 pg (ref 26.0–34.0)
MCHC: 30.4 g/dL (ref 30.0–36.0)
MCV: 101.6 fL — AB (ref 80.0–100.0)
NRBC: 0 % (ref 0.0–0.2)
Platelets: 139 10*3/uL — ABNORMAL LOW (ref 150–400)
RBC: 2.43 MIL/uL — AB (ref 3.87–5.11)
RDW: 16.7 % — ABNORMAL HIGH (ref 11.5–15.5)
WBC: 5.8 10*3/uL (ref 4.0–10.5)

## 2018-09-22 MED ORDER — DARBEPOETIN ALFA 200 MCG/0.4ML IJ SOSY
PREFILLED_SYRINGE | INTRAMUSCULAR | Status: AC
  Administered 2018-09-22: 200 ug via INTRAVENOUS
  Filled 2018-09-22: qty 0.4

## 2018-09-22 MED ORDER — HEPARIN SODIUM (PORCINE) 1000 UNIT/ML IJ SOLN
INTRAMUSCULAR | Status: AC
  Administered 2018-09-22: 1000 [IU] via INTRAVENOUS_CENTRAL
  Filled 2018-09-22: qty 2

## 2018-09-22 MED ORDER — ALTEPLASE 2 MG IJ SOLR: 2.0000 mg | Freq: Once | INTRAMUSCULAR | Status: DC | PRN

## 2018-09-22 MED ORDER — LIDOCAINE HCL (PF) 1 % IJ SOLN: 5.0000 mL | INTRAMUSCULAR | Status: DC | PRN

## 2018-09-22 MED ORDER — SODIUM CHLORIDE 0.9 % IV SOLN: 100.0000 mL | INTRAVENOUS | Status: DC | PRN

## 2018-09-22 MED ORDER — PENTAFLUOROPROP-TETRAFLUOROETH EX AERO: 1.0000 "application " | INHALATION_SPRAY | CUTANEOUS | Status: DC | PRN

## 2018-09-22 MED ORDER — HEPARIN SODIUM (PORCINE) 1000 UNIT/ML DIALYSIS
1000.0000 [IU] | INTRAMUSCULAR | Status: DC | PRN
  Administered 2018-09-22: 1000 [IU] via INTRAVENOUS_CENTRAL
  Filled 2018-09-22 (×2): qty 1

## 2018-09-22 MED ORDER — LIDOCAINE-PRILOCAINE 2.5-2.5 % EX CREA
1.0000 "application " | TOPICAL_CREAM | CUTANEOUS | Status: DC | PRN
  Filled 2018-09-22: qty 5

## 2018-09-22 NOTE — Plan of Care (Signed)

## 2018-09-22 NOTE — Progress Notes (Signed)
Ephriam Knuckles to be D/C'd  per MD order. Discussed with the patient and all questions fully answered.  VSS, Skin clean, dry and intact without evidence of skin break down, no evidence of skin tears noted.  IV catheter discontinued intact. Site without signs and symptoms of complications. Dressing and pressure applied.  An After Visit Summary was printed and given to the patient.  D/c education completed with patient/family including follow up instructions, medication list, d/c activities limitations if indicated, with other d/c instructions as indicated by MD - patient able to verbalize understanding, all questions fully answered.   Patient instructed to return to ED, call 911, or call MD for any changes in condition.   Patient to be escorted via [place], and D/C home via private auto.

## 2018-09-22 NOTE — Progress Notes (Signed)
[facility] [place]  Subjective  - [facility] working well in dialysis.  No more bleeding from right arm graft revision.   Objective (!) 129/56 92 (!) 97.4 F (36.3 C) (Oral) 18 98%  Intake/Output Summary (Last 24 hours) at 09/22/2018 1153 Last data filed at 09/22/2018 0500 Gross per 24 hour  Intake 420 ml  Output 200 ml  Net 220 ml    NAD, resting Right upper extremity graft good thrill, ecchymosis, no hematoma RIJ [facility]  Laboratory Lab Results: Recent Labs    09/21/18 0442 09/22/18 0147  WBC 5.0 5.8  HGB 7.9* 7.5*  HCT 26.6* 24.7*  PLT 131* 139*   BMET Recent Labs    09/21/18 0442 09/22/18 0739  NA 138 137  K 4.9 4.8  CL 109 107  CO2 21* 21*  GLUCOSE 106* 89  BUN 66* 85*  CREATININE 8.15* 8.83*  CALCIUM 9.1 8.8*    COAG Lab Results  Component Value Date   INR 1.00 08/20/2017   INR 1.23 08/16/2017   No results found for: PTT  Assessment/Planning: Latoya Lopez working well.  Continue to rest AV graft revision to right upper extremity.  Has follow-up in VVS later this month already  - plan for wound check then.  Vascular will sign off.     Latoya Lopez 09/22/2018 11:53 AM --  Latoya Lopez

## 2018-09-22 NOTE — Discharge Summary (Signed)
Physician Discharge Summary  Latoya Lopez:[id number] DOB: 08/21/1941 DOA: 09/20/2018  PCP: [place] date: 09/20/2018 Discharge date: 09/22/2018  Admitted From: Home Disposition:  Home  Recommendations for Outpatient Follow-up:  1. Follow up with PCP in 3-4 weeks 2. Follow up with scheduled HD  Discharge Condition:Stable CODE STATUS:DNR Diet recommendation: Renal   Brief/Interim Summary: 77 y.o.female,with past medical history significant for end-stage renal disease on hemodialysis status post revision of dialysis fistula on the right hand on Wednesday who was sent from her dialysis unit today due to incomplete dialysis and bleeding from her right AV fistula. Nephrology was consulted and asked for the patient to be admitted for dialysis access and management of her right fistula bleeding. Patient has no complaints. Denies any chest pains shortness of breath nausea vomiting or diarrhea. Her lab work was fair.  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   atorvastatin 80 MG tablet Commonly known as:  LIPITOR Take 80 mg by mouth daily with breakfast.   calcium acetate 667 MG capsule Commonly known as:  PHOSLO Take 2 capsules (1,334 mg total) by mouth 3 (three) times daily with meals. What changed:    how much to take  when to take this   carvedilol 12.5 MG tablet Commonly known as:  COREG Take 12.5 mg by mouth 2 (two) times daily. Do not take on dialysis days, Tus/Thurs/Sat   CLARITIN 10 MG tablet Generic drug:  loratadine Take 10 mg by mouth daily with breakfast.   levothyroxine 75 MCG tablet Commonly known as:  SYNTHROID, LEVOTHROID Take 75 mcg by mouth daily before breakfast.   lidocaine-prilocaine cream Commonly known as:  EMLA Apply 1 application topically See admin instructions. Apply small amount to access site 1 to 2 hours prior to dialysis.   omeprazole 20 MG capsule Commonly known as:  PRILOSEC Take 20 mg by mouth daily before breakfast.   ONE-A-DAY 50 PLUS PO Take 1 tablet by mouth daily.   oxyCODONE-acetaminophen 5-325 MG tablet Commonly known as:  PERCOCET/ROXICET Take 1 tablet by mouth every 6 (six) hours as needed for up to 15 doses for severe pain.   raloxifene 60 MG tablet Commonly known as:  EVISTA Take 60 mg by mouth daily with breakfast.      Follow-up Information    [facility] [place]. Schedule an appointment as soon as possible for a visit in 3 week(s).   Contact information: [address] [place] [address] [phone]  Follow up with your dialysis as scheduled Follow up.          No Known Allergies  Consultations:  Vascular Surgery  Nephrology  Procedures/Studies: Dg Chest 1 View  Result Date: 09/21/2018 CLINICAL DATA:  S/p TD see placement EXAM: CHEST  1 VIEW COMPARISON:   08/17/2017 FINDINGS: Dual lumen dialysis catheter placed from a right internal jugular approach. Tip is in the right atrium. Mild cardiac enlargement. Chronic aortic atherosclerosis. Pulmonary venous hypertension without frank edema, infiltrate, collapse or effusion. Chronic interstitial markings. IMPRESSION: Catheter tip in the right atrium.  No pneumothorax. Electronically Signed   By: Nelson Chimes M.D.   On: 09/21/2018 09:02   Dg Fluoro Guide Cv Line-no Report  Result Date: 09/21/2018 Fluoroscopy was utilized by the requesting physician.  No radiographic interpretation.    Subjective: Nauseated on HD, otherwise eager to go home  Discharge Exam: Vitals:   09/22/18 1030 09/22/18 1138  BP: (!) 123/59 (!) 129/56  Pulse: 86 92  Resp:    Temp:  (!) 97.4 F (36.3 C)  SpO2:  98%   Vitals:   09/22/18 0930 09/22/18 1000 09/22/18 1030 09/22/18 1138  BP: 140/71 120/63 (!) 123/59 (!) 129/56  Pulse: 81 86 86 92  Resp:      Temp:    (!) 97.4 F (36.3 C)  TempSrc:    Oral  SpO2:    98%  Weight:      Height:        General: Pt is alert, awake, not in acute distress Cardiovascular: RRR, S1/S2 +, no rubs, no gallops Respiratory: CTA bilaterally, no wheezing, no rhonchi Abdominal: Soft, NT, ND, bowel sounds + Extremities: no edema, no cyanosis   The results of significant diagnostics from this hospitalization (including imaging, microbiology, ancillary and laboratory) are listed below for reference.     Microbiology: Recent Results (from the past 240 hour(s))  Surgical PCR screen     Status: None   Collection Time: 09/21/18  3:45 AM  Result Value Ref Range Status   MRSA, PCR NEGATIVE NEGATIVE Final   Staphylococcus aureus NEGATIVE NEGATIVE Final    Comment: (NOTE) The Xpert SA Assay (FDA approved for NASAL specimens in patients 42 years of age and older), is one component of a comprehensive surveillance program. It is not intended to diagnose infection nor to guide or monitor  treatment. Performed at [facility], [place] [address], [place], [place] [address]      Labs: BNP (last 3 results) No results for input(s): BNP in the last 8760 hours. Basic Metabolic Panel: Recent Labs  Lab 09/17/18 1402 09/20/18 1310 09/21/18 0442 09/22/18 0739  NA 142 140 138 137  K 4.4 4.9 4.9 4.8  CL  --  109 109 107  CO2  --  21* 21* 21*  GLUCOSE 86 96 106* 89  BUN  --  55* 66* 85*  CREATININE  --  7.82* 8.15* 8.83*  CALCIUM  --  9.5 9.1 8.8*  PHOS  --   --  4.3 3.9   Liver Function Tests: Recent Labs  Lab 09/21/18 0442 09/22/18 0739  ALBUMIN 2.7* 2.9*   No results for input(s): LIPASE, AMYLASE in the last 168 hours. No results for input(s): AMMONIA in the last 168 hours. CBC: Recent Labs  Lab 09/17/18 1157 09/17/18 1402 09/20/18 1310 09/21/18 0442 09/22/18 0147  WBC 5.9  --  4.9 5.0 5.8  NEUTROABS 3.4  --  2.6  --   --  HGB 10.8* 9.5* 8.7* 7.9* 7.5*  HCT 36.1 28.0* 30.3* 26.6* 24.7*  MCV 100.8*  --  106.3* 103.1* 101.6*  PLT 156  --  135* 131* 139*   Cardiac Enzymes: No results for input(s): CKTOTAL, CKMB, CKMBINDEX, TROPONINI in the last 168 hours. BNP: Invalid input(s): POCBNP CBG: No results for input(s): GLUCAP in the last 168 hours. D-Dimer No results for input(s): DDIMER in the last 72 hours. Hgb A1c No results for input(s): HGBA1C in the last 72 hours. Lipid Profile No results for input(s): CHOL, HDL, LDLCALC, TRIG, CHOLHDL, LDLDIRECT in the last 72 hours. Thyroid function studies No results for input(s): TSH, T4TOTAL, T3FREE, THYROIDAB in the last 72 hours.  Invalid input(s): FREET3 Anemia work up No results for input(s): VITAMINB12, FOLATE, FERRITIN, TIBC, IRON, RETICCTPCT in the last 72 hours. Urinalysis No results found for: COLORURINE, APPEARANCEUR, [place], [place], [place], [place], [place], [place], PROTEINUR, UROBILINOGEN, NITRITE, LEUKOCYTESUR Sepsis Labs Invalid input(s): PROCALCITONIN,  WBC,   LACTICIDVEN Microbiology Recent Results (from the past 240 hour(s))  Surgical PCR screen     Status: None   Collection Time: 09/21/18  3:45 AM  Result Value Ref Range Status   MRSA, PCR NEGATIVE NEGATIVE Final   Staphylococcus aureus NEGATIVE NEGATIVE Final    Comment: (NOTE) The Xpert SA Assay (FDA approved for NASAL specimens in patients 61 years of age and older), is one component of a comprehensive surveillance program. It is not intended to diagnose infection nor to guide or monitor treatment. Performed at [facility], [place] [address] [place], [place] [address]    Time spent: 30 min  SIGNED:   Marylu Lund, MD  [facility] 09/22/2018, 11:47 AM  If 7PM-7AM, please contact night-coverage

## 2018-09-22 NOTE — Care Management Note (Signed)
Case Management Note  Patient Details  Name: CHESNI VOS MRN: [id number] Date of Birth: 04/14/1941  Subjective/Objective:                    Action/Plan: Pt discharging home with self care. Pt has transportation home and supervision per her spouse.   Expected Discharge Date:  09/22/18               Expected Discharge Plan:  Home/Self Care  In-House Referral:     Discharge planning Services     Post Acute Care Choice:    Choice offered to:     DME Arranged:    DME Agency:     HH Arranged:    HH Agency:     Status of Service:  Completed, signed off  If discussed at [employer] of Stay Meetings, dates discussed:    Additional Comments:  Pollie Friar, RN 09/22/2018, 1:28 PM

## 2018-09-22 NOTE — Progress Notes (Addendum)
Subjective:  On HD  Co being having nausea, no arm pain or bleeding   Objective Vital signs in last 24 hours: Vitals:   09/22/18 0730 09/22/18 0800 09/22/18 0830 09/22/18 0900  BP: (!) 163/84 137/70 110/60 (!) 165/83  Pulse: 86 79 87 82  Resp:      Temp:      TempSrc:      SpO2:      Weight:      Height:       Weight change:   Physical Exam: General: alert ,elderly WF nad ,OX3 Heart: RRR Lungs: CTA Abdomen: BS pos, Soft , NT, ND Extremities:no0 pedal edema  Dialysis Access:  RIJ p. cath 400 bfr on hd , RUA AVF + bruit     OP Dialysis Orders:[facility] T,Th,S 3 hr 45 min 180NRe 250/800 65.5 kg 2.0 K/ 2.25 Ca UFP 2 RUA AVG using 17g needles post revision -Heparin 1800 units IV TIW -Mircera 200 mcg IV q 2 weeks (last dose 09/09/18)  Problem/Plan: 1. Left Brachiocephalic  AVF Bleeding sp 09/17/18 Revision   Pseudoaneurysm - VVS consulting with R IJ P Cath palced  09/21/18  To rest AVF  2. ESRD - HD TTS - Today missed sat  Hd /then back on TTS tomor. schedule, lab ok today /  3. Anemia  Of CKD and EBLA- HGB 7.5<7.9<8.7(11/09) Last Mircera 282mcg 09/09/18  ,next Aranesp 200 today   4. HTN/volume - bp stable and no ^ vol by exam, attempting 3950 cc for vol and not eating much/stop uf now after 2950  / Coreg 12.5mg  bid  5. Secondary hyperparathyroidism - ca / phos stable on binders no Vit D 6. Nutrition - renal diet and  renal vit   Ernest Haber, PA-C [facility] [phone] 09/22/2018,9:08 AM  LOS: 2 days   Labs: Basic Metabolic Panel: Recent Labs  Lab 09/20/18 1310 09/21/18 0442 09/22/18 0739  NA 140 138 137  K 4.9 4.9 4.8  CL 109 109 107  CO2 21* 21* 21*  GLUCOSE 96 106* 89  BUN 55* 66* 85*  CREATININE 7.82* 8.15* 8.83*  CALCIUM 9.5 9.1 8.8*  PHOS  --  4.3 3.9   Liver Function Tests: Recent Labs  Lab 09/21/18 0442 09/22/18 0739  ALBUMIN 2.7* 2.9*   No results for input(s): LIPASE, AMYLASE in the last 168 hours. No results for  input(s): AMMONIA in the last 168 hours. CBC: Recent Labs  Lab 09/17/18 1157  09/20/18 1310 09/21/18 0442 09/22/18 0147  WBC 5.9  --  4.9 5.0 5.8  NEUTROABS 3.4  --  2.6  --   --   HGB 10.8*   < > 8.7* 7.9* 7.5*  HCT 36.1   < > 30.3* 26.6* 24.7*  MCV 100.8*  --  106.3* 103.1* 101.6*  PLT 156  --  135* 131* 139*   < > = values in this interval not displayed.   Cardiac Enzymes: No results for input(s): CKTOTAL, CKMB, CKMBINDEX, TROPONINI in the last 168 hours. CBG: No results for input(s): GLUCAP in the last 168 hours.  Studies/Results: Dg Chest 1 View  Result Date: 09/21/2018 CLINICAL DATA:  S/p TD see placement EXAM: CHEST  1 VIEW COMPARISON:  08/17/2017 FINDINGS: Dual lumen dialysis catheter placed from a right internal jugular approach. Tip is in the right atrium. Mild cardiac enlargement. Chronic aortic atherosclerosis. Pulmonary venous hypertension without frank edema, infiltrate, collapse or effusion. Chronic interstitial markings. IMPRESSION: Catheter tip in the right atrium.  No  pneumothorax. Electronically Signed   By: Nelson Chimes M.D.   On: 09/21/2018 09:02   Dg Fluoro Guide Cv Line-no Report  Result Date: 09/21/2018 Fluoroscopy was utilized by the requesting physician.  No radiographic interpretation.   Medications: . sodium chloride Stopped (09/21/18 0820)  . sodium chloride    . sodium chloride     . atorvastatin  80 mg Oral Q breakfast  . calcium acetate  1,334 mg Oral TID WC  . carvedilol  12.5 mg Oral 2 times per day on Sun Mon Wed Fri  . Chlorhexidine Gluconate Cloth  6 each Topical Q0600  . darbepoetin (ARANESP) injection - DIALYSIS  200 mcg Intravenous Q Mon-HD  . heparin      . levothyroxine  75 mcg Oral Q0600  . loratadine  10 mg Oral Q breakfast  . pantoprazole  40 mg Oral Daily  . raloxifene  60 mg Oral Q breakfast  . sodium chloride flush  3 mL Intravenous Q12H

## 2018-10-01 ENCOUNTER — Ambulatory Visit (INDEPENDENT_AMBULATORY_CARE_PROVIDER_SITE_OTHER): Payer: Self-pay | Admitting: Physician Assistant

## 2018-10-01 ENCOUNTER — Other Ambulatory Visit: Payer: Self-pay

## 2018-10-01 VITALS — BP 152/82 | HR 96 | Temp 98.8°F | Resp 16 | Ht 69.0 in | Wt 148.0 lb

## 2018-10-01 DIAGNOSIS — N186 End stage renal disease: Secondary | ICD-10-CM

## 2018-10-01 NOTE — Progress Notes (Signed)
POST OPERATIVE OFFICE NOTE    CC:  F/u for surgery  HPI:  This is a 77 y.o. female who is s/p thrombectomy of RUE AVG, excision of exposed Gore-Tex graft in the mid upper arm and primary closure of the skin and revision of Gore-Tex graft with interposition 67mm Gore-Tex graft on 09/17/18 by Dr..Brabham and then underwent tunneled dialysis catheter by Dr. Carlis Abbott on 09/21/18.  Plan was to rest the graft for around 4 weeks.  She is here today for follow up.  She states that her catheter is working well and it is being used T/T/S at the [facility] location.  She states she had a little bit of clear drainage from the upper incision yesterday.  She denies any pain in her right hand.    No Known Allergies  Current Outpatient Medications  Medication Sig Dispense Refill  . acetaminophen (TYLENOL) 325 MG tablet Take 325 mg by mouth every 6 (six) hours as needed for mild pain.    Marland Kitchen atorvastatin (LIPITOR) 80 MG tablet Take 80 mg by mouth daily with breakfast.     . calcium acetate (PHOSLO) 667 MG capsule Take 2 capsules (1,334 mg total) by mouth 3 (three) times daily with meals. (Patient taking differently: Take 2,001 mg by mouth 2 (two) times daily. ) 90 capsule 0  . carvedilol (COREG) 12.5 MG tablet Take 12.5 mg by mouth 2 (two) times daily. Do not take on dialysis days, Tus/Thurs/Sat    . levothyroxine (SYNTHROID, LEVOTHROID) 75 MCG tablet Take 75 mcg by mouth daily before breakfast.    . lidocaine-prilocaine (EMLA) cream Apply 1 application topically See admin instructions. Apply small amount to access site 1 to 2 hours prior to dialysis.  12  . loratadine (CLARITIN) 10 MG tablet Take 10 mg by mouth daily with breakfast.     . Multiple Vitamins-Minerals (ONE-A-DAY 50 PLUS PO) Take 1 tablet by mouth daily.    Marland Kitchen omeprazole (PRILOSEC) 20 MG capsule Take 20 mg by mouth daily before breakfast.     . oxyCODONE-acetaminophen (PERCOCET) 5-325 MG tablet Take 1 tablet by mouth every 6 (six) hours as needed for up  to 15 doses for severe pain. 15 tablet 0  . raloxifene (EVISTA) 60 MG tablet Take 60 mg by mouth daily with breakfast.      No current facility-administered medications for this visit.      ROS:  See HPI  Physical Exam:  Today's Vitals   10/01/18 1504  BP: (!) 152/82  Pulse: 96  Resp: 16  Temp: 98.8 F (37.1 C)  TempSrc: Oral  SpO2: 95%  Weight: 148 lb (67.1 kg)  Height: 5\' 9"  (1.753 m)   Body mass index is 21.86 kg/m.  Incision:  Incisions have healed nicely with superficial scab on proximal incision and smaller one on distal incision.  Extremities:  + palpable right radial pulse.  There is a thrill/bruit within the graft.  There is still some swelling around the graft.    Assessment/Plan:  This is a 77 y.o. female who is s/p: thrombectomy of RUE AVG, excision of exposed Gore-Tex graft in the mid upper arm and primary closure of the skin and revision of Gore-Tex graft with interposition 36mm Gore-Tex graft on 09/17/18 by Dr..Brabham and then underwent tunneled dialysis catheter by Dr. Carlis Abbott on 09/21/18   -pt's incisions have healed nicely-will get sutures out today.   Pt has a palpable right radial pulse and no evidence of steal sx -there is still some swelling  around the graft-will continue to rest graft until 10/27/18 and then may start using graft.  We will see her back as needed.    Leontine Locket, PA-C [facility] [phone]  Clinic MD:  Scot Dock

## 2019-05-22 ENCOUNTER — Other Ambulatory Visit: Payer: Self-pay

## 2019-05-22 ENCOUNTER — Telehealth (HOSPITAL_COMMUNITY): Payer: Self-pay

## 2019-05-22 DIAGNOSIS — N186 End stage renal disease: Secondary | ICD-10-CM

## 2019-05-22 DIAGNOSIS — Z992 Dependence on renal dialysis: Secondary | ICD-10-CM

## 2019-05-22 NOTE — Telephone Encounter (Signed)
The above patient or their representative was contacted and gave the following answers to these questions:         Do you have any of the following symptoms? No  Fever                    Cough                   Shortness of breath  Do  you have any of the following other symptoms?    muscle pain         vomiting,        diarrhea        rash         weakness        red eye        abdominal pain         bruising          bruising or bleeding              joint pain           severe headache    Have you been in contact with someone who was or has been sick in the past 2 weeks? No  Yes                 Unsure                         Unable to assess   Does the person that you were in contact with have any of the following symptoms?  No  Cough         shortness of breath           muscle pain         vomiting,            diarrhea            rash            weakness           fever            red eye           abdominal pain           bruising  or  bleeding                joint pain                severe headache               Have you  or someone you have been in contact with traveled internationally in th last month?   No       If yes, which countries?   Have you  or someone you have been in contact with traveled outside [place] in th last month?  No        If yes, which state and city?   COMMENTS OR ACTION PLAN FOR THIS PATIENT:

## 2019-05-25 ENCOUNTER — Ambulatory Visit (INDEPENDENT_AMBULATORY_CARE_PROVIDER_SITE_OTHER): Payer: Medicare Other | Admitting: Surgery

## 2019-05-25 ENCOUNTER — Other Ambulatory Visit: Payer: Self-pay

## 2019-05-25 ENCOUNTER — Ambulatory Visit (INDEPENDENT_AMBULATORY_CARE_PROVIDER_SITE_OTHER)
Admission: RE | Admit: 2019-05-25 | Discharge: 2019-05-25 | Disposition: A | Discharge status: Home / Self Care | Payer: Medicare Other | Source: Ambulatory Visit | Attending: Family | Admitting: Family

## 2019-05-25 ENCOUNTER — Encounter: Payer: Self-pay | Admitting: Surgery

## 2019-05-25 ENCOUNTER — Other Ambulatory Visit: Payer: Self-pay | Admitting: *Deleted

## 2019-05-25 ENCOUNTER — Encounter: Payer: Self-pay | Admitting: *Deleted

## 2019-05-25 ENCOUNTER — Ambulatory Visit (HOSPITAL_COMMUNITY)
Admission: RE | Admit: 2019-05-25 | Discharge: 2019-05-25 | Disposition: A | Discharge status: Home / Self Care | Payer: Medicare Other | Source: Ambulatory Visit | Attending: Family | Admitting: Family

## 2019-05-25 VITALS — BP 153/79 | HR 81 | Temp 97.2°F | Resp 20 | Ht 69.0 in | Wt 154.0 lb

## 2019-05-25 DIAGNOSIS — Z992 Dependence on renal dialysis: Secondary | ICD-10-CM | POA: Insufficient documentation

## 2019-05-25 DIAGNOSIS — N186 End stage renal disease: Secondary | ICD-10-CM

## 2019-05-25 NOTE — Progress Notes (Signed)
Vascular and Vein Specialist of [place]  Patient name: Latoya Lopez MRN: [id number] DOB: [birth year] Sex: female   REASON FOR VISIT:    Follow up  [place]:    Latoya Lopez is a 78 y.o. female with end-stage renal disease on dialysis Tuesday Thursday Saturday.  She had a right upper arm graft placed in 2018 which was revised and 2019 and has gone on to occlude.  She now has dialysis via a left-sided catheter.  She does not have a pacemaker or defibrillator in place.  She states that she is ambidextrous.  She is here today to discuss new access issues.  She takes a statin for hypercholesterolemia.  She is medically managed for hypertension.  She is a former smoker.   PAST MEDICAL HISTORY:   Past Medical History:  Diagnosis Date  . Anemia of renal disease   . Cancer ([place])    skin  . Chronic kidney disease    Stage IV  . GERD (gastroesophageal reflux disease)   . Hyperkalemia   . Hyperlipidemia   . Hyperphosphatemia   . Hypertension    Essential with goal blood pressure less than 130/80  . Hypokalemia   . Hypoparathyroidism ([place])   . Hypothyroidism   . Membranous glomerulonephritis   . Proteinuria   . S/P partial hysterectomy   . Seasonal allergies   . Secondary hyperparathyroidism, renal ([place])      FAMILY HISTORY:   Family History  Problem Relation Age of Onset  . Stroke Mother   . Heart attack Father   . Heart attack Sister     SOCIAL HISTORY:   Social History   Tobacco Use  . Smoking status: Former Smoker    Types: Cigarettes  . Smokeless tobacco: Never Used  . Tobacco comment: quit smoking cigarettes in 1997  Substance Use Topics  . Alcohol use: No     ALLERGIES:   No Known Allergies   CURRENT MEDICATIONS:   Current Outpatient Medications  Medication Sig Dispense Refill  . acetaminophen (TYLENOL) 325 MG tablet Take 325 mg by mouth every 6 (six) hours as needed for mild pain.     Marland Kitchen amLODipine (NORVASC) 10 MG tablet TAKE 1 TABLET BY MOUTH EVERYDAY AT BEDTIME    . atorvastatin (LIPITOR) 80 MG tablet Take 80 mg by mouth daily with breakfast.     . calcium acetate (PHOSLO) 667 MG capsule Take 2 capsules (1,334 mg total) by mouth 3 (three) times daily with meals. (Patient taking differently: Take 2,001 mg by mouth 2 (two) times daily. ) 90 capsule 0  . carvedilol (COREG) 12.5 MG tablet Take 12.5 mg by mouth 2 (two) times daily. Do not take on dialysis days, Tus/Thurs/Sat    . levothyroxine (SYNTHROID, LEVOTHROID) 75 MCG tablet Take 75 mcg by mouth daily before breakfast.    . lidocaine-prilocaine (EMLA) cream Apply 1 application topically See admin instructions. Apply small amount to access site 1 to 2 hours prior to dialysis.  12  . loratadine (CLARITIN) 10 MG tablet Take 10 mg by mouth daily with breakfast.     . Multiple Vitamins-Minerals (ONE-A-DAY 50 PLUS PO) Take 1 tablet by mouth daily.    Marland Kitchen omeprazole (PRILOSEC) 20 MG capsule Take 20 mg by mouth daily before breakfast.     . raloxifene (EVISTA) 60 MG tablet Take 60 mg by mouth daily with breakfast.      No current facility-administered medications for this visit.     REVIEW  OF SYSTEMS:   [X]  denotes positive finding, [ ]  denotes negative finding Cardiac  Comments:  Chest pain or chest pressure:    Shortness of breath upon exertion:    Short of breath when lying flat:    Irregular heart rhythm:        Vascular    Pain in calf, thigh, or hip brought on by ambulation:    Pain in feet at night that wakes you up from your sleep:     Blood clot in your veins:    Leg swelling:         Pulmonary    Oxygen at home:    Productive cough:     Wheezing:         Neurologic    Sudden weakness in arms or legs:     Sudden numbness in arms or legs:     Sudden onset of difficulty speaking or slurred speech:    Temporary loss of vision in one eye:     Problems with dizziness:         Gastrointestinal    Blood in  stool:     Vomited blood:         Genitourinary    Burning when urinating:     Blood in urine:        Psychiatric    Major depression:         Hematologic    Bleeding problems:    Problems with blood clotting too easily:        Skin    Rashes or ulcers:        Constitutional    Fever or chills:      PHYSICAL EXAM:   Vitals:   05/25/19 1136  BP: (!) 153/79  Pulse: 81  Resp: 20  Temp: (!) 97.2 F (36.2 C)  SpO2: 96%  Weight: 154 lb (69.9 kg)  Height: 5\' 9"  (1.753 m)    GENERAL: The patient is a well-nourished female, in no acute distress. The vital signs are documented above. CARDIAC: There is a regular rate and rhythm.  VASCULAR: Palpable left radial and brachial pulse PULMONARY: Non-labored respirations MUSCULOSKELETAL: There are no major deformities or cyanosis. NEUROLOGIC: No focal weakness or paresthesias are detected. SKIN: There are no ulcers or rashes noted. PSYCHIATRIC: The patient has a normal affect.  STUDIES:   I have reviewed the following:  Arterial: Right: Visualized brachial, radial, and ulnar arteries are patent. Left: Visualized brachial, radial, and ulnar arteries are patent.  +-----------------+-------------+----------+---------+ Right Cephalic   Diameter (cm)Depth (cm)Findings  +-----------------+-------------+----------+---------+ Shoulder             0.19                         +-----------------+-------------+----------+---------+ Prox upper arm       0.23                         +-----------------+-------------+----------+---------+ Mid upper arm        0.26                         +-----------------+-------------+----------+---------+ Dist upper arm       0.18                         +-----------------+-------------+----------+---------+ Antecubital fossa    0.21                         +-----------------+-------------+----------+---------+  Prox forearm         0.21                          +-----------------+-------------+----------+---------+ Mid forearm          0.26               branching +-----------------+-------------+----------+---------+  +-----------------+-------------+----------+---------+ Right Basilic    Diameter (cm)Depth (cm)Findings  +-----------------+-------------+----------+---------+ Prox upper arm       0.19                         +-----------------+-------------+----------+---------+ Mid upper arm        0.19                         +-----------------+-------------+----------+---------+ Dist upper arm       0.21               branching +-----------------+-------------+----------+---------+ Antecubital fossa    0.19                         +-----------------+-------------+----------+---------+  +-----------------+-------------+----------+---------+ Left Cephalic    Diameter (cm)Depth (cm)Findings  +-----------------+-------------+----------+---------+ Shoulder             0.19                         +-----------------+-------------+----------+---------+ Prox upper arm       0.26               branching +-----------------+-------------+----------+---------+ Mid upper arm        0.24                         +-----------------+-------------+----------+---------+ Dist upper arm       0.16                         +-----------------+-------------+----------+---------+ Antecubital fossa    0.20               branching +-----------------+-------------+----------+---------+ Prox forearm         0.20                         +-----------------+-------------+----------+---------+ Mid forearm          0.21               branching +-----------------+-------------+----------+---------+ Dist forearm         0.10                         +-----------------+-------------+----------+---------+  +-----------------+-------------+----------+---------+ Left Basilic     Diameter  (cm)Depth (cm)Findings  +-----------------+-------------+----------+---------+ Prox upper arm       0.24                         +-----------------+-------------+----------+---------+ Mid upper arm        0.19                         +-----------------+-------------+----------+---------+ Dist upper arm       0.21               branching +-----------------+-------------+----------+---------+ Antecubital fossa    0.18  branching +-----------------+-------------+----------+---------+ Prox forearm         0.20                         +-----------------+-------------+----------+---------+    MEDICAL ISSUES:   ESRD: We discussed proceeding with a left arm graft.  This may be a forearm versus an upper arm graft depending on the size of the brachial veins.  Based off her vein mapping today, I do not think that she is a good candidate for a fistula however this may change based on intraoperative interrogation of the vein.  All of her questions were answered today.  We will try to get her scheduled for this Friday, July 17.    Leia Alf, MD, FACS Vascular and Vein Specialists of [facility] [phone] Pager [phone]

## 2019-05-25 NOTE — H&P (View-Only) (Signed)
Vascular and Vein Specialist of [place]  Patient name: Latoya Lopez MRN: [id number] DOB: 1941/02/06 Sex: female   REASON FOR VISIT:    Follow up  [place]:    Latoya Lopez is a 78 y.o. female with end-stage renal disease on dialysis Tuesday Thursday Saturday.  She had a right upper arm graft placed in 2018 which was revised and 2019 and has gone on to occlude.  She now has dialysis via a left-sided catheter.  She does not have a pacemaker or defibrillator in place.  She states that she is ambidextrous.  She is here today to discuss new access issues.  She takes a statin for hypercholesterolemia.  She is medically managed for hypertension.  She is a former smoker.   PAST MEDICAL HISTORY:   Past Medical History:  Diagnosis Date  . Anemia of renal disease   . Cancer ([place])    skin  . Chronic kidney disease    Stage IV  . GERD (gastroesophageal reflux disease)   . Hyperkalemia   . Hyperlipidemia   . Hyperphosphatemia   . Hypertension    Essential with goal blood pressure less than 130/80  . Hypokalemia   . Hypoparathyroidism ([place])   . Hypothyroidism   . Membranous glomerulonephritis   . Proteinuria   . S/P partial hysterectomy   . Seasonal allergies   . Secondary hyperparathyroidism, renal ([place])      FAMILY HISTORY:   Family History  Problem Relation Age of Onset  . Stroke Mother   . Heart attack Father   . Heart attack Sister     SOCIAL HISTORY:   Social History   Tobacco Use  . Smoking status: Former Smoker    Types: Cigarettes  . Smokeless tobacco: Never Used  . Tobacco comment: quit smoking cigarettes in 1997  Substance Use Topics  . Alcohol use: No     ALLERGIES:   No Known Allergies   CURRENT MEDICATIONS:   Current Outpatient Medications  Medication Sig Dispense Refill  . acetaminophen (TYLENOL) 325 MG tablet Take 325 mg by mouth every 6 (six) hours as needed for mild pain.     Marland Kitchen amLODipine (NORVASC) 10 MG tablet TAKE 1 TABLET BY MOUTH EVERYDAY AT BEDTIME    . atorvastatin (LIPITOR) 80 MG tablet Take 80 mg by mouth daily with breakfast.     . calcium acetate (PHOSLO) 667 MG capsule Take 2 capsules (1,334 mg total) by mouth 3 (three) times daily with meals. (Patient taking differently: Take 2,001 mg by mouth 2 (two) times daily. ) 90 capsule 0  . carvedilol (COREG) 12.5 MG tablet Take 12.5 mg by mouth 2 (two) times daily. Do not take on dialysis days, Tus/Thurs/Sat    . levothyroxine (SYNTHROID, LEVOTHROID) 75 MCG tablet Take 75 mcg by mouth daily before breakfast.    . lidocaine-prilocaine (EMLA) cream Apply 1 application topically See admin instructions. Apply small amount to access site 1 to 2 hours prior to dialysis.  12  . loratadine (CLARITIN) 10 MG tablet Take 10 mg by mouth daily with breakfast.     . Multiple Vitamins-Minerals (ONE-A-DAY 50 PLUS PO) Take 1 tablet by mouth daily.    Marland Kitchen omeprazole (PRILOSEC) 20 MG capsule Take 20 mg by mouth daily before breakfast.     . raloxifene (EVISTA) 60 MG tablet Take 60 mg by mouth daily with breakfast.      No current facility-administered medications for this visit.     REVIEW  OF SYSTEMS:   [X]  denotes positive finding, [ ]  denotes negative finding Cardiac  Comments:  Chest pain or chest pressure:    Shortness of breath upon exertion:    Short of breath when lying flat:    Irregular heart rhythm:        Vascular    Pain in calf, thigh, or hip brought on by ambulation:    Pain in feet at night that wakes you up from your sleep:     Blood clot in your veins:    Leg swelling:         Pulmonary    Oxygen at home:    Productive cough:     Wheezing:         Neurologic    Sudden weakness in arms or legs:     Sudden numbness in arms or legs:     Sudden onset of difficulty speaking or slurred speech:    Temporary loss of vision in one eye:     Problems with dizziness:         Gastrointestinal    Blood in  stool:     Vomited blood:         Genitourinary    Burning when urinating:     Blood in urine:        Psychiatric    Major depression:         Hematologic    Bleeding problems:    Problems with blood clotting too easily:        Skin    Rashes or ulcers:        Constitutional    Fever or chills:      PHYSICAL EXAM:   Vitals:   05/25/19 1136  BP: (!) 153/79  Pulse: 81  Resp: 20  Temp: (!) 97.2 F (36.2 C)  SpO2: 96%  Weight: 154 lb (69.9 kg)  Height: 5\' 9"  (1.753 m)    GENERAL: The patient is a well-nourished female, in no acute distress. The vital signs are documented above. CARDIAC: There is a regular rate and rhythm.  VASCULAR: Palpable left radial and brachial pulse PULMONARY: Non-labored respirations MUSCULOSKELETAL: There are no major deformities or cyanosis. NEUROLOGIC: No focal weakness or paresthesias are detected. SKIN: There are no ulcers or rashes noted. PSYCHIATRIC: The patient has a normal affect.  STUDIES:   I have reviewed the following:  Arterial: Right: Visualized brachial, radial, and ulnar arteries are patent. Left: Visualized brachial, radial, and ulnar arteries are patent.  +-----------------+-------------+----------+---------+ Right Cephalic   Diameter (cm)Depth (cm)Findings  +-----------------+-------------+----------+---------+ Shoulder             0.19                         +-----------------+-------------+----------+---------+ Prox upper arm       0.23                         +-----------------+-------------+----------+---------+ Mid upper arm        0.26                         +-----------------+-------------+----------+---------+ Dist upper arm       0.18                         +-----------------+-------------+----------+---------+ Antecubital fossa    0.21                         +-----------------+-------------+----------+---------+  Prox forearm         0.21                          +-----------------+-------------+----------+---------+ Mid forearm          0.26               branching +-----------------+-------------+----------+---------+  +-----------------+-------------+----------+---------+ Right Basilic    Diameter (cm)Depth (cm)Findings  +-----------------+-------------+----------+---------+ Prox upper arm       0.19                         +-----------------+-------------+----------+---------+ Mid upper arm        0.19                         +-----------------+-------------+----------+---------+ Dist upper arm       0.21               branching +-----------------+-------------+----------+---------+ Antecubital fossa    0.19                         +-----------------+-------------+----------+---------+  +-----------------+-------------+----------+---------+ Left Cephalic    Diameter (cm)Depth (cm)Findings  +-----------------+-------------+----------+---------+ Shoulder             0.19                         +-----------------+-------------+----------+---------+ Prox upper arm       0.26               branching +-----------------+-------------+----------+---------+ Mid upper arm        0.24                         +-----------------+-------------+----------+---------+ Dist upper arm       0.16                         +-----------------+-------------+----------+---------+ Antecubital fossa    0.20               branching +-----------------+-------------+----------+---------+ Prox forearm         0.20                         +-----------------+-------------+----------+---------+ Mid forearm          0.21               branching +-----------------+-------------+----------+---------+ Dist forearm         0.10                         +-----------------+-------------+----------+---------+  +-----------------+-------------+----------+---------+ Left Basilic     Diameter  (cm)Depth (cm)Findings  +-----------------+-------------+----------+---------+ Prox upper arm       0.24                         +-----------------+-------------+----------+---------+ Mid upper arm        0.19                         +-----------------+-------------+----------+---------+ Dist upper arm       0.21               branching +-----------------+-------------+----------+---------+ Antecubital fossa    0.18  branching +-----------------+-------------+----------+---------+ Prox forearm         0.20                         +-----------------+-------------+----------+---------+    MEDICAL ISSUES:   ESRD: We discussed proceeding with a left arm graft.  This may be a forearm versus an upper arm graft depending on the size of the brachial veins.  Based off her vein mapping today, I do not think that she is a good candidate for a fistula however this may change based on intraoperative interrogation of the vein.  All of her questions were answered today.  We will try to get her scheduled for this Friday, July 17.    Leia Alf, MD, FACS Vascular and Vein Specialists of [facility] [phone] Pager [phone]

## 2019-05-27 ENCOUNTER — Other Ambulatory Visit: Payer: Self-pay

## 2019-05-27 ENCOUNTER — Other Ambulatory Visit (HOSPITAL_COMMUNITY)
Admission: RE | Admit: 2019-05-27 | Discharge: 2019-05-27 | Disposition: A | Discharge status: Home / Self Care | Payer: Medicare Other | Source: Ambulatory Visit | Attending: Surgery | Admitting: Surgery

## 2019-05-27 ENCOUNTER — Encounter (HOSPITAL_COMMUNITY): Payer: Self-pay | Admitting: *Deleted

## 2019-05-27 DIAGNOSIS — Z1159 Encounter for screening for other viral diseases: Secondary | ICD-10-CM | POA: Diagnosis present

## 2019-05-27 LAB — SARS CORONAVIRUS 2 (TAT 6-24 HRS): SARS Coronavirus 2: NEGATIVE

## 2019-05-27 NOTE — Progress Notes (Signed)
Pt denies SOB, chest pain, and being under the care of a cardiologist. Pt denies having a cardiac cath and stated that a stress test was performed > 10 years ago. Pt made aware to stop taking vitamins, fish oil and herbal medications. Do not take any NSAIDs ie: Ibuprofen, Advil, Naproxen (Aleve), Motrin, BC and Goody Powder. Pt denies that she and family members tested positive for COVID-19 ( pt tested on 05/27/19 ad reminded to quarantine).  Coronavirus Screening  Pt denies that she and family members experienced the following symptoms:  Cough yes/no: No Fever (>100.53F)  yes/no: No Runny nose yes/no: No Sore throat yes/no: No Difficulty breathing/shortness of breath  yes/no: No  Have you or a family member traveled in the last 14 days and where? yes/no: No  Pt reminded that hospital visitation restrictions are in effect and the importance of the restrictions.  Pt verbalized understanding of all pre-op instructions.

## 2019-05-28 NOTE — Anesthesia Preprocedure Evaluation (Addendum)
Anesthesia Evaluation  Patient identified by MRN, date of birth, ID band Patient awake    Reviewed: Allergy & Precautions, H&P , NPO status , Patient's Chart, lab work & pertinent test results, reviewed documented beta blocker date and time   Airway Mallampati: II  TM Distance: >3 FB Neck ROM: Full    Dental no notable dental hx. (+) Edentulous Upper, Edentulous Lower, Dental Advisory Given   Pulmonary former smoker,    Pulmonary exam normal breath sounds clear to auscultation       Cardiovascular Exercise Tolerance: Good hypertension, Pt. on medications and Pt. on home beta blockers +CHF   Rhythm:Regular Rate:Normal     Neuro/Psych negative neurological ROS  negative psych ROS   GI/Hepatic Neg liver ROS, GERD  Medicated and Controlled,  Endo/Other  Hypothyroidism   Renal/GU ESRF and DialysisRenal disease  negative genitourinary   Musculoskeletal   Abdominal   Peds  Hematology  (+) Blood dyscrasia, anemia ,   Anesthesia Other Findings   Reproductive/Obstetrics negative OB ROS                            Anesthesia Physical Anesthesia Plan  ASA: III  Anesthesia Plan: MAC   Post-op Pain Management:    Induction: Intravenous  PONV Risk Score and Plan: 3 and Propofol infusion, Ondansetron and Dexamethasone  Airway Management Planned: Simple Face Mask  Additional Equipment:   Intra-op Plan:   Post-operative Plan:   Informed Consent: I have reviewed the patients History and Physical, chart, labs and discussed the procedure including the risks, benefits and alternatives for the proposed anesthesia with the patient or authorized representative who has indicated his/her understanding and acceptance.     Dental advisory given  Plan Discussed with: CRNA  Anesthesia Plan Comments:         Anesthesia Quick Evaluation

## 2019-05-29 ENCOUNTER — Encounter (HOSPITAL_COMMUNITY): Payer: Self-pay | Admitting: *Deleted

## 2019-05-29 ENCOUNTER — Ambulatory Visit (HOSPITAL_COMMUNITY)
Admission: RE | Admit: 2019-05-29 | Discharge: 2019-05-29 | Disposition: A | Discharge status: Home / Self Care | Payer: Medicare Other | Attending: Surgery | Admitting: Surgery

## 2019-05-29 ENCOUNTER — Other Ambulatory Visit: Payer: Self-pay

## 2019-05-29 ENCOUNTER — Encounter (HOSPITAL_COMMUNITY)
Admission: RE | Disposition: A | Discharge status: Home / Self Care | Payer: Self-pay | Source: Home / Self Care | Attending: Surgery

## 2019-05-29 ENCOUNTER — Ambulatory Visit (HOSPITAL_COMMUNITY): Payer: Medicare Other | Admitting: Anesthesiology

## 2019-05-29 DIAGNOSIS — N185 Chronic kidney disease, stage 5: Secondary | ICD-10-CM

## 2019-05-29 DIAGNOSIS — Z823 Family history of stroke: Secondary | ICD-10-CM | POA: Diagnosis not present

## 2019-05-29 DIAGNOSIS — Z87891 Personal history of nicotine dependence: Secondary | ICD-10-CM | POA: Diagnosis not present

## 2019-05-29 DIAGNOSIS — X58XXXA Exposure to other specified factors, initial encounter: Secondary | ICD-10-CM | POA: Insufficient documentation

## 2019-05-29 DIAGNOSIS — Z90710 Acquired absence of both cervix and uterus: Secondary | ICD-10-CM | POA: Diagnosis not present

## 2019-05-29 DIAGNOSIS — E785 Hyperlipidemia, unspecified: Secondary | ICD-10-CM | POA: Diagnosis not present

## 2019-05-29 DIAGNOSIS — Z79899 Other long term (current) drug therapy: Secondary | ICD-10-CM | POA: Diagnosis not present

## 2019-05-29 DIAGNOSIS — N2581 Secondary hyperparathyroidism of renal origin: Secondary | ICD-10-CM | POA: Insufficient documentation

## 2019-05-29 DIAGNOSIS — E875 Hyperkalemia: Secondary | ICD-10-CM | POA: Diagnosis not present

## 2019-05-29 DIAGNOSIS — T82898A Other specified complication of vascular prosthetic devices, implants and grafts, initial encounter: Secondary | ICD-10-CM | POA: Diagnosis not present

## 2019-05-29 DIAGNOSIS — Z8249 Family history of ischemic heart disease and other diseases of the circulatory system: Secondary | ICD-10-CM | POA: Insufficient documentation

## 2019-05-29 DIAGNOSIS — E039 Hypothyroidism, unspecified: Secondary | ICD-10-CM | POA: Diagnosis not present

## 2019-05-29 DIAGNOSIS — D631 Anemia in chronic kidney disease: Secondary | ICD-10-CM | POA: Insufficient documentation

## 2019-05-29 DIAGNOSIS — K219 Gastro-esophageal reflux disease without esophagitis: Secondary | ICD-10-CM | POA: Insufficient documentation

## 2019-05-29 DIAGNOSIS — N186 End stage renal disease: Secondary | ICD-10-CM

## 2019-05-29 DIAGNOSIS — Z992 Dependence on renal dialysis: Secondary | ICD-10-CM | POA: Insufficient documentation

## 2019-05-29 DIAGNOSIS — I132 Hypertensive heart and chronic kidney disease with heart failure and with stage 5 chronic kidney disease, or end stage renal disease: Secondary | ICD-10-CM | POA: Diagnosis not present

## 2019-05-29 DIAGNOSIS — I509 Heart failure, unspecified: Secondary | ICD-10-CM | POA: Insufficient documentation

## 2019-05-29 HISTORY — DX: Presence of spectacles and contact lenses: Z97.3

## 2019-05-29 HISTORY — PX: AV FISTULA PLACEMENT: SHX1204

## 2019-05-29 HISTORY — DX: Cardiac murmur, unspecified: R01.1

## 2019-05-29 LAB — POCT I-STAT 4, (NA,K, GLUC, HGB,HCT)
Glucose, Bld: 115 mg/dL — ABNORMAL HIGH (ref 70–99)
HCT: 38 % (ref 36.0–46.0)
Hemoglobin: 12.9 g/dL (ref 12.0–15.0)
Potassium: 4 mmol/L (ref 3.5–5.1)
Sodium: 137 mmol/L (ref 135–145)

## 2019-05-29 SURGERY — ARTERIOVENOUS (AV) FISTULA CREATION
Anesthesia: Monitor Anesthesia Care | Site: Arm Upper | Laterality: Left

## 2019-05-29 MED ORDER — OXYCODONE HCL 5 MG PO TABS: 5.0000 mg | ORAL_TABLET | Freq: Four times a day (QID) | ORAL | 0 refills | Status: DC | PRN

## 2019-05-29 MED ORDER — HEPARIN SODIUM (PORCINE) 1000 UNIT/ML IJ SOLN
INTRAMUSCULAR | Status: DC | PRN
  Administered 2019-05-29: 3000 [IU] via INTRAVENOUS

## 2019-05-29 MED ORDER — SODIUM CHLORIDE 0.9 % IV SOLN
INTRAVENOUS | Status: AC
  Filled 2019-05-29: qty 1.2

## 2019-05-29 MED ORDER — PROPOFOL 500 MG/50ML IV EMUL
INTRAVENOUS | Status: DC | PRN
  Administered 2019-05-29: 50 ug/kg/min via INTRAVENOUS

## 2019-05-29 MED ORDER — DEXAMETHASONE SODIUM PHOSPHATE 10 MG/ML IJ SOLN
INTRAMUSCULAR | Status: AC
  Filled 2019-05-29: qty 1

## 2019-05-29 MED ORDER — HEPARIN SODIUM (PORCINE) 1000 UNIT/ML IJ SOLN
INTRAMUSCULAR | Status: AC
  Filled 2019-05-29: qty 1

## 2019-05-29 MED ORDER — PHENYLEPHRINE 40 MCG/ML (10ML) SYRINGE FOR IV PUSH (FOR BLOOD PRESSURE SUPPORT)
PREFILLED_SYRINGE | INTRAVENOUS | Status: AC
  Filled 2019-05-29: qty 10

## 2019-05-29 MED ORDER — SODIUM CHLORIDE 0.9 % IV SOLN
INTRAVENOUS | Status: DC | PRN
  Administered 2019-05-29: 500 mL

## 2019-05-29 MED ORDER — HEMOSTATIC AGENTS (NO CHARGE) OPTIME
TOPICAL | Status: DC | PRN
  Administered 2019-05-29: 1 via TOPICAL

## 2019-05-29 MED ORDER — CEFAZOLIN SODIUM-DEXTROSE 2-4 GM/100ML-% IV SOLN
2.0000 g | INTRAVENOUS | Status: AC
  Administered 2019-05-29: 08:00:00 2 g via INTRAVENOUS
  Filled 2019-05-29: qty 100

## 2019-05-29 MED ORDER — SODIUM CHLORIDE 0.9 % IV SOLN
INTRAVENOUS | Status: DC | PRN
  Administered 2019-05-29: 07:00:00 via INTRAVENOUS

## 2019-05-29 MED ORDER — 0.9 % SODIUM CHLORIDE (POUR BTL) OPTIME
TOPICAL | Status: DC | PRN
  Administered 2019-05-29: 08:00:00 1000 mL

## 2019-05-29 MED ORDER — DEXAMETHASONE SODIUM PHOSPHATE 10 MG/ML IJ SOLN
INTRAMUSCULAR | Status: DC | PRN
  Administered 2019-05-29: 5 mg via INTRAVENOUS

## 2019-05-29 MED ORDER — PROTAMINE SULFATE 10 MG/ML IV SOLN
INTRAVENOUS | Status: AC
  Filled 2019-05-29: qty 5

## 2019-05-29 MED ORDER — LIDOCAINE-EPINEPHRINE (PF) 1 %-1:200000 IJ SOLN
INTRAMUSCULAR | Status: DC | PRN
  Administered 2019-05-29: 30 mL

## 2019-05-29 MED ORDER — FENTANYL CITRATE (PF) 100 MCG/2ML IJ SOLN
INTRAMUSCULAR | Status: DC | PRN
  Administered 2019-05-29: 25 ug via INTRAVENOUS
  Administered 2019-05-29: 50 ug via INTRAVENOUS

## 2019-05-29 MED ORDER — PROPOFOL 10 MG/ML IV BOLUS
INTRAVENOUS | Status: DC | PRN
  Administered 2019-05-29 (×2): 20 mg via INTRAVENOUS

## 2019-05-29 MED ORDER — FENTANYL CITRATE (PF) 250 MCG/5ML IJ SOLN
INTRAMUSCULAR | Status: AC
  Filled 2019-05-29: qty 5

## 2019-05-29 MED ORDER — LIDOCAINE HCL (PF) 1 % IJ SOLN
INTRAMUSCULAR | Status: AC
  Filled 2019-05-29: qty 30

## 2019-05-29 MED ORDER — LIDOCAINE-EPINEPHRINE (PF) 1 %-1:200000 IJ SOLN
INTRAMUSCULAR | Status: AC
  Filled 2019-05-29: qty 30

## 2019-05-29 MED ORDER — ONDANSETRON HCL 4 MG/2ML IJ SOLN
INTRAMUSCULAR | Status: AC
  Filled 2019-05-29: qty 2

## 2019-05-29 MED ORDER — LIDOCAINE 2% (20 MG/ML) 5 ML SYRINGE
INTRAMUSCULAR | Status: AC
  Filled 2019-05-29: qty 5

## 2019-05-29 MED ORDER — ONDANSETRON HCL 4 MG/2ML IJ SOLN
INTRAMUSCULAR | Status: DC | PRN
  Administered 2019-05-29: 4 mg via INTRAVENOUS

## 2019-05-29 MED ORDER — PROTAMINE SULFATE 10 MG/ML IV SOLN
INTRAVENOUS | Status: DC | PRN
  Administered 2019-05-29: 25 mg via INTRAVENOUS

## 2019-05-29 MED ORDER — PHENYLEPHRINE 40 MCG/ML (10ML) SYRINGE FOR IV PUSH (FOR BLOOD PRESSURE SUPPORT)
PREFILLED_SYRINGE | INTRAVENOUS | Status: DC | PRN
  Administered 2019-05-29: 120 ug via INTRAVENOUS
  Administered 2019-05-29 (×2): 80 ug via INTRAVENOUS
  Administered 2019-05-29: 40 ug via INTRAVENOUS
  Administered 2019-05-29: 80 ug via INTRAVENOUS

## 2019-05-29 MED ORDER — ACETAMINOPHEN 500 MG PO TABS
1000.0000 mg | ORAL_TABLET | Freq: Once | ORAL | Status: AC
  Administered 2019-05-29: 06:00:00 1000 mg via ORAL
  Filled 2019-05-29: qty 2

## 2019-05-29 MED ORDER — LIDOCAINE 2% (20 MG/ML) 5 ML SYRINGE
INTRAMUSCULAR | Status: DC | PRN
  Administered 2019-05-29: 50 mg via INTRAVENOUS

## 2019-05-29 MED ORDER — SODIUM CHLORIDE 0.9 % IV SOLN
INTRAVENOUS | Status: DC | PRN
  Administered 2019-05-29: 30 ug/min via INTRAVENOUS

## 2019-05-29 MED ORDER — SODIUM CHLORIDE 0.9 % IV SOLN: INTRAVENOUS | Status: DC

## 2019-05-29 SURGICAL SUPPLY — 31 items
ADH SKN CLS APL DERMABOND .7 (GAUZE/BANDAGES/DRESSINGS) ×1
ARMBAND PINK RESTRICT EXTREMIT (MISCELLANEOUS) ×4 IMPLANT
CANISTER SUCT 3000ML PPV (MISCELLANEOUS) ×3 IMPLANT
CLIP VESOCCLUDE MED 6/CT (CLIP) ×3 IMPLANT
CLIP VESOCCLUDE SM WIDE 6/CT (CLIP) ×3 IMPLANT
COVER PROBE W GEL 5X96 (DRAPES) ×3 IMPLANT
COVER WAND RF STERILE (DRAPES) ×3 IMPLANT
DERMABOND ADVANCED (GAUZE/BANDAGES/DRESSINGS) ×2
DERMABOND ADVANCED .7 DNX12 (GAUZE/BANDAGES/DRESSINGS) ×1 IMPLANT
ELECT REM PT RETURN 9FT ADLT (ELECTROSURGICAL) ×3
ELECTRODE REM PT RTRN 9FT ADLT (ELECTROSURGICAL) ×1 IMPLANT
GLOVE BIOGEL PI IND STRL 7.5 (GLOVE) ×1 IMPLANT
GLOVE BIOGEL PI INDICATOR 7.5 (GLOVE) ×2
GLOVE SURG SS PI 7.5 STRL IVOR (GLOVE) ×3 IMPLANT
GOWN STRL REUS W/ TWL LRG LVL3 (GOWN DISPOSABLE) ×2 IMPLANT
GOWN STRL REUS W/ TWL XL LVL3 (GOWN DISPOSABLE) ×1 IMPLANT
GOWN STRL REUS W/TWL LRG LVL3 (GOWN DISPOSABLE) ×6
GOWN STRL REUS W/TWL XL LVL3 (GOWN DISPOSABLE) ×3
HEMOSTAT SNOW SURGICEL 2X4 (HEMOSTASIS) ×2 IMPLANT
KIT BASIN OR (CUSTOM PROCEDURE TRAY) ×3 IMPLANT
KIT TURNOVER KIT B (KITS) ×3 IMPLANT
NS IRRIG 1000ML POUR BTL (IV SOLUTION) ×3 IMPLANT
PACK CV ACCESS (CUSTOM PROCEDURE TRAY) ×3 IMPLANT
PAD ARMBOARD 7.5X6 YLW CONV (MISCELLANEOUS) ×6 IMPLANT
SUT PROLENE 6 0 CC (SUTURE) ×3 IMPLANT
SUT VIC AB 3-0 SH 27 (SUTURE) ×3
SUT VIC AB 3-0 SH 27X BRD (SUTURE) ×1 IMPLANT
SUT VICRYL 4-0 PS2 18IN ABS (SUTURE) ×1 IMPLANT
TOWEL GREEN STERILE (TOWEL DISPOSABLE) ×3 IMPLANT
UNDERPAD 30X30 (UNDERPADS AND DIAPERS) ×3 IMPLANT
WATER STERILE IRR 1000ML POUR (IV SOLUTION) ×3 IMPLANT

## 2019-05-29 NOTE — Discharge Instructions (Signed)
° °  Vascular and Vein Specialists of [facility]  Discharge Instructions  AV Fistula or Graft Surgery for Dialysis Access  Please refer to the following instructions for your post-procedure care. Your surgeon or physician assistant will discuss any changes with you.  Activity  You may drive the day following your surgery, if you are comfortable and no longer taking prescription pain medication. Resume full activity as the soreness in your incision resolves.  Bathing/Showering  You may shower after you go home. Keep your incision dry for 48 hours. Do not soak in a bathtub, hot tub, or swim until the incision heals completely. You may not shower if you have a hemodialysis catheter.  Incision Care  Clean your incision with mild soap and water after 48 hours. Pat the area dry with a clean towel. You do not need a bandage unless otherwise instructed. Do not apply any ointments or creams to your incision. You may have skin glue on your incision. Do not peel it off. It will come off on its own in about one week. Your arm may swell a bit after surgery. To reduce swelling use pillows to elevate your arm so it is above your heart. Your doctor will tell you if you need to lightly wrap your arm with an ACE bandage.  Diet  Resume your normal diet. There are not special food restrictions following this procedure. In order to heal from your surgery, it is CRITICAL to get adequate nutrition. Your body requires vitamins, minerals, and protein. Vegetables are the best source of vitamins and minerals. Vegetables also provide the perfect balance of protein. Processed food has little nutritional value, so try to avoid this.  Medications  Resume taking all of your medications. If your incision is causing pain, you may take over-the counter pain relievers such as acetaminophen (Tylenol). If you were prescribed a stronger pain medication, please be aware these medications can cause nausea and constipation. Prevent  nausea by taking the medication with a snack or meal. Avoid constipation by drinking plenty of fluids and eating foods with high amount of fiber, such as fruits, vegetables, and grains.  Do not take Tylenol if you are taking prescription pain medications.  Follow up Your surgeon may want to see you in the office following your access surgery. If so, this will be arranged at the time of your surgery.  Please call us immediately for any of the following conditions:  Increased pain, redness, drainage (pus) from your incision site Fever of 101 degrees or higher Severe or worsening pain at your incision site Hand pain or numbness.  Reduce your risk of vascular disease:  Stop smoking. If you would like help, call [facility] at 1-800-QUIT-NOW 262 833 8669) or [place] [facility] [place] your cholesterol Maintain a desired weight Control your diabetes Keep your blood pressure down  Dialysis  It will take several weeks to several months for your new dialysis access to be ready for use. Your surgeon will determine when it is okay to use it. Your nephrologist will continue to direct your dialysis. You can continue to use your Permcath until your new access is ready for use.   05/29/2019 Latoya Lopez [id number] [birth year]  Surgeon(s): Serafina Mitchell, MD  Procedure(s): Creation of left 1st stage basilic vein transposition  x Do not stick fistula for 12 weeks    If you have any questions, please call the office at [phone].

## 2019-05-29 NOTE — Interval H&P Note (Signed)
History and Physical Interval Note:  05/29/2019 7:59 AM  Latoya Lopez  has presented today for surgery, with the diagnosis of end stage renal disease.  The various methods of treatment have been discussed with the patient and family. After consideration of risks, benefits and other options for treatment, the patient has consented to  Procedure(s): ARTERIOVENOUS (AV) FISTULA CREATION LEFT ARM (Left) as a surgical intervention.  [name] history has been reviewed, patient examined, no change in status, stable for surgery.  I have reviewed the patient's chart and labs.  Questions were answered to the patient's satisfaction.     Annamarie Major

## 2019-05-29 NOTE — Transfer of Care (Signed)
Immediate Anesthesia Transfer of Care Note  Patient: Latoya Lopez  Procedure(s) Performed: ARTERIOVENOUS (AV) FISTULA CREATION LEFT ARM (Left Arm Upper)  Patient Location: PACU  Anesthesia Type:MAC  Level of Consciousness: drowsy  Airway & Oxygen Therapy: Patient Spontanous Breathing and Patient connected to face mask oxygen  Post-op Assessment: Report given to RN and Post -op Vital signs reviewed and stable  Post vital signs: Reviewed and stable  Last Vitals:  Vitals Value Taken Time  BP 133/69 05/29/19 0928  Temp    Pulse 90 05/29/19 0928  Resp 16 05/29/19 0928  SpO2 98 % 05/29/19 0928  Vitals shown include unvalidated device data.  Last Pain:  Vitals:   05/29/19 0617  TempSrc:   PainSc: 0-No pain      Patients Stated Pain Goal: 4 (75/88/32 5498)  Complications: No apparent anesthesia complications

## 2019-05-29 NOTE — Op Note (Signed)
    Patient name: Latoya Lopez MRN: [id number] DOB: 07-08-1941 Sex: female  05/29/2019 Pre-operative Diagnosis: ESRD Post-operative diagnosis:  Same Surgeon:  Annamarie Major Assistants:  Leontine Locket Procedure:   Left first stage basilic vein fistula creation Anesthesia: MAC  Blood Loss:  minimal Specimens:  none  Findings:  73m basilic vein with 337martery  Indications:  The patient has had right arm grafts that have occluded.  She now needs left arm access.  Procedure:  The [place]  The patient was then placed supine on the table. MAC anesthesia was administered.  The patient was prepped and draped in the usual sterile fashion.  A time out was called and antibiotics were administered.  U/s was used to evaluate her basilic vein and it appeared reasonable for fistula creation.  A obliques incision was made just proximal to the antecubital crease after anesthetizing the skin with 1% lidocaine.  I first dissected out the brachial artery.  Was a 3 mm disease-free artery.  It was encircled with Vesseloops.  Next explored the basilic vein.  Dissection appeared reasonable.  Side branches were ligated between silk ties.  The vein was fully mobilized throughout the length of the incision.  It was marked for orientation.  The vein was then ligated distally with 3-0 silk tie.  The vein distended nicely with heparin saline.  Next, 3000 units of heparin was administered.  The brachial artery was occluded with vascular clamps.  A #11 blade was used to make an arteriotomy which was extended longitudinally with Potts scissors.  The vein was then cut the appropriate length and spatulated for this at the arteriotomy.  A running anastomosis was created 6-0 Prolene.  Just prior to completion, the appropriate flushing maneuvers were performed and the anastomosis was completed.  The vein distended nicely once the clamps were released.  I inspected the  course of the vein to make sure there were no kinks.  There was a palpable thrill within the fistula as well as a palpable radial pulse.  Next, hemostasis was achieved.  Once this was satisfactory the incision was closed with 2 layers with 3-0 Vicryl followed by Dermabond.  There were no immediate complications.   Disposition: To PACU stable   V. WeAnnamarie MajorM.D., FAEvansville Surgery Center Gateway Campusascular and Vein Specialists of [place] Seminaryffice: [phone]:  [phone]

## 2019-05-29 NOTE — Anesthesia Procedure Notes (Signed)
Procedure Name: MAC Date/Time: 05/29/2019 8:11 AM Performed by: Colin Benton, CRNA Pre-anesthesia Checklist: Patient identified, Emergency Drugs available, Suction available and Patient being monitored Patient Re-evaluated:Patient Re-evaluated prior to induction Oxygen Delivery Method: Simple face mask Induction Type: IV induction Placement Confirmation: positive ETCO2 Dental Injury: Teeth and Oropharynx as per pre-operative assessment

## 2019-05-29 NOTE — Anesthesia Postprocedure Evaluation (Signed)
Anesthesia Post Note  Patient: Latoya Lopez  Procedure(s) Performed: ARTERIOVENOUS (AV) FISTULA CREATION LEFT ARM (Left Arm Upper)     Patient location during evaluation: PACU Anesthesia Type: MAC Level of consciousness: awake and alert Pain management: pain level controlled Vital Signs Assessment: post-procedure vital signs reviewed and stable Respiratory status: spontaneous breathing, nonlabored ventilation and respiratory function stable Cardiovascular status: stable and blood pressure returned to baseline Postop Assessment: no apparent nausea or vomiting Anesthetic complications: no    Last Vitals:  Vitals:   05/29/19 0945 05/29/19 1000  BP: 127/60 117/61  Pulse: 81 86  Resp: 16 18  Temp:  (!) 36.2 C  SpO2: 95% 95%    Last Pain:  Vitals:   05/29/19 1000  TempSrc:   PainSc: 0-No pain                 Vanetta Rule,W. EDMOND

## 2019-05-30 ENCOUNTER — Encounter (HOSPITAL_COMMUNITY): Payer: Self-pay | Admitting: Surgery

## 2019-07-02 IMAGING — CR DG CHEST 1V
1 series · 1 of 1 positions shown · non-contrast
Comparison: 08/17/2017

CLINICAL DATA: S/p TD see placement

EXAM:
CHEST  1 VIEW

[AP]
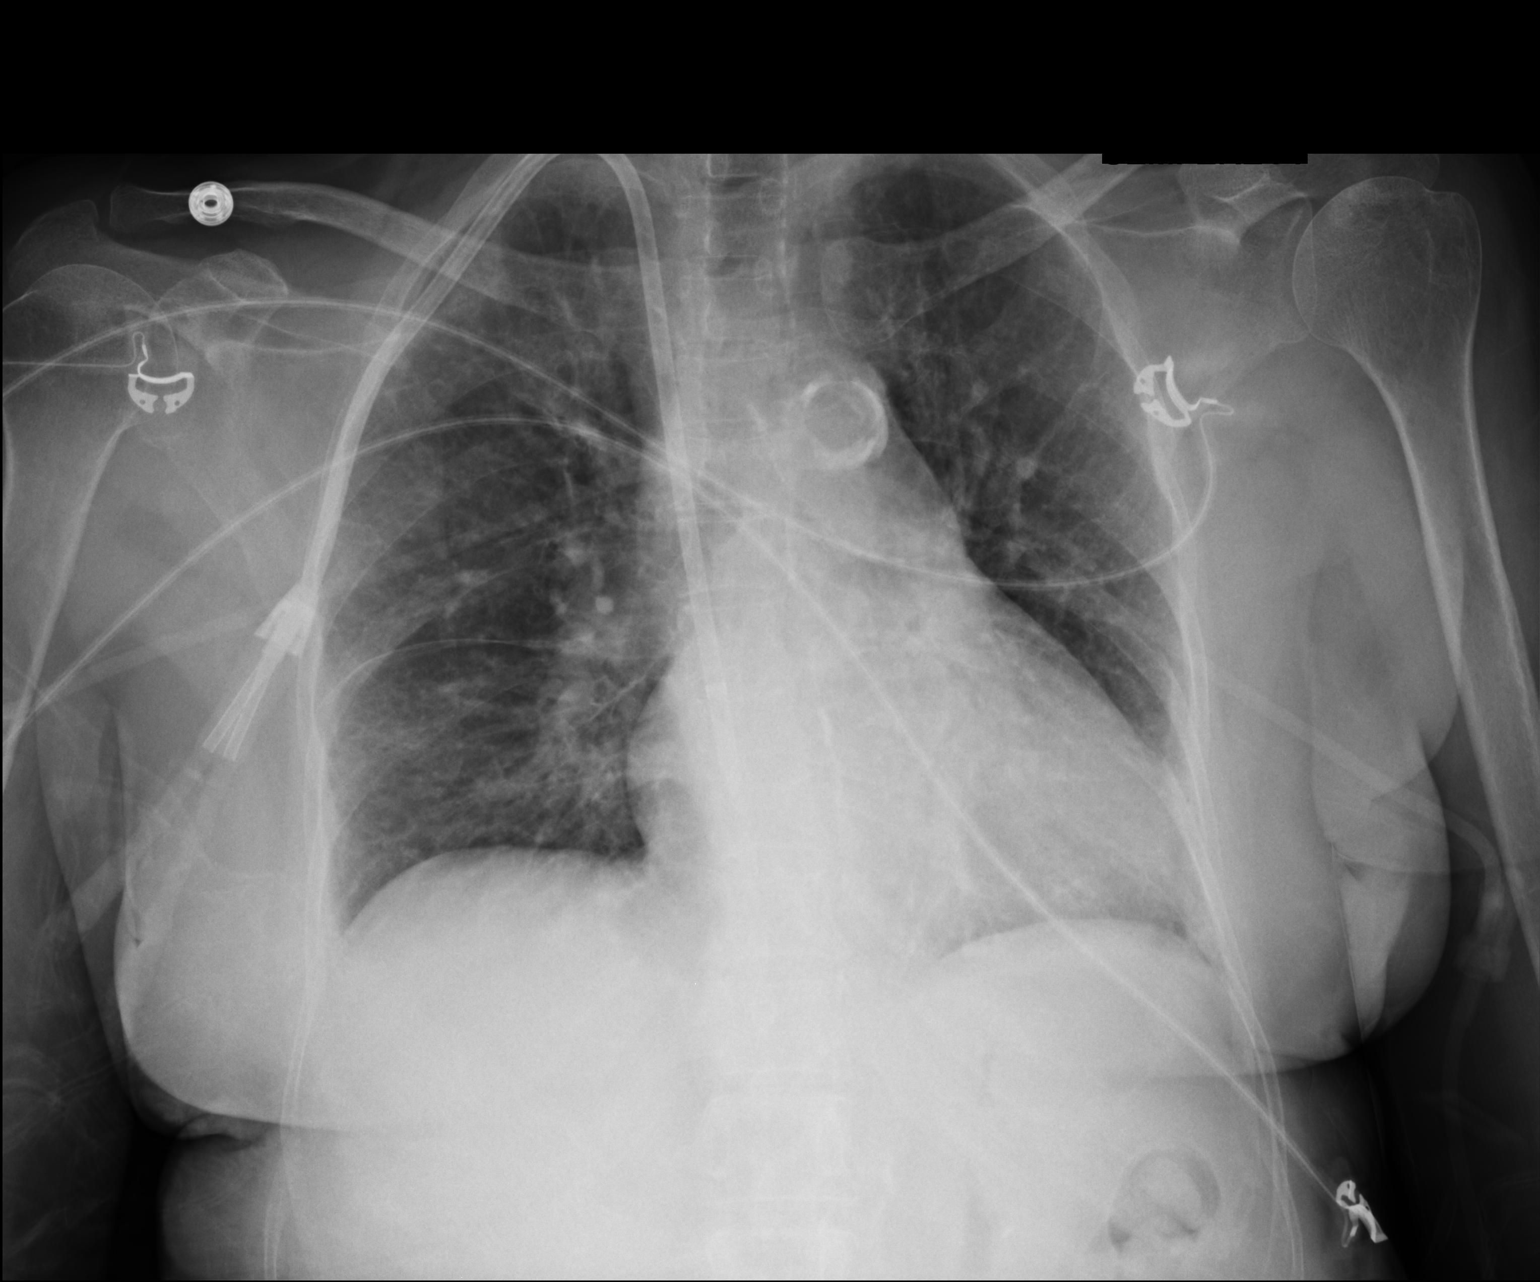

[1 of 1 positions shown; findings below may reference images not displayed]

FINDINGS: Dual lumen dialysis catheter placed from a right internal jugular
approach. Tip is in the right atrium. Mild cardiac enlargement.
Chronic aortic atherosclerosis. Pulmonary venous hypertension
without frank edema, infiltrate, collapse or effusion. Chronic
interstitial markings.
IMPRESSION: Catheter tip in the right atrium.  No pneumothorax.

## 2019-07-09 ENCOUNTER — Other Ambulatory Visit: Payer: Self-pay

## 2019-07-09 DIAGNOSIS — N186 End stage renal disease: Secondary | ICD-10-CM

## 2019-07-09 DIAGNOSIS — Z992 Dependence on renal dialysis: Secondary | ICD-10-CM

## 2019-07-10 ENCOUNTER — Telehealth (HOSPITAL_COMMUNITY): Payer: Self-pay

## 2019-07-10 NOTE — Telephone Encounter (Signed)
   Left voicemail re: COVID-19 instructions.                      

## 2019-07-13 ENCOUNTER — Encounter: Payer: Self-pay | Admitting: Surgery

## 2019-07-13 ENCOUNTER — Ambulatory Visit (INDEPENDENT_AMBULATORY_CARE_PROVIDER_SITE_OTHER): Payer: Self-pay | Admitting: Surgery

## 2019-07-13 ENCOUNTER — Other Ambulatory Visit: Payer: Self-pay

## 2019-07-13 ENCOUNTER — Other Ambulatory Visit: Payer: Self-pay | Admitting: *Deleted

## 2019-07-13 ENCOUNTER — Ambulatory Visit (HOSPITAL_COMMUNITY)
Admission: RE | Admit: 2019-07-13 | Discharge: 2019-07-13 | Disposition: A | Discharge status: Home / Self Care | Payer: Medicare Other | Source: Ambulatory Visit | Attending: Family | Admitting: Family

## 2019-07-13 ENCOUNTER — Encounter: Payer: Self-pay | Admitting: *Deleted

## 2019-07-13 VITALS — BP 177/85 | HR 89 | Temp 97.6°F | Resp 20 | Ht 69.0 in | Wt 155.0 lb

## 2019-07-13 DIAGNOSIS — N186 End stage renal disease: Secondary | ICD-10-CM | POA: Insufficient documentation

## 2019-07-13 DIAGNOSIS — Z992 Dependence on renal dialysis: Secondary | ICD-10-CM

## 2019-07-13 NOTE — H&P (View-Only) (Signed)
Patient name: Latoya Lopez MRN: [id number] DOB: [birth year] Sex: female  REASON FOR VISIT:    Post op  HISTORY OF PRESENT ILLNESS:   Latoya Lopez is a 78 y.o. female who is status post left for staged basilic vein fistula creation on 05/29/2019.  She is without symptoms of steal.  She is dialyzing through a left-sided catheter.  CURRENT MEDICATIONS:    Current Outpatient Medications  Medication Sig Dispense Refill   acetaminophen (TYLENOL) 325 MG tablet Take 325 mg by mouth every 6 (six) hours as needed for mild pain.     amLODipine (NORVASC) 10 MG tablet Take 10 mg by mouth at bedtime.      atorvastatin (LIPITOR) 80 MG tablet Take 80 mg by mouth daily with breakfast.      calcium acetate (PHOSLO) 667 MG capsule Take 2 capsules (1,334 mg total) by mouth 3 (three) times daily with meals. (Patient taking differently: Take 1,334 mg by mouth 2 (two) times a day. ) 90 capsule 0   carvedilol (COREG) 12.5 MG tablet Take 12.5 mg by mouth See admin instructions. Take 1 tablet (12.5mg ) by mouth in the evening ONLY on Tuesdays, Thursdays, & Saturdays. Take 1 tablet (12.5 mg) by mouth twice daily on Sundays, Mondays, Wednesdays, & Fridays.     levothyroxine (SYNTHROID, LEVOTHROID) 75 MCG tablet Take 75 mcg by mouth daily before breakfast.     lidocaine-prilocaine (EMLA) cream Apply 1 application topically See admin instructions. Apply small amount to access site 1 to 2 hours prior to dialysis.  12   loratadine (CLARITIN) 10 MG tablet Take 10 mg by mouth daily with breakfast.      Multiple Vitamin (MULTIVITAMIN WITH MINERALS) TABS tablet Take 1 tablet by mouth daily.     omeprazole (PRILOSEC) 20 MG capsule Take 20 mg by mouth daily before breakfast.      oxyCODONE (ROXICODONE) 5 MG immediate release tablet Take 1 tablet (5 mg total) by mouth every 6 (six) hours as needed. 6 tablet 0   raloxifene (EVISTA) 60 MG tablet Take 60 mg by mouth daily with  breakfast.      No current facility-administered medications for this visit.     REVIEW OF SYSTEMS:   [X] denotes positive finding, [ ] denotes negative finding Cardiac  Comments:  Chest pain or chest pressure:    Shortness of breath upon exertion:    Short of breath when lying flat:    Irregular heart rhythm:    Constitutional    Fever or chills:      PHYSICAL EXAM:   Vitals:   07/13/19 1527  BP: (!) 177/85  Pulse: 89  Resp: 20  Temp: 97.6 F (36.4 C)  SpO2: 96%  Weight: 155 lb (70.3 kg)  Height: 5\' 9" (1.753 m)    GENERAL: The patient is a well-nourished female, in no acute distress. The vital signs are documented above. CARDIOVASCULAR: There is a regular rate and rhythm. PULMONARY: Non-labored respirations Palpable thrill within fistula  STUDIES:   +------------+----------+-------------+----------+--------+  Prox UA         204         0.86         1.01              +------------+----------+-------------+----------+--------+  Mid UA          26 8         0.55         1.48              +------------+----------+-------------+----------+--------+  Dist UA         472         0.52         1.60              +------------+----------+-------------+----------+--------+  AC Fossa        766         0.49         1.28              +------------+----------+-------------+----------+--------+       Summary: Patent arteriovenous fistula.  Anechoic structure seen on medial aspect of distal upper arm measuring 1.0 x 2.4 cm. No blood flow seen within. Structure is adjacent to the basilic vein.   MEDICAL ISSUES:   The patient will be scheduled for a second stage left basilic vein fistula creation on 07/22/2019.  The details of the operation as well as risks and benefits were discussed with her.  All questions were answered.  Leia Alf, MD, FACS [facility] [phone] Pager [phone]

## 2019-07-13 NOTE — Progress Notes (Signed)
Patient name: ZANIA KALISZ MRN: [id number] DOB: Feb 16, 1941 Sex: female  REASON FOR VISIT:    Post op  HISTORY OF PRESENT ILLNESS:   SYMPHONIE SCHNEIDERMAN is a 78 y.o. female who is status post left for staged basilic vein fistula creation on 05/29/2019.  She is without symptoms of steal.  She is dialyzing through a left-sided catheter.  CURRENT MEDICATIONS:    Current Outpatient Medications  Medication Sig Dispense Refill   acetaminophen (TYLENOL) 325 MG tablet Take 325 mg by mouth every 6 (six) hours as needed for mild pain.     amLODipine (NORVASC) 10 MG tablet Take 10 mg by mouth at bedtime.      atorvastatin (LIPITOR) 80 MG tablet Take 80 mg by mouth daily with breakfast.      calcium acetate (PHOSLO) 667 MG capsule Take 2 capsules (1,334 mg total) by mouth 3 (three) times daily with meals. (Patient taking differently: Take 1,334 mg by mouth 2 (two) times a day. ) 90 capsule 0   carvedilol (COREG) 12.5 MG tablet Take 12.5 mg by mouth See admin instructions. Take 1 tablet (12.5mg ) by mouth in the evening ONLY on Tuesdays, Thursdays, & Saturdays. Take 1 tablet (12.5 mg) by mouth twice daily on Sundays, Mondays, Wednesdays, & Fridays.     levothyroxine (SYNTHROID, LEVOTHROID) 75 MCG tablet Take 75 mcg by mouth daily before breakfast.     lidocaine-prilocaine (EMLA) cream Apply 1 application topically See admin instructions. Apply small amount to access site 1 to 2 hours prior to dialysis.  12   loratadine (CLARITIN) 10 MG tablet Take 10 mg by mouth daily with breakfast.      Multiple Vitamin (MULTIVITAMIN WITH MINERALS) TABS tablet Take 1 tablet by mouth daily.     omeprazole (PRILOSEC) 20 MG capsule Take 20 mg by mouth daily before breakfast.      oxyCODONE (ROXICODONE) 5 MG immediate release tablet Take 1 tablet (5 mg total) by mouth every 6 (six) hours as needed. 6 tablet 0   raloxifene (EVISTA) 60 MG tablet Take 60 mg by mouth daily with  breakfast.      No current facility-administered medications for this visit.     REVIEW OF SYSTEMS:   [X] denotes positive finding, [ ] denotes negative finding Cardiac  Comments:  Chest pain or chest pressure:    Shortness of breath upon exertion:    Short of breath when lying flat:    Irregular heart rhythm:    Constitutional    Fever or chills:      PHYSICAL EXAM:   Vitals:   07/13/19 1527  BP: (!) 177/85  Pulse: 89  Resp: 20  Temp: 97.6 F (36.4 C)  SpO2: 96%  Weight: 155 lb (70.3 kg)  Height: 5\' 9" (1.753 m)    GENERAL: The patient is a well-nourished female, in no acute distress. The vital signs are documented above. CARDIOVASCULAR: There is a regular rate and rhythm. PULMONARY: Non-labored respirations Palpable thrill within fistula  STUDIES:   +------------+----------+-------------+----------+--------+  Prox UA         204         0.86         1.01              +------------+----------+-------------+----------+--------+  Mid UA          26 8         0.55         1.48              +------------+----------+-------------+----------+--------+  Dist UA         472         0.52         1.60              +------------+----------+-------------+----------+--------+  AC Fossa        766         0.49         1.28              +------------+----------+-------------+----------+--------+       Summary: Patent arteriovenous fistula.  Anechoic structure seen on medial aspect of distal upper arm measuring 1.0 x 2.4 cm. No blood flow seen within. Structure is adjacent to the basilic vein.   MEDICAL ISSUES:   The patient will be scheduled for a second stage left basilic vein fistula creation on 07/22/2019.  The details of the operation as well as risks and benefits were discussed with her.  All questions were answered.  Leia Alf, MD, FACS [facility] [phone] Pager [phone]

## 2019-07-17 ENCOUNTER — Encounter (HOSPITAL_COMMUNITY): Payer: Self-pay | Admitting: *Deleted

## 2019-07-17 ENCOUNTER — Other Ambulatory Visit: Payer: Self-pay

## 2019-07-17 NOTE — Progress Notes (Signed)
Pt denies SOB, chest pain, and being under the care of a cardiologist. Pt denies having a cardiac cath and stated that a stress test was performed > 10 years ago. Pt stated that she is under the care of Garnetta Buddy, NP, PCP. Pt made aware to stop taking vitamins, fish oil and herbal medications. Do not take any NSAIDs ie: Ibuprofen, Advil, Naproxen (Aleve), Motrin, BC and Goody Powder. Pt denies that she and family members tested positive for COVID-19. Coronavirus Screening  Pt denies that she and family members experienced the following symptoms:  Cough yes/no: No Fever (>100.51F)  yes/no: No Runny nose yes/no: No Sore throat yes/no: No Difficulty breathing/shortness of breath  yes/no: No  Have you or a family member traveled in the last 14 days and where? yes/no: No  Pt reminded that hospital visitation restrictions are in effect and the importance of the restrictions.  Pt verbalized understanding of all pre-op instructions.

## 2019-07-21 ENCOUNTER — Other Ambulatory Visit (HOSPITAL_COMMUNITY)
Admission: RE | Admit: 2019-07-21 | Discharge: 2019-07-21 | Disposition: A | Discharge status: Home / Self Care | Payer: Medicare Other | Source: Ambulatory Visit | Attending: Surgery | Admitting: Surgery

## 2019-07-21 DIAGNOSIS — Z20828 Contact with and (suspected) exposure to other viral communicable diseases: Secondary | ICD-10-CM | POA: Diagnosis not present

## 2019-07-21 DIAGNOSIS — Z01812 Encounter for preprocedural laboratory examination: Secondary | ICD-10-CM | POA: Insufficient documentation

## 2019-07-21 LAB — SARS CORONAVIRUS 2 (TAT 6-24 HRS): SARS Coronavirus 2: NEGATIVE

## 2019-07-21 MED ORDER — CEFAZOLIN SODIUM-DEXTROSE 2-4 GM/100ML-% IV SOLN
2.0000 g | INTRAVENOUS | Status: AC
  Administered 2019-07-22: 2 g via INTRAVENOUS
  Filled 2019-07-21: qty 100

## 2019-07-22 ENCOUNTER — Ambulatory Visit (HOSPITAL_COMMUNITY)
Admission: RE | Admit: 2019-07-22 | Discharge: 2019-07-22 | Disposition: A | Discharge status: Home / Self Care | Payer: Medicare Other | Attending: Surgery | Admitting: Surgery

## 2019-07-22 ENCOUNTER — Other Ambulatory Visit: Payer: Self-pay

## 2019-07-22 ENCOUNTER — Encounter (HOSPITAL_COMMUNITY): Payer: Self-pay | Admitting: Surgery

## 2019-07-22 ENCOUNTER — Ambulatory Visit (HOSPITAL_COMMUNITY): Payer: Medicare Other | Admitting: Anesthesiology

## 2019-07-22 ENCOUNTER — Encounter (HOSPITAL_COMMUNITY)
Admission: RE | Disposition: A | Discharge status: Home / Self Care | Payer: Self-pay | Source: Home / Self Care | Attending: Surgery

## 2019-07-22 DIAGNOSIS — K219 Gastro-esophageal reflux disease without esophagitis: Secondary | ICD-10-CM | POA: Insufficient documentation

## 2019-07-22 DIAGNOSIS — Z79899 Other long term (current) drug therapy: Secondary | ICD-10-CM | POA: Insufficient documentation

## 2019-07-22 DIAGNOSIS — Z87891 Personal history of nicotine dependence: Secondary | ICD-10-CM | POA: Diagnosis not present

## 2019-07-22 DIAGNOSIS — I132 Hypertensive heart and chronic kidney disease with heart failure and with stage 5 chronic kidney disease, or end stage renal disease: Secondary | ICD-10-CM | POA: Insufficient documentation

## 2019-07-22 DIAGNOSIS — Z85828 Personal history of other malignant neoplasm of skin: Secondary | ICD-10-CM | POA: Insufficient documentation

## 2019-07-22 DIAGNOSIS — N186 End stage renal disease: Secondary | ICD-10-CM | POA: Insufficient documentation

## 2019-07-22 DIAGNOSIS — Z992 Dependence on renal dialysis: Secondary | ICD-10-CM | POA: Insufficient documentation

## 2019-07-22 DIAGNOSIS — E039 Hypothyroidism, unspecified: Secondary | ICD-10-CM | POA: Diagnosis not present

## 2019-07-22 DIAGNOSIS — N2581 Secondary hyperparathyroidism of renal origin: Secondary | ICD-10-CM | POA: Insufficient documentation

## 2019-07-22 DIAGNOSIS — E785 Hyperlipidemia, unspecified: Secondary | ICD-10-CM | POA: Diagnosis not present

## 2019-07-22 DIAGNOSIS — N185 Chronic kidney disease, stage 5: Secondary | ICD-10-CM | POA: Diagnosis not present

## 2019-07-22 DIAGNOSIS — Z90710 Acquired absence of both cervix and uterus: Secondary | ICD-10-CM | POA: Diagnosis not present

## 2019-07-22 DIAGNOSIS — I509 Heart failure, unspecified: Secondary | ICD-10-CM | POA: Diagnosis not present

## 2019-07-22 DIAGNOSIS — Z8249 Family history of ischemic heart disease and other diseases of the circulatory system: Secondary | ICD-10-CM | POA: Diagnosis not present

## 2019-07-22 DIAGNOSIS — D631 Anemia in chronic kidney disease: Secondary | ICD-10-CM | POA: Insufficient documentation

## 2019-07-22 DIAGNOSIS — E875 Hyperkalemia: Secondary | ICD-10-CM | POA: Insufficient documentation

## 2019-07-22 DIAGNOSIS — Z823 Family history of stroke: Secondary | ICD-10-CM | POA: Diagnosis not present

## 2019-07-22 HISTORY — PX: BASCILIC VEIN TRANSPOSITION: SHX5742

## 2019-07-22 LAB — POCT I-STAT 4, (NA,K, GLUC, HGB,HCT)
Glucose, Bld: 103 mg/dL — ABNORMAL HIGH (ref 70–99)
HCT: 32 % — ABNORMAL LOW (ref 36.0–46.0)
Hemoglobin: 10.9 g/dL — ABNORMAL LOW (ref 12.0–15.0)
Potassium: 4.4 mmol/L (ref 3.5–5.1)
Sodium: 141 mmol/L (ref 135–145)

## 2019-07-22 SURGERY — TRANSPOSITION, VEIN, BASILIC
Anesthesia: General | Laterality: Left

## 2019-07-22 MED ORDER — HEMOSTATIC AGENTS (NO CHARGE) OPTIME
TOPICAL | Status: DC | PRN
  Administered 2019-07-22: 1 via TOPICAL

## 2019-07-22 MED ORDER — SODIUM CHLORIDE 0.9 % IV SOLN
INTRAVENOUS | Status: DC | PRN
  Administered 2019-07-22: 13:00:00 500 mL

## 2019-07-22 MED ORDER — LIDOCAINE HCL (CARDIAC) PF 100 MG/5ML IV SOSY
PREFILLED_SYRINGE | INTRAVENOUS | Status: DC | PRN
  Administered 2019-07-22: 60 mg via INTRAVENOUS

## 2019-07-22 MED ORDER — ONDANSETRON HCL 4 MG/2ML IJ SOLN
INTRAMUSCULAR | Status: AC
  Filled 2019-07-22: qty 2

## 2019-07-22 MED ORDER — SODIUM CHLORIDE 0.9 % IV SOLN
INTRAVENOUS | Status: DC | PRN
  Administered 2019-07-22: 12:00:00 via INTRAVENOUS

## 2019-07-22 MED ORDER — ONDANSETRON HCL 4 MG/2ML IJ SOLN
4.0000 mg | Freq: Four times a day (QID) | INTRAMUSCULAR | Status: AC | PRN
  Administered 2019-07-22: 4 mg via INTRAVENOUS

## 2019-07-22 MED ORDER — PROPOFOL 10 MG/ML IV BOLUS
INTRAVENOUS | Status: AC
  Filled 2019-07-22: qty 20

## 2019-07-22 MED ORDER — PROMETHAZINE HCL 25 MG/ML IJ SOLN
INTRAMUSCULAR | Status: AC
  Filled 2019-07-22: qty 1

## 2019-07-22 MED ORDER — PROPOFOL 10 MG/ML IV BOLUS
INTRAVENOUS | Status: DC | PRN
  Administered 2019-07-22: 130 mg via INTRAVENOUS

## 2019-07-22 MED ORDER — CHLORHEXIDINE GLUCONATE 4 % EX LIQD: 60.0000 mL | Freq: Once | CUTANEOUS | Status: DC

## 2019-07-22 MED ORDER — PROMETHAZINE HCL 25 MG/ML IJ SOLN
6.2500 mg | Freq: Four times a day (QID) | INTRAMUSCULAR | Status: DC | PRN
  Administered 2019-07-22: 6.25 mg via INTRAVENOUS

## 2019-07-22 MED ORDER — SODIUM CHLORIDE 0.9 % IV SOLN
INTRAVENOUS | Status: DC | PRN
  Administered 2019-07-22: 25 ug/min via INTRAVENOUS

## 2019-07-22 MED ORDER — FENTANYL CITRATE (PF) 250 MCG/5ML IJ SOLN
INTRAMUSCULAR | Status: AC
  Filled 2019-07-22: qty 5

## 2019-07-22 MED ORDER — OXYCODONE HCL 5 MG PO TABS
5.0000 mg | ORAL_TABLET | Freq: Once | ORAL | Status: AC | PRN
  Administered 2019-07-22: 5 mg via ORAL

## 2019-07-22 MED ORDER — FENTANYL CITRATE (PF) 250 MCG/5ML IJ SOLN
INTRAMUSCULAR | Status: DC | PRN
  Administered 2019-07-22: 50 ug via INTRAVENOUS
  Administered 2019-07-22 (×2): 25 ug via INTRAVENOUS

## 2019-07-22 MED ORDER — 0.9 % SODIUM CHLORIDE (POUR BTL) OPTIME
TOPICAL | Status: DC | PRN
  Administered 2019-07-22: 1000 mL

## 2019-07-22 MED ORDER — OXYCODONE HCL 5 MG PO TABS: 5.0000 mg | ORAL_TABLET | Freq: Once | ORAL | 0 refills | Status: DC | PRN

## 2019-07-22 MED ORDER — OXYCODONE HCL 5 MG/5ML PO SOLN: 5.0000 mg | Freq: Once | ORAL | Status: AC | PRN

## 2019-07-22 MED ORDER — FENTANYL CITRATE (PF) 100 MCG/2ML IJ SOLN: 25.0000 ug | INTRAMUSCULAR | Status: DC | PRN

## 2019-07-22 MED ORDER — PHENYLEPHRINE 40 MCG/ML (10ML) SYRINGE FOR IV PUSH (FOR BLOOD PRESSURE SUPPORT)
PREFILLED_SYRINGE | INTRAVENOUS | Status: AC
  Filled 2019-07-22: qty 10

## 2019-07-22 MED ORDER — ONDANSETRON HCL 4 MG/2ML IJ SOLN
INTRAMUSCULAR | Status: DC | PRN
  Administered 2019-07-22: 4 mg via INTRAVENOUS

## 2019-07-22 MED ORDER — LIDOCAINE-EPINEPHRINE (PF) 1 %-1:200000 IJ SOLN
INTRAMUSCULAR | Status: AC
  Filled 2019-07-22: qty 30

## 2019-07-22 MED ORDER — OXYCODONE HCL 5 MG PO TABS
ORAL_TABLET | ORAL | Status: AC
  Filled 2019-07-22: qty 1

## 2019-07-22 MED ORDER — SODIUM CHLORIDE 0.9 % IV SOLN
INTRAVENOUS | Status: AC
  Filled 2019-07-22: qty 1.2

## 2019-07-22 MED ORDER — SODIUM CHLORIDE 0.9 % IV SOLN: INTRAVENOUS | Status: DC

## 2019-07-22 MED ORDER — LIDOCAINE 2% (20 MG/ML) 5 ML SYRINGE
INTRAMUSCULAR | Status: AC
  Filled 2019-07-22: qty 5

## 2019-07-22 SURGICAL SUPPLY — 45 items
ADH SKN CLS APL DERMABOND .7 (GAUZE/BANDAGES/DRESSINGS) ×1
ARMBAND PINK RESTRICT EXTREMIT (MISCELLANEOUS) ×3 IMPLANT
CANISTER SUCT 3000ML PPV (MISCELLANEOUS) ×3 IMPLANT
CLIP VESOCCLUDE MED 24/CT (CLIP) ×2 IMPLANT
CLIP VESOCCLUDE MED 6/CT (CLIP) IMPLANT
CLIP VESOCCLUDE SM WIDE 24/CT (CLIP) ×2 IMPLANT
CLIP VESOCCLUDE SM WIDE 6/CT (CLIP) IMPLANT
COVER PROBE W GEL 5X96 (DRAPES) ×3 IMPLANT
COVER WAND RF STERILE (DRAPES) ×1 IMPLANT
DERMABOND ADVANCED (GAUZE/BANDAGES/DRESSINGS) ×2
DERMABOND ADVANCED .7 DNX12 (GAUZE/BANDAGES/DRESSINGS) ×1 IMPLANT
ELECT REM PT RETURN 9FT ADLT (ELECTROSURGICAL) ×3
ELECTRODE REM PT RTRN 9FT ADLT (ELECTROSURGICAL) ×1 IMPLANT
GLOVE BIOGEL PI IND STRL 6.5 (GLOVE) IMPLANT
GLOVE BIOGEL PI IND STRL 7.5 (GLOVE) ×1 IMPLANT
GLOVE BIOGEL PI IND STRL 8 (GLOVE) IMPLANT
GLOVE BIOGEL PI INDICATOR 6.5 (GLOVE) ×2
GLOVE BIOGEL PI INDICATOR 7.5 (GLOVE) ×4
GLOVE BIOGEL PI INDICATOR 8 (GLOVE) ×2
GLOVE ECLIPSE 6.5 STRL STRAW (GLOVE) ×2 IMPLANT
GLOVE ECLIPSE 7.0 STRL STRAW (GLOVE) ×2 IMPLANT
GLOVE ECLIPSE 8.0 STRL XLNG CF (GLOVE) ×2 IMPLANT
GLOVE SURG SS PI 7.5 STRL IVOR (GLOVE) ×3 IMPLANT
GOWN STRL REUS W/ TWL LRG LVL3 (GOWN DISPOSABLE) ×2 IMPLANT
GOWN STRL REUS W/ TWL XL LVL3 (GOWN DISPOSABLE) ×1 IMPLANT
GOWN STRL REUS W/TWL 2XL LVL3 (GOWN DISPOSABLE) ×2 IMPLANT
GOWN STRL REUS W/TWL LRG LVL3 (GOWN DISPOSABLE) ×6
GOWN STRL REUS W/TWL XL LVL3 (GOWN DISPOSABLE) ×3
HEMOSTAT SNOW SURGICEL 2X4 (HEMOSTASIS) ×2 IMPLANT
KIT BASIN OR (CUSTOM PROCEDURE TRAY) ×3 IMPLANT
KIT TURNOVER KIT B (KITS) ×3 IMPLANT
NS IRRIG 1000ML POUR BTL (IV SOLUTION) ×3 IMPLANT
PACK CV ACCESS (CUSTOM PROCEDURE TRAY) ×3 IMPLANT
PAD ARMBOARD 7.5X6 YLW CONV (MISCELLANEOUS) ×6 IMPLANT
SPONGE LAP 18X18 X RAY DECT (DISPOSABLE) ×2 IMPLANT
SUT PROLENE 6 0 BV (SUTURE) ×8 IMPLANT
SUT PROLENE 6 0 CC (SUTURE) ×1 IMPLANT
SUT SILK 2 0 SH (SUTURE) ×2 IMPLANT
SUT VIC AB 3-0 SH 27 (SUTURE) ×6
SUT VIC AB 3-0 SH 27X BRD (SUTURE) ×1 IMPLANT
SUT VIC AB 4-0 PS2 18 (SUTURE) ×2 IMPLANT
SUT VICRYL 4-0 PS2 18IN ABS (SUTURE) ×3 IMPLANT
TOWEL GREEN STERILE (TOWEL DISPOSABLE) ×3 IMPLANT
UNDERPAD 30X30 (UNDERPADS AND DIAPERS) ×3 IMPLANT
WATER STERILE IRR 1000ML POUR (IV SOLUTION) ×3 IMPLANT

## 2019-07-22 NOTE — Anesthesia Preprocedure Evaluation (Signed)
Anesthesia Evaluation  Patient identified by MRN, date of birth, ID band Patient awake    Reviewed: Allergy & Precautions, H&P , NPO status , Patient's Chart, lab work & pertinent test results  Airway Mallampati: II   Neck ROM: full    Dental   Pulmonary former smoker,    breath sounds clear to auscultation       Cardiovascular hypertension, +CHF   Rhythm:regular Rate:Normal     Neuro/Psych    GI/Hepatic GERD  ,  Endo/Other  Hypothyroidism   Renal/GU ESRF and DialysisRenal disease     Musculoskeletal   Abdominal   Peds  Hematology  (+) Blood dyscrasia, anemia ,   Anesthesia Other Findings   Reproductive/Obstetrics                             Anesthesia Physical Anesthesia Plan  ASA: IV  Anesthesia Plan: General   Post-op Pain Management:    Induction: Intravenous  PONV Risk Score and Plan: 3 and Ondansetron and Treatment may vary due to age or medical condition  Airway Management Planned: LMA  Additional Equipment:   Intra-op Plan:   Post-operative Plan:   Informed Consent: I have reviewed the patients History and Physical, chart, labs and discussed the procedure including the risks, benefits and alternatives for the proposed anesthesia with the patient or authorized representative who has indicated his/her understanding and acceptance.       Plan Discussed with: CRNA, Anesthesiologist and Surgeon  Anesthesia Plan Comments:         Anesthesia Quick Evaluation

## 2019-07-22 NOTE — Interval H&P Note (Signed)
History and Physical Interval Note:  07/22/2019 10:27 AM  Latoya Lopez  has presented today for surgery, with the diagnosis of END STAGE RENAL DISEASE FOR HEMODIALYSIS ACCESS.  The various methods of treatment have been discussed with the patient and family. After consideration of risks, benefits and other options for treatment, the patient has consented to  Procedure(s): SECOND STAGE BASILIC VEIN TRANSPOSITION LEFT ARM (Left) as a surgical intervention.  [name] history has been reviewed, patient examined, no change in status, stable for surgery.  I have reviewed the patient's chart and labs.  Questions were answered to the patient's satisfaction.     Annamarie Major

## 2019-07-22 NOTE — Transfer of Care (Signed)
Immediate Anesthesia Transfer of Care Note  Patient: Latoya Lopez  Procedure(s) Performed: SECOND STAGE BASILIC VEIN TRANSPOSITION LEFT ARM (Left )  Patient Location: PACU  Anesthesia Type:General  Level of Consciousness: awake and patient cooperative  Airway & Oxygen Therapy: Patient Spontanous Breathing  Post-op Assessment: Report given to RN  Post vital signs: Reviewed and stable  Last Vitals:  Vitals Value Taken Time  BP    Temp    Pulse 63 07/22/19 1430  Resp 12 07/22/19 1430  SpO2 94 % 07/22/19 1430  Vitals shown include unvalidated device data.  Last Pain:  Vitals:   07/22/19 1008  TempSrc:   PainSc: 0-No pain      Patients Stated Pain Goal: 4 (41/28/78 6767)  Complications: No apparent anesthesia complications

## 2019-07-22 NOTE — Discharge Instructions (Signed)
° °  [facility] [place] ° °Discharge Instructions ° °AV Fistula or Graft Surgery for Dialysis Access ° °Please refer to the following instructions for your post-procedure care. Your surgeon or physician assistant will discuss any changes with you. ° °Activity ° °You may drive the day following your surgery, if you are comfortable and no longer taking prescription pain medication. Resume full activity as the soreness in your incision resolves. ° °Bathing/Showering ° °You may shower after you go home. Keep your incision dry for 48 hours. Do not soak in a bathtub, hot tub, or swim until the incision heals completely. You may not shower if you have a hemodialysis catheter. ° °Incision Care ° °Clean your incision with mild soap and water after 48 hours. Pat the area dry with a clean towel. You do not need a bandage unless otherwise instructed. Do not apply any ointments or creams to your incision. You may have skin glue on your incision. Do not peel it off. It will come off on its own in about one week. Your arm may swell a bit after surgery. To reduce swelling use pillows to elevate your arm so it is above your heart. Your doctor will tell you if you need to lightly wrap your arm with an ACE bandage. ° °Diet ° °Resume your normal diet. There are not special food restrictions following this procedure. In order to heal from your surgery, it is CRITICAL to get adequate nutrition. Your body requires vitamins, minerals, and protein. Vegetables are the best source of vitamins and minerals. Vegetables also provide the perfect balance of protein. Processed food has little nutritional value, so try to avoid this. ° °Medications ° °Resume taking all of your medications. If your incision is causing pain, you may take over-the counter pain relievers such as acetaminophen (Tylenol). If you were prescribed a stronger pain medication, please be aware these medications can cause nausea and constipation. Prevent  nausea by taking the medication with a snack or meal. Avoid constipation by drinking plenty of fluids and eating foods with high amount of fiber, such as fruits, vegetables, and grains. Do not take Tylenol if you are taking prescription pain medications. ° ° ° ° °Follow up °Your surgeon may want to see you in the office following your access surgery. If so, this will be arranged at the time of your surgery. ° °Please call us immediately for any of the following conditions: ° °Increased pain, redness, drainage (pus) from your incision site °Fever of 101 degrees or higher °Severe or worsening pain at your incision site °Hand pain or numbness. ° °Reduce your risk of vascular disease: ° °Stop smoking. If you would like help, call [facility] at 1-800-QUIT-NOW (1-800-784-8669) or [place] at [phone] ° °Manage your cholesterol °Maintain a desired weight °Control your diabetes °Keep your blood pressure down ° °Dialysis ° °It will take several weeks to several months for your new dialysis access to be ready for use. Your surgeon will determine when it is OK to use it. Your nephrologist will continue to direct your dialysis. You can continue to use your Permcath until your new access is ready for use. ° °If you have any questions, please call the office at [phone]. ° °

## 2019-07-22 NOTE — Op Note (Signed)
    Patient name: Latoya Lopez MRN: [id number] DOB: Apr 19, 1941 Sex: female  07/22/2019 Pre-operative Diagnosis: End-stage renal disease Post-operative diagnosis:  Same Surgeon:  Annamarie Major Assistants: Laurence Slate Procedure:   Second stage left basilic vein fistula creation Anesthesia: General Blood Loss: 100 cc Specimens: None  Findings: 5-6 mm basilic vein  Indications: The patient has previously undergone left first-aid basilic vein fistula creation.  Her fistula has matured nicely and she is here for second stage creation.  Procedure:  The patient was identified in the holding area and taken to [place] [facility]  The patient was then placed supine on the table. general anesthesia was administered.  The patient was prepped and draped in the usual sterile fashion.  A time out was called and antibiotics were administered.  Ultrasound was used to evaluate the basilic vein in the upper arm.  It was of excellent caliber measuring 5 x 6 mm.  2 longitudinal incisions were made in the upper arm.  Through these incisions I exposed the basilic vein and fully mobilized it.  Side branches were ligated between silk ties.  The nerve was protected.  Once it was fully mobilized, it was marked with an ink pen for orientation.  I then created a subcutaneous tunnel with a Gore tunneler.  The basilic vein was then occluded in the axilla and at the antecubital crease with baby Gregory clamps.  The fistula was transected near the antecubital crease.  It was brought through the previously created tunnel making sure to maintain proper orientation.  A end to end anastomosis was then created with running 6-0 Prolene.  Prior to completion it was flushed appropriately.  The anastomosis was secured.  The clamps were released.  There was an excellent thrill within the fistula.  There were no kinks.  There was a good radial artery signal.  The wound was then irrigated.  Hemostasis was achieved.  The incisions were closed with  2 layers of Vicryl followed by Dermabond.  There were no immediate complications.   Disposition: To PACU stable.   Theotis Burrow, M.D., [facility] [place] Office: [phone] Pager:  [phone]

## 2019-07-22 NOTE — Anesthesia Procedure Notes (Signed)
Procedure Name: LMA Insertion Date/Time: 07/22/2019 12:37 PM Performed by: Barrington Ellison, CRNA Pre-anesthesia Checklist: Patient identified, Emergency Drugs available, Suction available and Patient being monitored Patient Re-evaluated:Patient Re-evaluated prior to induction Oxygen Delivery Method: Circle System Utilized Preoxygenation: Pre-oxygenation with 100% oxygen Induction Type: IV induction Ventilation: Mask ventilation without difficulty LMA: LMA inserted LMA Size: 4.0 Number of attempts: 1 Placement Confirmation: positive ETCO2 Tube secured with: Tape Dental Injury: Teeth and Oropharynx as per pre-operative assessment

## 2019-07-23 ENCOUNTER — Encounter (HOSPITAL_COMMUNITY): Payer: Self-pay | Admitting: Surgery

## 2019-07-25 NOTE — Anesthesia Postprocedure Evaluation (Signed)
Anesthesia Post Note  Patient: Latoya Lopez  Procedure(s) Performed: SECOND STAGE BASILIC VEIN TRANSPOSITION LEFT ARM (Left )     Patient location during evaluation: PACU Anesthesia Type: General Level of consciousness: awake and alert Pain management: pain level controlled Vital Signs Assessment: post-procedure vital signs reviewed and stable Respiratory status: spontaneous breathing, nonlabored ventilation, respiratory function stable and patient connected to nasal cannula oxygen Cardiovascular status: blood pressure returned to baseline and stable Postop Assessment: no apparent nausea or vomiting Anesthetic complications: no    Last Vitals:  Vitals:   07/22/19 1530 07/22/19 1545  BP: (!) 151/80 (!) 161/77  Pulse: 75 72  Resp: 19 13  Temp:    SpO2: 94% 96%    Last Pain:  Vitals:   07/22/19 1600  TempSrc:   PainSc: 2                  Iran Kievit S

## 2019-08-10 ENCOUNTER — Other Ambulatory Visit: Payer: Self-pay

## 2019-08-10 ENCOUNTER — Ambulatory Visit (INDEPENDENT_AMBULATORY_CARE_PROVIDER_SITE_OTHER): Payer: Self-pay | Admitting: Physician Assistant

## 2019-08-10 VITALS — BP 170/81 | HR 87 | Temp 98.2°F | Resp 14 | Ht 69.0 in | Wt 156.0 lb

## 2019-08-10 DIAGNOSIS — Z992 Dependence on renal dialysis: Secondary | ICD-10-CM

## 2019-08-10 DIAGNOSIS — N186 End stage renal disease: Secondary | ICD-10-CM

## 2019-08-10 NOTE — Progress Notes (Signed)
POST OPERATIVE OFFICE NOTE    CC:  F/u for surgery  HPI:  This is a 78 y.o. female who is s/p 2nd stage left BVT on 07/22/2019 by Dr. Trula Slade.  She previously underwent a 1st stage BVT on 05/29/2019 also by Dr. Trula Slade.  Her tunneled catheter was placed in November 2019 by Dr. Carlis Abbott.  She presents today for follow up.   She states that her arm became infected and was red.  She went to the ER in [place] and she was given keflex.  Her last does was yesterday.  She states that it has resolved.  She states that she still has some scabs on her incision and some bruising but it is better.  She denies any pain in her left hand.   She dialyzes T/T/S in [place].   No Known Allergies  Current Outpatient Medications  Medication Sig Dispense Refill  . acetaminophen (TYLENOL) 325 MG tablet Take 325 mg by mouth every 6 (six) hours as needed for mild pain.    Marland Kitchen amLODipine (NORVASC) 10 MG tablet Take 10 mg by mouth at bedtime.     Marland Kitchen atorvastatin (LIPITOR) 80 MG tablet Take 80 mg by mouth daily with breakfast.     . calcium acetate (PHOSLO) 667 MG capsule Take 2 capsules (1,334 mg total) by mouth 3 (three) times daily with meals. (Patient taking differently: Take 2,001 mg by mouth 2 (two) times a day. ) 90 capsule 0  . carvedilol (COREG) 12.5 MG tablet Take 12.5 mg by mouth See admin instructions. Take 1 tablet (12.5mg ) by mouth in the evening ONLY on Tuesdays, Thursdays, & Saturdays. Take 1 tablet (12.5 mg) by mouth twice daily on Sundays, Mondays, Wednesdays, & Fridays.    Marland Kitchen levothyroxine (SYNTHROID, LEVOTHROID) 75 MCG tablet Take 75 mcg by mouth daily before breakfast.    . lidocaine-prilocaine (EMLA) cream Apply 1 application topically See admin instructions. Apply small amount to access site 1 to 2 hours prior to dialysis.  12  . loratadine (CLARITIN) 10 MG tablet Take 10 mg by mouth daily with breakfast.     . Multiple Vitamin (MULTIVITAMIN WITH MINERALS) TABS tablet Take 1 tablet by mouth every  evening.     Marland Kitchen omeprazole (PRILOSEC) 20 MG capsule Take 20 mg by mouth daily before breakfast.     . oxyCODONE (OXY IR/ROXICODONE) 5 MG immediate release tablet Take 1 tablet (5 mg total) by mouth once as needed (for pain score of 1-4). 12 tablet 0  . raloxifene (EVISTA) 60 MG tablet Take 60 mg by mouth daily with breakfast.      No current facility-administered medications for this visit.      ROS:  See HPI  Physical Exam:  Today's Vitals   08/10/19 1505  BP: (!) 170/81  Pulse: 87  Resp: 14  Temp: 98.2 F (36.8 C)  TempSrc: Temporal  SpO2: 97%  Weight: 156 lb (70.8 kg)  Height: 5\' 9"  (1.753 m)   Body mass index is 23.04 kg/m.   Incision:  Healing nicely. Extremities:  Easily palpbable left radial and ulnar pulse; the fistula is easily palpable and has an excellent thrill.  There is some mild swelling around the fistula around the tunnel. in the upper arm.  Motor and sensation are in tact left hand.   Assessment/Plan:  This is a 78 y.o. female who is s/p: 2nd stage left BVT on 07/22/2019 by Dr. Trula Slade.  She previously underwent a 1st stage BVT on 05/29/2019 also by Dr.  Brabham.  Her tunneled catheter was placed in November 2019 by Dr. Carlis Abbott.  -pt's fistula has an excellent thrill and is easily palpable.  She does not have evidence of steal and has a palpable left radial and ulnar pulse.  -the fistula can be used starting 08/29/2019.  Once the fistula is working to the satisfaction of the dialysis center, she can be scheduled to have her catheter removed.   -pt finished up Keflex yesterday for infection in left arm.  Her arm today looks good.  There is some mild swelling along the route of the fistula most likely from tunneling.  This should improve over time.  -pt understands that she most likely will need interventions on this fistula at some point as they do not last forever and she understands.  -she will f/u as needed.    Leontine Locket, PA-C [facility]  [phone]  Clinic MD:  Trula Slade

## 2019-09-16 ENCOUNTER — Telehealth: Payer: Self-pay | Admitting: *Deleted

## 2019-09-16 NOTE — Telephone Encounter (Signed)
Call from [place] at [facility] unable to use patient's A/V fistula/clotted access left arm. Patient has a TDC but would like to get patient in this week to be evaluated. Appt given to see PA 09/17/2019 at 1500 after HD. She will notify patient of time and fax note to this office for chart.

## 2019-09-17 ENCOUNTER — Other Ambulatory Visit: Payer: Self-pay

## 2019-09-17 ENCOUNTER — Ambulatory Visit (HOSPITAL_COMMUNITY)
Admission: RE | Admit: 2019-09-17 | Discharge: 2019-09-17 | Disposition: A | Discharge status: Home / Self Care | Payer: Medicare Other | Source: Ambulatory Visit | Attending: Family | Admitting: Family

## 2019-09-17 ENCOUNTER — Ambulatory Visit (INDEPENDENT_AMBULATORY_CARE_PROVIDER_SITE_OTHER): Payer: Medicare Other | Admitting: Physician Assistant

## 2019-09-17 VITALS — BP 123/70 | HR 66 | Temp 98.0°F | Resp 12 | Ht 69.0 in | Wt 150.7 lb

## 2019-09-17 DIAGNOSIS — N186 End stage renal disease: Secondary | ICD-10-CM

## 2019-09-17 DIAGNOSIS — Z992 Dependence on renal dialysis: Secondary | ICD-10-CM | POA: Diagnosis present

## 2019-09-17 NOTE — Progress Notes (Signed)
[facility] [place] HISTORY AND PHYSICAL   History of Present Illness:  Patient is a 78 y.o. year old female who was last seen in our office on 08/10/2019 by Leontine Locket PA-C.    s/p: 2nd stage left BVT on 07/22/2019 by Dr. Trula Slade.  She previously underwent a 1st stage BVT on 05/29/2019 also by Dr. Trula Slade.  Her tunneled catheter was placed in November 2019 by Dr. Carlis Abbott.  -pt's fistula has an excellent thrill and is easily palpable.  She does not have evidence of steal and has a palpable left radial and ulnar pulse.  -the fistula can be used starting 08/29/2019.   She states she had HD yesterday using the left AV fistula and had sever pain with HD.  The next morning she could not feel the fistula working.  She is here today for fistula examination and recommendations. She denise pain, numbness and loss of motor in the left UE.  Past Medical History:  Diagnosis Date  . Anemia of renal disease   . Cancer ([place])    skin  . Chronic kidney disease    Stage IV  . GERD (gastroesophageal reflux disease)   . Heart murmur   . Hyperkalemia   . Hyperlipidemia   . Hyperphosphatemia   . Hypertension    Essential with goal blood pressure less than 130/80  . Hypokalemia   . Hypoparathyroidism ([place])   . Hypothyroidism   . Membranous glomerulonephritis   . Proteinuria   . S/P partial hysterectomy   . Seasonal allergies   . Secondary hyperparathyroidism, renal ([place])   . Wears glasses     Past Surgical History:  Procedure Laterality Date  . ABDOMINAL HYSTERECTOMY     PARTIAL  . AV FISTULA PLACEMENT Right 08/06/2017   Procedure: ARTERIOVENOUS (AV) FISTULA CREATION RIGHT ARM;  Surgeon: Elam Dutch, MD;  Location: [facility] OR;  Service: Vascular;  Laterality: Right;  . AV FISTULA PLACEMENT Right 08/20/2017   Procedure: INSERTION OF ARTERIOVENOUS (AV) GORE-TEX STRETCH GRAFT INTO RIGHT ARM;  Surgeon: Elam Dutch, MD;  Location: [facility] OR;  Service: Vascular;  Laterality:  Right;  . AV FISTULA PLACEMENT Left 05/29/2019   Procedure: ARTERIOVENOUS (AV) FISTULA CREATION LEFT ARM;  Surgeon: Serafina Mitchell, MD;  Location: [place];  Service: Vascular;  Laterality: Left;  . BASCILIC VEIN TRANSPOSITION Left 07/22/2019   Procedure: SECOND STAGE BASILIC VEIN TRANSPOSITION LEFT ARM;  Surgeon: Serafina Mitchell, MD;  Location: [place];  Service: Vascular;  Laterality: Left;  . INSERTION OF DIALYSIS CATHETER N/A 08/17/2017   Procedure: INSERTION OF DIALYSIS CATHETER;  Surgeon: Rosetta Posner, MD;  Location: [place];  Service: Vascular;  Laterality: N/A;  . INSERTION OF DIALYSIS CATHETER Right 09/21/2018   Procedure: INSERTION OF DIALYSIS CATHETER;  Surgeon: Marty Heck, MD;  Location: [place];  Service: Vascular;  Laterality: Right;  . REVISION OF ARTERIOVENOUS GORETEX GRAFT Right 07/07/2018   Procedure: REVISION OF ARTERIOVENOUS GORETEX GRAFT;  Surgeon: Elam Dutch, MD;  Location: [place];  Service: Vascular;  Laterality: Right;  . SKIN BIOPSY      SKIN CANCER 6 AREAS  . THROMBECTOMY AND REVISION OF ARTERIOVENTOUS (AV) GORETEX  GRAFT Right 09/17/2018   Procedure: THROMBECTOMY AND REVISION OF ARTERIOVENTOUS (AV) GORETEX  GRAFT;  Surgeon: Serafina Mitchell, MD;  Location: [place];  Service: Vascular;  Laterality: Right;  . TUBUALIGATION       Social History Social History   Tobacco Use  .  Smoking status: Former Smoker    Types: Cigarettes  . Smokeless tobacco: Never Used  . Tobacco comment: quit smoking cigarettes in 1997  Substance Use Topics  . Alcohol use: No  . Drug use: No    Family History Family History  Problem Relation Age of Onset  . Stroke Mother   . Heart attack Father   . Heart attack Sister     Allergies  No Known Allergies   Current Outpatient Medications  Medication Sig Dispense Refill  . acetaminophen (TYLENOL) 325 MG tablet Take 325 mg by mouth every 6 (six) hours as needed for mild pain.    Marland Kitchen amLODipine (NORVASC) 10 MG tablet Take 10 mg  by mouth at bedtime.     Marland Kitchen atorvastatin (LIPITOR) 80 MG tablet Take 80 mg by mouth daily with breakfast.     . calcium acetate (PHOSLO) 667 MG capsule Take 2 capsules (1,334 mg total) by mouth 3 (three) times daily with meals. (Patient taking differently: Take 2,001 mg by mouth 2 (two) times a day. ) 90 capsule 0  . carvedilol (COREG) 12.5 MG tablet Take 12.5 mg by mouth See admin instructions. Take 1 tablet (12.5mg ) by mouth in the evening ONLY on Tuesdays, Thursdays, & Saturdays. Take 1 tablet (12.5 mg) by mouth twice daily on Sundays, Mondays, Wednesdays, & Fridays.    Marland Kitchen levothyroxine (SYNTHROID, LEVOTHROID) 75 MCG tablet Take 75 mcg by mouth daily before breakfast.    . lidocaine-prilocaine (EMLA) cream Apply 1 application topically See admin instructions. Apply small amount to access site 1 to 2 hours prior to dialysis.  12  . loratadine (CLARITIN) 10 MG tablet Take 10 mg by mouth daily with breakfast.     . Multiple Vitamin (MULTIVITAMIN WITH MINERALS) TABS tablet Take 1 tablet by mouth every evening.     Marland Kitchen omeprazole (PRILOSEC) 20 MG capsule Take 20 mg by mouth daily before breakfast.     . oxyCODONE (OXY IR/ROXICODONE) 5 MG immediate release tablet Take 1 tablet (5 mg total) by mouth once as needed (for pain score of 1-4). 12 tablet 0  . raloxifene (EVISTA) 60 MG tablet Take 60 mg by mouth daily with breakfast.      No current facility-administered medications for this visit.     ROS:   General:  No weight loss, Fever, chills  HEENT: No recent headaches, no nasal bleeding, no visual changes, no sore throat  Neurologic: No dizziness, blackouts, seizures. No recent symptoms of stroke or mini- stroke. No recent episodes of slurred speech, or temporary blindness.  Cardiac: No recent episodes of chest pain/pressure, no shortness of breath at rest.  No shortness of breath with exertion.  Denies history of atrial fibrillation or irregular heartbeat  Vascular: No history of rest pain in  feet.  No history of claudication.  No history of non-healing ulcer, No history of DVT   Pulmonary: No home oxygen, no productive cough, no hemoptysis,  No asthma or wheezing  Musculoskeletal:  [ ]  Arthritis, [ ]  Low back pain,  [ ]  Joint pain  Hematologic:No history of hypercoagulable state.  No history of easy bleeding.  No history of anemia  Gastrointestinal: No hematochezia or melena,  No gastroesophageal reflux, no trouble swallowing  Urinary: [ ]  chronic Kidney disease, [x ] on HD - [ ]  MWF or [ ]  TTHS, [ ]  Burning with urination, [ ]  Frequent urination, [ ]  Difficulty urinating;   Skin: No rashes  Psychological: No history of anxiety,  No  history of depression   Physical Examination  There were no vitals filed for this visit.  There is no height or weight on file to calculate BMI.  General:  Alert and oriented, no acute distress HEENT: Normal Neck: No bruit or JVD Pulmonary: Clear to auscultation bilaterally Cardiac: Regular Rate and Rhythm without murmur Gastrointestinal: Soft, non-tender, non-distended, no mass, no scars Skin: No rash Extremity Pulses:  2+ radial, brachial pulses bilaterally, no palpable thrill in left basilic fistula Musculoskeletal: No deformity or edema  Neurologic: Upper and lower extremity motor 5/5 and symmetric  DATA:    Findings: +--------------------+----------+-----------------+-----------+ AVF                 PSV (cm/s)Flow Vol (mL/min) Comments   +--------------------+----------+-----------------+-----------+ Native artery inflow    74                                 +--------------------+----------+-----------------+-----------+ AVF Anastomosis         47                     Prestenotic +--------------------+----------+-----------------+-----------+    +------------+----------+-------------+----------+--------+ OUTFLOW VEINPSV (cm/s)Diameter (cm)Depth  (cm)Describe +------------+----------+-------------+----------+--------+ Prox UA                                      occluded +------------+----------+-------------+----------+--------+ Mid UA                                       occluded +------------+----------+-------------+----------+--------+ Dist UA                                      occluded +------------+----------+-------------+----------+--------+       Summary: No color flow or Dopplet sigmal noted throughout suggestive of occluded left Basilic vein transposition fistula.     ASSESSMENT:  ESRD Occluded left AV fistula the fistula was created 05/29/2019 and second stage 07/22/2019.  It has now occluded after HD use on 11/ 01/2019.  PLAN: We recommend thrombolysis of the graft.  IR or CK vascular.  If the fistula is not salvageable then she will likely need a left UE graft.  I called Her Nephrology office and they will schedule her for lysis.  She will f/u with [place] as needed.  Roxy Horseman PA-C [facility] [place] Office: [phone]  MD in clinic [place]

## 2019-11-12 ENCOUNTER — Other Ambulatory Visit: Payer: Self-pay

## 2019-11-12 ENCOUNTER — Telehealth (HOSPITAL_COMMUNITY): Payer: Self-pay

## 2019-11-12 DIAGNOSIS — N186 End stage renal disease: Secondary | ICD-10-CM

## 2019-11-12 DIAGNOSIS — Z992 Dependence on renal dialysis: Secondary | ICD-10-CM

## 2019-11-12 NOTE — Telephone Encounter (Signed)
11/12/2019 10:59 am Called and spoke to her representative who informed me that she may be going into the hospital in [place] for pneumonia this weekend.  They are unsure if they will make Monday's appointment but will call.

## 2019-11-16 ENCOUNTER — Ambulatory Visit: Payer: Medicare Other | Admitting: Surgery

## 2019-11-16 ENCOUNTER — Encounter (HOSPITAL_COMMUNITY): Payer: Medicare Other

## 2019-11-16 ENCOUNTER — Inpatient Hospital Stay (HOSPITAL_COMMUNITY): Admission: RE | Admit: 2019-11-16 | Payer: Medicare Other | Source: Ambulatory Visit

## 2019-12-11 ENCOUNTER — Telehealth (HOSPITAL_COMMUNITY): Payer: Self-pay

## 2019-12-11 NOTE — Telephone Encounter (Signed)

## 2019-12-14 ENCOUNTER — Ambulatory Visit (INDEPENDENT_AMBULATORY_CARE_PROVIDER_SITE_OTHER): Payer: Medicare Other | Admitting: Surgery

## 2019-12-14 ENCOUNTER — Ambulatory Visit (HOSPITAL_COMMUNITY)
Admission: RE | Admit: 2019-12-14 | Discharge: 2019-12-14 | Disposition: A | Discharge status: Home / Self Care | Payer: Medicare Other | Source: Ambulatory Visit | Attending: Surgery | Admitting: Surgery

## 2019-12-14 ENCOUNTER — Other Ambulatory Visit: Payer: Self-pay

## 2019-12-14 ENCOUNTER — Encounter: Payer: Self-pay | Admitting: Surgery

## 2019-12-14 ENCOUNTER — Ambulatory Visit (INDEPENDENT_AMBULATORY_CARE_PROVIDER_SITE_OTHER)
Admission: RE | Admit: 2019-12-14 | Discharge: 2019-12-14 | Disposition: A | Discharge status: Home / Self Care | Payer: Medicare Other | Source: Ambulatory Visit | Attending: Surgery | Admitting: Surgery

## 2019-12-14 VITALS — BP 181/81 | HR 86 | Temp 97.9°F | Resp 20 | Ht 69.0 in | Wt 152.0 lb

## 2019-12-14 DIAGNOSIS — N186 End stage renal disease: Secondary | ICD-10-CM

## 2019-12-14 DIAGNOSIS — Z992 Dependence on renal dialysis: Secondary | ICD-10-CM | POA: Diagnosis present

## 2019-12-14 NOTE — H&P (View-Only) (Signed)
[facility] [place]  Patient name: Latoya Lopez MRN: [id number] DOB: 01/08/1941 Sex: female   REASON FOR VISIT:    Follow up  HISOTRY OF PRESENT ILLNESS:    Latoya Lopez is a 79 y.o. 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  Renal failure has been complicated by anemia and hyperparathyroidism.  She takes a statin for hypercholesterolemia.  She is medically managed for hypertension.  She is a former smoker.  She is ambidextrous.  08-06-2017:  R BCF 08-17-2017:  R IJ TDC 08-21-2107:  R UE AVGG 07-07-2018:  Revision  RUE [place] for ulcer 09-17-2018:  T&R R UE AVGG 09-21-2018:  R IJ TDC 05-29-2019: 1st Left BVT 07-22-2019:  2nd left BVT PAST MEDICAL HISTORY:   Past Medical History:  Diagnosis Date  . Anemia of renal disease   . Cancer ([place])    skin  . Chronic kidney disease    Stage IV  . GERD (gastroesophageal reflux disease)   . Heart murmur   . Hyperkalemia   . Hyperlipidemia   . Hyperphosphatemia   . Hypertension    Essential with goal blood pressure less than 130/80  . Hypokalemia   . Hypoparathyroidism ([place])   . Hypothyroidism   . Membranous glomerulonephritis   . Proteinuria   . S/P partial hysterectomy   . Seasonal allergies   . Secondary hyperparathyroidism, renal ([place])   . Wears glasses      FAMILY HISTORY:   Family History  Problem Relation Age of Onset  . Stroke Mother   . Heart attack Father   . Heart attack Sister     SOCIAL HISTORY:   Social History   Tobacco Use  . Smoking status: Former Smoker    Types: Cigarettes  . Smokeless tobacco: Never Used  . Tobacco comment: quit smoking cigarettes in 1997  Substance Use Topics  . Alcohol use: No     ALLERGIES:   No Known Allergies   CURRENT MEDICATIONS:   Current Outpatient Medications  Medication Sig Dispense Refill  . acetaminophen (TYLENOL) 325 MG tablet Take 325 mg by mouth every 6  (six) hours as needed for mild pain.    Marland Kitchen amLODipine (NORVASC) 10 MG tablet Take 10 mg by mouth at bedtime.     Marland Kitchen atorvastatin (LIPITOR) 80 MG tablet Take 80 mg by mouth daily with breakfast.     . calcium acetate (PHOSLO) 667 MG capsule Take 2 capsules (1,334 mg total) by mouth 3 (three) times daily with meals. (Patient taking differently: Take 2,001 mg by mouth 2 (two) times a day. ) 90 capsule 0  . carvedilol (COREG) 12.5 MG tablet Take 12.5 mg by mouth See admin instructions. Take 1 tablet (12.5mg ) by mouth in the evening ONLY on Tuesdays, Thursdays, & Saturdays. Take 1 tablet (12.5 mg) by mouth twice daily on Sundays, Mondays, Wednesdays, & Fridays.    Marland Kitchen levothyroxine (SYNTHROID, LEVOTHROID) 75 MCG tablet Take 75 mcg by mouth daily before breakfast.    . lidocaine-prilocaine (EMLA) cream Apply 1 application topically See admin instructions. Apply small amount to access site 1 to 2 hours prior to dialysis.  12  . loratadine (CLARITIN) 10 MG tablet Take 10 mg by mouth daily with breakfast.     . Multiple Vitamin (MULTIVITAMIN WITH MINERALS) TABS tablet Take 1 tablet by mouth every evening.     Marland Kitchen omeprazole (PRILOSEC) 20 MG capsule Take 20 mg by mouth daily before breakfast.     .  raloxifene (EVISTA) 60 MG tablet Take 60 mg by mouth daily with breakfast.      No current facility-administered medications for this visit.    REVIEW OF SYSTEMS:   [X]  denotes positive finding, [ ]  denotes negative finding Cardiac  Comments:  Chest pain or chest pressure:    Shortness of breath upon exertion:    Short of breath when lying flat:    Irregular heart rhythm:        Vascular    Pain in calf, thigh, or hip brought on by ambulation:    Pain in feet at night that wakes you up from your sleep:     Blood clot in your veins:    Leg swelling:         Pulmonary    Oxygen at home:    Productive cough:     Wheezing:         Neurologic    Sudden weakness in arms or legs:     Sudden numbness in arms  or legs:     Sudden onset of difficulty speaking or slurred speech:    Temporary loss of vision in one eye:     Problems with dizziness:         Gastrointestinal    Blood in stool:     Vomited blood:         Genitourinary    Burning when urinating:     Blood in urine:        Psychiatric    Major depression:         Hematologic    Bleeding problems:    Problems with blood clotting too easily:        Skin    Rashes or ulcers:        Constitutional    Fever or chills:      PHYSICAL EXAM:   There were no vitals filed for this visit.  GENERAL: The patient is a well-nourished female, in no acute distress. The vital signs are documented above. CARDIAC: There is a regular rate and rhythm.  VASCULAR: Palpable left brachial and radial pulse PULMONARY: Non-labored respirations MUSCULOSKELETAL: There are no major deformities or cyanosis. NEUROLOGIC: No focal weakness or paresthesias are detected. SKIN: There are no ulcers or rashes noted. PSYCHIATRIC: The patient has a normal affect.  STUDIES:   I have ordered and reviewed the following vascular studies: Arterial: Left: No obstruction visualized in the left upper extremity.   Venous: +-----------------+-------------+----------+--------+  Left Cephalic  Diameter (cm)Depth (cm)Findings  +-----------------+-------------+----------+--------+  Shoulder       0.21              +-----------------+-------------+----------+--------+  Prox upper arm    0.16              +-----------------+-------------+----------+--------+  Mid upper arm    0.18              +-----------------+-------------+----------+--------+  Dist upper arm    0.13              +-----------------+-------------+----------+--------+  Antecubital fossa  0.24              +-----------------+-------------+----------+--------+  Prox forearm     0.12               +-----------------+-------------+----------+--------+  Mid forearm     0.07              +-----------------+-------------+----------+--------+  Dist forearm     0.06              +-----------------+-------------+----------+--------+   +-----------------+-------------+----------+--------+  Left Basilic   Diameter (cm)Depth (cm)Findings  +-----------------+-------------+----------+--------+  Antecubital fossa            occluded  +-----------------+-------------+----------+--------+   Left Brachial vein:  proximal upper arm 0.45 cm  mid upper arm 0.43 cm  distal upper arm 0.43 cm  antecubital fossa 0.32 cm  MEDICAL ISSUES:   End-stage renal disease: Patient does not have any other options for fistula creation.  She is going to need a left arm graft.  We discussed placing either a forearm or a upper arm graft, based on the size of the vein at the antecubital crease.  I will consider using Artegraft since she had such a short duration of success with Gore-Tex.  This will be scheduled on a nondialysis day, likely Wednesday, February 3    Leia Alf, MD, [facility] [phone] Pager [phone]

## 2019-12-14 NOTE — Progress Notes (Signed)
Vascular and Vein Specialist of [place]  Patient name: Latoya Lopez MRN: [id number] DOB: [birth year] Sex: female   REASON FOR VISIT:    Follow up  HISOTRY OF PRESENT ILLNESS:    Latoya Lopez is a 79 y.o. 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  Renal failure has been complicated by anemia and hyperparathyroidism.  She takes a statin for hypercholesterolemia.  She is medically managed for hypertension.  She is a former smoker.  She is ambidextrous.  08-06-2017:  R BCF 08-17-2017:  R IJ TDC 08-21-2107:  R UE AVGG 07-07-2018:  Revision  RUE [place] for ulcer 09-17-2018:  T&R R UE AVGG 09-21-2018:  R IJ TDC 05-29-2019: 1st Left BVT 07-22-2019:  2nd left BVT PAST MEDICAL HISTORY:   Past Medical History:  Diagnosis Date  . Anemia of renal disease   . Cancer ([place])    skin  . Chronic kidney disease    Stage IV  . GERD (gastroesophageal reflux disease)   . Heart murmur   . Hyperkalemia   . Hyperlipidemia   . Hyperphosphatemia   . Hypertension    Essential with goal blood pressure less than 130/80  . Hypokalemia   . Hypoparathyroidism ([place])   . Hypothyroidism   . Membranous glomerulonephritis   . Proteinuria   . S/P partial hysterectomy   . Seasonal allergies   . Secondary hyperparathyroidism, renal ([place])   . Wears glasses      FAMILY HISTORY:   Family History  Problem Relation Age of Onset  . Stroke Mother   . Heart attack Father   . Heart attack Sister     SOCIAL HISTORY:   Social History   Tobacco Use  . Smoking status: Former Smoker    Types: Cigarettes  . Smokeless tobacco: Never Used  . Tobacco comment: quit smoking cigarettes in 1997  Substance Use Topics  . Alcohol use: No     ALLERGIES:   No Known Allergies   CURRENT MEDICATIONS:   Current Outpatient Medications  Medication Sig Dispense Refill  . acetaminophen (TYLENOL) 325 MG tablet Take 325 mg by mouth every 6  (six) hours as needed for mild pain.    Marland Kitchen amLODipine (NORVASC) 10 MG tablet Take 10 mg by mouth at bedtime.     Marland Kitchen atorvastatin (LIPITOR) 80 MG tablet Take 80 mg by mouth daily with breakfast.     . calcium acetate (PHOSLO) 667 MG capsule Take 2 capsules (1,334 mg total) by mouth 3 (three) times daily with meals. (Patient taking differently: Take 2,001 mg by mouth 2 (two) times a day. ) 90 capsule 0  . carvedilol (COREG) 12.5 MG tablet Take 12.5 mg by mouth See admin instructions. Take 1 tablet (12.5mg ) by mouth in the evening ONLY on Tuesdays, Thursdays, & Saturdays. Take 1 tablet (12.5 mg) by mouth twice daily on Sundays, Mondays, Wednesdays, & Fridays.    Marland Kitchen levothyroxine (SYNTHROID, LEVOTHROID) 75 MCG tablet Take 75 mcg by mouth daily before breakfast.    . lidocaine-prilocaine (EMLA) cream Apply 1 application topically See admin instructions. Apply small amount to access site 1 to 2 hours prior to dialysis.  12  . loratadine (CLARITIN) 10 MG tablet Take 10 mg by mouth daily with breakfast.     . Multiple Vitamin (MULTIVITAMIN WITH MINERALS) TABS tablet Take 1 tablet by mouth every evening.     Marland Kitchen omeprazole (PRILOSEC) 20 MG capsule Take 20 mg by mouth daily before breakfast.     .  raloxifene (EVISTA) 60 MG tablet Take 60 mg by mouth daily with breakfast.      No current facility-administered medications for this visit.    REVIEW OF SYSTEMS:   [X]  denotes positive finding, [ ]  denotes negative finding Cardiac  Comments:  Chest pain or chest pressure:    Shortness of breath upon exertion:    Short of breath when lying flat:    Irregular heart rhythm:        Vascular    Pain in calf, thigh, or hip brought on by ambulation:    Pain in feet at night that wakes you up from your sleep:     Blood clot in your veins:    Leg swelling:         Pulmonary    Oxygen at home:    Productive cough:     Wheezing:         Neurologic    Sudden weakness in arms or legs:     Sudden numbness in arms  or legs:     Sudden onset of difficulty speaking or slurred speech:    Temporary loss of vision in one eye:     Problems with dizziness:         Gastrointestinal    Blood in stool:     Vomited blood:         Genitourinary    Burning when urinating:     Blood in urine:        Psychiatric    Major depression:         Hematologic    Bleeding problems:    Problems with blood clotting too easily:        Skin    Rashes or ulcers:        Constitutional    Fever or chills:      PHYSICAL EXAM:   There were no vitals filed for this visit.  GENERAL: The patient is a well-nourished female, in no acute distress. The vital signs are documented above. CARDIAC: There is a regular rate and rhythm.  VASCULAR: Palpable left brachial and radial pulse PULMONARY: Non-labored respirations MUSCULOSKELETAL: There are no major deformities or cyanosis. NEUROLOGIC: No focal weakness or paresthesias are detected. SKIN: There are no ulcers or rashes noted. PSYCHIATRIC: The patient has a normal affect.  STUDIES:   I have ordered and reviewed the following vascular studies: Arterial: Left: No obstruction visualized in the left upper extremity.   Venous: +-----------------+-------------+----------+--------+  Left Cephalic  Diameter (cm)Depth (cm)Findings  +-----------------+-------------+----------+--------+  Shoulder       0.21              +-----------------+-------------+----------+--------+  Prox upper arm    0.16              +-----------------+-------------+----------+--------+  Mid upper arm    0.18              +-----------------+-------------+----------+--------+  Dist upper arm    0.13              +-----------------+-------------+----------+--------+  Antecubital fossa  0.24              +-----------------+-------------+----------+--------+  Prox forearm     0.12               +-----------------+-------------+----------+--------+  Mid forearm     0.07              +-----------------+-------------+----------+--------+  Dist forearm     0.06              +-----------------+-------------+----------+--------+   +-----------------+-------------+----------+--------+  Left Basilic   Diameter (cm)Depth (cm)Findings  +-----------------+-------------+----------+--------+  Antecubital fossa            occluded  +-----------------+-------------+----------+--------+   Left Brachial vein:  proximal upper arm 0.45 cm  mid upper arm 0.43 cm  distal upper arm 0.43 cm  antecubital fossa 0.32 cm  MEDICAL ISSUES:   End-stage renal disease: Patient does not have any other options for fistula creation.  She is going to need a left arm graft.  We discussed placing either a forearm or a upper arm graft, based on the size of the vein at the antecubital crease.  I will consider using Artegraft since she had such a short duration of success with Gore-Tex.  This will be scheduled on a nondialysis day, likely Wednesday, February 3    Leia Alf, MD, [facility] V[facility] and Vein Specialists of [facility] [phone] Pager [phone]

## 2019-12-21 ENCOUNTER — Other Ambulatory Visit (HOSPITAL_COMMUNITY)
Admission: RE | Admit: 2019-12-21 | Discharge: 2019-12-21 | Disposition: A | Discharge status: Home / Self Care | Payer: Medicare Other | Source: Ambulatory Visit | Attending: Surgery | Admitting: Surgery

## 2019-12-21 DIAGNOSIS — Z20822 Contact with and (suspected) exposure to covid-19: Secondary | ICD-10-CM | POA: Insufficient documentation

## 2019-12-21 DIAGNOSIS — Z01812 Encounter for preprocedural laboratory examination: Secondary | ICD-10-CM | POA: Diagnosis present

## 2019-12-21 LAB — SARS CORONAVIRUS 2 (TAT 6-24 HRS): SARS Coronavirus 2: NEGATIVE

## 2019-12-22 ENCOUNTER — Other Ambulatory Visit: Payer: Self-pay

## 2019-12-22 ENCOUNTER — Encounter (HOSPITAL_COMMUNITY): Payer: Self-pay | Admitting: Surgery

## 2019-12-22 NOTE — Progress Notes (Signed)
Latoya Lopez denies chest pain or shortness of breath. Patient tested negative for Covid on 12/21/2019 and has been in quarantine with her husband, except to go to dialysis wearing a mask.

## 2019-12-22 NOTE — Anesthesia Preprocedure Evaluation (Addendum)
Anesthesia Evaluation  Patient identified by MRN, date of birth, ID band Patient awake    Reviewed: Allergy & Precautions, NPO status , Patient's Chart, lab work & pertinent test results, reviewed documented beta blocker date and time   History of Anesthesia Complications Negative for: history of anesthetic complications  Airway Mallampati: I  TM Distance: >3 FB Neck ROM: Full    Dental  (+) Edentulous Upper, Edentulous Lower   Pulmonary former smoker (quit 1997),  12/21/2019 SARS coronavirus NEG   breath sounds clear to auscultation       Cardiovascular hypertension, Pt. on medications and Pt. on home beta blockers + Valvular Problems/Murmurs (mild) AS  Rhythm:Regular Rate:Normal  '19 ECHO: EF 60-65%, Mild AS, mild AI, mild-mod TR   Neuro/Psych negative neurological ROS     GI/Hepatic Neg liver ROS, GERD  Medicated and Controlled,  Endo/Other  Hypothyroidism   Renal/GU ESRF and DialysisRenal disease (MWF dialysis, K+ 4.6)     Musculoskeletal  (+) Arthritis ,   Abdominal   Peds  Hematology negative hematology ROS (+)   Anesthesia Other Findings   Reproductive/Obstetrics                            Anesthesia Physical Anesthesia Plan  ASA: III  Anesthesia Plan: MAC   Post-op Pain Management:    Induction:   PONV Risk Score and Plan: 2 and Treatment may vary due to age or medical condition, Ondansetron and Dexamethasone  Airway Management Planned: Natural Airway and Simple Face Mask  Additional Equipment:   Intra-op Plan:   Post-operative Plan:   Informed Consent: I have reviewed the patients History and Physical, chart, labs and discussed the procedure including the risks, benefits and alternatives for the proposed anesthesia with the patient or authorized representative who has indicated his/her understanding and acceptance.       Plan Discussed with: CRNA and  Surgeon  Anesthesia Plan Comments:        Anesthesia Quick Evaluation

## 2019-12-23 ENCOUNTER — Ambulatory Visit (HOSPITAL_COMMUNITY): Payer: Medicare Other | Admitting: Anesthesiology

## 2019-12-23 ENCOUNTER — Other Ambulatory Visit: Payer: Self-pay

## 2019-12-23 ENCOUNTER — Encounter (HOSPITAL_COMMUNITY)
Admission: RE | Disposition: A | Discharge status: Home / Self Care | Payer: Self-pay | Source: Home / Self Care | Attending: Surgery

## 2019-12-23 ENCOUNTER — Encounter (HOSPITAL_COMMUNITY): Payer: Self-pay | Admitting: Surgery

## 2019-12-23 ENCOUNTER — Ambulatory Visit (HOSPITAL_COMMUNITY)
Admission: RE | Admit: 2019-12-23 | Discharge: 2019-12-23 | Disposition: A | Discharge status: Home / Self Care | Payer: Medicare Other | Attending: Surgery | Admitting: Surgery

## 2019-12-23 DIAGNOSIS — Z87891 Personal history of nicotine dependence: Secondary | ICD-10-CM | POA: Insufficient documentation

## 2019-12-23 DIAGNOSIS — Z79899 Other long term (current) drug therapy: Secondary | ICD-10-CM | POA: Diagnosis not present

## 2019-12-23 DIAGNOSIS — I120 Hypertensive chronic kidney disease with stage 5 chronic kidney disease or end stage renal disease: Secondary | ICD-10-CM | POA: Insufficient documentation

## 2019-12-23 DIAGNOSIS — E785 Hyperlipidemia, unspecified: Secondary | ICD-10-CM | POA: Diagnosis not present

## 2019-12-23 DIAGNOSIS — Z79890 Hormone replacement therapy (postmenopausal): Secondary | ICD-10-CM | POA: Diagnosis not present

## 2019-12-23 DIAGNOSIS — D631 Anemia in chronic kidney disease: Secondary | ICD-10-CM | POA: Diagnosis not present

## 2019-12-23 DIAGNOSIS — Z992 Dependence on renal dialysis: Secondary | ICD-10-CM | POA: Insufficient documentation

## 2019-12-23 DIAGNOSIS — N186 End stage renal disease: Secondary | ICD-10-CM | POA: Diagnosis not present

## 2019-12-23 DIAGNOSIS — N2581 Secondary hyperparathyroidism of renal origin: Secondary | ICD-10-CM | POA: Diagnosis not present

## 2019-12-23 DIAGNOSIS — E039 Hypothyroidism, unspecified: Secondary | ICD-10-CM | POA: Insufficient documentation

## 2019-12-23 DIAGNOSIS — N185 Chronic kidney disease, stage 5: Secondary | ICD-10-CM | POA: Diagnosis not present

## 2019-12-23 DIAGNOSIS — E7800 Pure hypercholesterolemia, unspecified: Secondary | ICD-10-CM | POA: Insufficient documentation

## 2019-12-23 DIAGNOSIS — K219 Gastro-esophageal reflux disease without esophagitis: Secondary | ICD-10-CM | POA: Diagnosis not present

## 2019-12-23 HISTORY — DX: End stage renal disease: N18.6

## 2019-12-23 HISTORY — DX: Dyspnea, unspecified: R06.00

## 2019-12-23 HISTORY — DX: Unspecified osteoarthritis, unspecified site: M19.90

## 2019-12-23 HISTORY — PX: AV FISTULA PLACEMENT: SHX1204

## 2019-12-23 LAB — POCT I-STAT, CHEM 8
BUN: 20 mg/dL (ref 8–23)
Calcium, Ion: 1.17 mmol/L (ref 1.15–1.40)
Chloride: 97 mmol/L — ABNORMAL LOW (ref 98–111)
Creatinine, Ser: 4.5 mg/dL — ABNORMAL HIGH (ref 0.44–1.00)
Glucose, Bld: 114 mg/dL — ABNORMAL HIGH (ref 70–99)
HCT: 31 % — ABNORMAL LOW (ref 36.0–46.0)
Hemoglobin: 10.5 g/dL — ABNORMAL LOW (ref 12.0–15.0)
Potassium: 4.6 mmol/L (ref 3.5–5.1)
Sodium: 138 mmol/L (ref 135–145)
TCO2: 33 mmol/L — ABNORMAL HIGH (ref 22–32)

## 2019-12-23 SURGERY — INSERTION OF ARTERIOVENOUS (AV) GORE-TEX GRAFT ARM
Anesthesia: General | Site: Arm Upper | Laterality: Left

## 2019-12-23 MED ORDER — SODIUM CHLORIDE 0.9 % IV SOLN: INTRAVENOUS | Status: DC

## 2019-12-23 MED ORDER — PROPOFOL 10 MG/ML IV BOLUS
INTRAVENOUS | Status: DC | PRN
  Administered 2019-12-23: 120 mg via INTRAVENOUS
  Administered 2019-12-23 (×2): 40 mg via INTRAVENOUS

## 2019-12-23 MED ORDER — LIDOCAINE-EPINEPHRINE 1 %-1:100000 IJ SOLN
INTRAMUSCULAR | Status: DC | PRN
  Administered 2019-12-23: 10 mL via INTRADERMAL

## 2019-12-23 MED ORDER — SODIUM CHLORIDE 0.9 % IV SOLN
INTRAVENOUS | Status: AC
  Filled 2019-12-23: qty 1.2

## 2019-12-23 MED ORDER — PHENYLEPHRINE 40 MCG/ML (10ML) SYRINGE FOR IV PUSH (FOR BLOOD PRESSURE SUPPORT): PREFILLED_SYRINGE | INTRAVENOUS | Status: DC | PRN

## 2019-12-23 MED ORDER — CEFAZOLIN SODIUM-DEXTROSE 2-4 GM/100ML-% IV SOLN
INTRAVENOUS | Status: AC
  Filled 2019-12-23: qty 100

## 2019-12-23 MED ORDER — DEXAMETHASONE SODIUM PHOSPHATE 10 MG/ML IJ SOLN
INTRAMUSCULAR | Status: DC | PRN
  Administered 2019-12-23: 5 mg via INTRAVENOUS

## 2019-12-23 MED ORDER — HYDROCODONE-ACETAMINOPHEN 5-325 MG PO TABS: 1.0000 | ORAL_TABLET | ORAL | 0 refills | Status: DC | PRN

## 2019-12-23 MED ORDER — FENTANYL CITRATE (PF) 100 MCG/2ML IJ SOLN
25.0000 ug | INTRAMUSCULAR | Status: DC | PRN
  Administered 2019-12-23: 25 ug via INTRAVENOUS

## 2019-12-23 MED ORDER — 0.9 % SODIUM CHLORIDE (POUR BTL) OPTIME
TOPICAL | Status: DC | PRN
  Administered 2019-12-23: 1000 mL

## 2019-12-23 MED ORDER — SODIUM CHLORIDE 0.9 % IV SOLN
INTRAVENOUS | Status: DC | PRN
  Administered 2019-12-23: 500 mL

## 2019-12-23 MED ORDER — CEFAZOLIN SODIUM-DEXTROSE 2-4 GM/100ML-% IV SOLN
2.0000 g | INTRAVENOUS | Status: AC
  Administered 2019-12-23: 2 g via INTRAVENOUS

## 2019-12-23 MED ORDER — FENTANYL CITRATE (PF) 250 MCG/5ML IJ SOLN
INTRAMUSCULAR | Status: AC
  Filled 2019-12-23: qty 5

## 2019-12-23 MED ORDER — CHLORHEXIDINE GLUCONATE 4 % EX LIQD: 60.0000 mL | Freq: Once | CUTANEOUS | Status: DC

## 2019-12-23 MED ORDER — LIDOCAINE 2% (20 MG/ML) 5 ML SYRINGE
INTRAMUSCULAR | Status: DC | PRN
  Administered 2019-12-23: 30 mg via INTRAVENOUS

## 2019-12-23 MED ORDER — LIDOCAINE-EPINEPHRINE 1 %-1:100000 IJ SOLN
INTRAMUSCULAR | Status: AC
  Filled 2019-12-23: qty 1

## 2019-12-23 MED ORDER — FENTANYL CITRATE (PF) 100 MCG/2ML IJ SOLN
INTRAMUSCULAR | Status: AC
  Filled 2019-12-23: qty 2

## 2019-12-23 MED ORDER — PROTAMINE SULFATE 10 MG/ML IV SOLN
INTRAVENOUS | Status: DC | PRN
  Administered 2019-12-23: 25 mg via INTRAVENOUS

## 2019-12-23 MED ORDER — ONDANSETRON HCL 4 MG/2ML IJ SOLN
INTRAMUSCULAR | Status: DC | PRN
  Administered 2019-12-23: 4 mg via INTRAVENOUS

## 2019-12-23 MED ORDER — PHENYLEPHRINE HCL-NACL 10-0.9 MG/250ML-% IV SOLN
INTRAVENOUS | Status: DC | PRN
  Administered 2019-12-23: 25 ug/min via INTRAVENOUS

## 2019-12-23 MED ORDER — HEPARIN SODIUM (PORCINE) 1000 UNIT/ML IJ SOLN
INTRAMUSCULAR | Status: DC | PRN
  Administered 2019-12-23: 3000 [IU] via INTRAVENOUS

## 2019-12-23 MED ORDER — FENTANYL CITRATE (PF) 100 MCG/2ML IJ SOLN
INTRAMUSCULAR | Status: DC | PRN
  Administered 2019-12-23 (×2): 25 ug via INTRAVENOUS

## 2019-12-23 SURGICAL SUPPLY — 41 items
ADH SKN CLS APL DERMABOND .7 (GAUZE/BANDAGES/DRESSINGS) ×1
ARMBAND PINK RESTRICT EXTREMIT (MISCELLANEOUS) ×4 IMPLANT
CANISTER SUCT 3000ML PPV (MISCELLANEOUS) ×3 IMPLANT
CANNULA VESSEL 3MM 2 BLNT TIP (CANNULA) ×2 IMPLANT
CLIP VESOCCLUDE MED 6/CT (CLIP) ×3 IMPLANT
CLIP VESOCCLUDE SM WIDE 6/CT (CLIP) ×3 IMPLANT
COVER PROBE W GEL 5X96 (DRAPES) ×2 IMPLANT
COVER WAND RF STERILE (DRAPES) ×1 IMPLANT
DERMABOND ADVANCED (GAUZE/BANDAGES/DRESSINGS) ×2
DERMABOND ADVANCED .7 DNX12 (GAUZE/BANDAGES/DRESSINGS) ×1 IMPLANT
ELECT REM PT RETURN 9FT ADLT (ELECTROSURGICAL) ×3
ELECTRODE REM PT RTRN 9FT ADLT (ELECTROSURGICAL) ×1 IMPLANT
GAUZE SPONGE 4X4 12PLY STRL (GAUZE/BANDAGES/DRESSINGS) ×2 IMPLANT
GLOVE BIOGEL PI IND STRL 6 (GLOVE) IMPLANT
GLOVE BIOGEL PI IND STRL 7.5 (GLOVE) ×1 IMPLANT
GLOVE BIOGEL PI INDICATOR 6 (GLOVE) ×2
GLOVE BIOGEL PI INDICATOR 7.5 (GLOVE) ×2
GLOVE SURG SS PI 7.5 STRL IVOR (GLOVE) ×5 IMPLANT
GOWN STRL REUS W/ TWL LRG LVL3 (GOWN DISPOSABLE) ×2 IMPLANT
GOWN STRL REUS W/ TWL XL LVL3 (GOWN DISPOSABLE) ×1 IMPLANT
GOWN STRL REUS W/TWL LRG LVL3 (GOWN DISPOSABLE) ×6
GOWN STRL REUS W/TWL XL LVL3 (GOWN DISPOSABLE) ×3
GRAFT COLLAGEN VASCULAR 7X40 (Vascular Products) ×2 IMPLANT
HEMOSTAT SNOW SURGICEL 2X4 (HEMOSTASIS) ×2 IMPLANT
KIT BASIN OR (CUSTOM PROCEDURE TRAY) ×3 IMPLANT
KIT TURNOVER KIT B (KITS) ×3 IMPLANT
NS IRRIG 1000ML POUR BTL (IV SOLUTION) ×3 IMPLANT
PACK CV ACCESS (CUSTOM PROCEDURE TRAY) ×3 IMPLANT
PAD ARMBOARD 7.5X6 YLW CONV (MISCELLANEOUS) ×6 IMPLANT
STAPLER VISISTAT 35W (STAPLE) ×2 IMPLANT
SUT ETHILON 3 0 PS 1 (SUTURE) ×2 IMPLANT
SUT PROLENE 6 0 BV (SUTURE) ×8 IMPLANT
SUT VIC AB 3-0 SH 27 (SUTURE) ×6
SUT VIC AB 3-0 SH 27X BRD (SUTURE) ×2 IMPLANT
SUT VICRYL 4-0 PS2 18IN ABS (SUTURE) ×4 IMPLANT
SYR 20ML LL LF (SYRINGE) ×2 IMPLANT
SYR TOOMEY 50ML (SYRINGE) IMPLANT
TAPE CLOTH SURG 4X10 WHT LF (GAUZE/BANDAGES/DRESSINGS) ×2 IMPLANT
TOWEL GREEN STERILE (TOWEL DISPOSABLE) ×3 IMPLANT
UNDERPAD 30X30 (UNDERPADS AND DIAPERS) ×3 IMPLANT
WATER STERILE IRR 1000ML POUR (IV SOLUTION) ×3 IMPLANT

## 2019-12-23 NOTE — Anesthesia Postprocedure Evaluation (Signed)
Anesthesia Post Note  Patient: Latoya Lopez  Procedure(s) Performed: INSERTION OF LEFT ARM ARTERIOVENOUS (AV) artegraft (Left Arm Upper)     Patient location during evaluation: PACU Level of consciousness: awake and alert, patient cooperative and oriented Pain management: pain level controlled Vital Signs Assessment: post-procedure vital signs reviewed and stable Respiratory status: spontaneous breathing, nonlabored ventilation and respiratory function stable Cardiovascular status: blood pressure returned to baseline and stable Postop Assessment: no apparent nausea or vomiting Anesthetic complications: no    Last Vitals:  Vitals:   12/23/19 1103 12/23/19 1118  BP: (!) 156/82 (!) 151/81  Pulse: 85 87  Resp: 11 14  Temp:  36.9 C  SpO2: 100% 100%    Last Pain:  Vitals:   12/23/19 1118  TempSrc:   PainSc: 4                  Babak Lucus,E. Akiko Schexnider

## 2019-12-23 NOTE — Interval H&P Note (Signed)
History and Physical Interval Note:  12/23/2019 8:17 AM  Latoya Lopez  has presented today for surgery, with the diagnosis of END STAGE RENAL DISEASE.  The various methods of treatment have been discussed with the patient and family. After consideration of risks, benefits and other options for treatment, the patient has consented to  Procedure(s): INSERTION OF LEFT ARM ARTERIOVENOUS (AV) GORE-TEX GRAFT ARM (Left) as a surgical intervention.  [name] history has been reviewed, patient examined, no change in status, stable for surgery.  I have reviewed the patient's chart and labs.  Questions were answered to the patient's satisfaction.     Annamarie Major

## 2019-12-23 NOTE — Transfer of Care (Signed)
Immediate Anesthesia Transfer of Care Note  Patient: Latoya Lopez  Procedure(s) Performed: INSERTION OF LEFT ARM ARTERIOVENOUS (AV) artegraft (Left Arm Upper)  Patient Location: PACU  Anesthesia Type:General  Level of Consciousness: awake and alert   Airway & Oxygen Therapy: Patient Spontanous Breathing and Patient connected to nasal cannula oxygen  Post-op Assessment: Report given to RN and Post -op Vital signs reviewed and stable  Post vital signs: Reviewed and stable  Last Vitals:  Vitals Value Taken Time  BP 161/81 12/23/19 1018  Temp    Pulse 84 12/23/19 1021  Resp 12 12/23/19 1021  SpO2 100 % 12/23/19 1021  Vitals shown include unvalidated device data.  Last Pain:  Vitals:   12/23/19 0654  TempSrc:   PainSc: 0-No pain         Complications: No apparent anesthesia complications

## 2019-12-23 NOTE — Progress Notes (Signed)
Wasted 1.5 cc of fentanyl in Stericycle.  Witness Designer, jewellery.

## 2019-12-23 NOTE — Anesthesia Procedure Notes (Signed)
Procedure Name: LMA Insertion Date/Time: 12/23/2019 8:33 AM Performed by: Imagene Riches, CRNA Pre-anesthesia Checklist: Patient identified, Emergency Drugs available, Suction available and Patient being monitored Patient Re-evaluated:Patient Re-evaluated prior to induction Oxygen Delivery Method: Circle System Utilized Preoxygenation: Pre-oxygenation with 100% oxygen Induction Type: IV induction Ventilation: Mask ventilation without difficulty LMA: LMA inserted LMA Size: 4.0 Number of attempts: 1 Airway Equipment and Method: Bite block Placement Confirmation: positive ETCO2 Tube secured with: Tape Dental Injury: Teeth and Oropharynx as per pre-operative assessment

## 2019-12-23 NOTE — Discharge Instructions (Signed)
° °  Vascular and Vein Specialists of [place] ° °Discharge Instructions ° °AV Fistula or Graft Surgery for Dialysis Access ° °Please refer to the following instructions for your post-procedure care. Your surgeon or physician assistant will discuss any changes with you. ° °Activity ° °You may drive the day following your surgery, if you are comfortable and no longer taking prescription pain medication. Resume full activity as the soreness in your incision resolves. ° °Bathing/Showering ° °You may shower after you go home. Keep your incision dry for 48 hours. Do not soak in a bathtub, hot tub, or swim until the incision heals completely. You may not shower if you have a hemodialysis catheter. ° °Incision Care ° °Clean your incision with mild soap and water after 48 hours. Pat the area dry with a clean towel. You do not need a bandage unless otherwise instructed. Do not apply any ointments or creams to your incision. You may have skin glue on your incision. Do not peel it off. It will come off on its own in about one week. Your arm may swell a bit after surgery. To reduce swelling use pillows to elevate your arm so it is above your heart. Your doctor will tell you if you need to lightly wrap your arm with an ACE bandage. ° °Diet ° °Resume your normal diet. There are not special food restrictions following this procedure. In order to heal from your surgery, it is CRITICAL to get adequate nutrition. Your body requires vitamins, minerals, and protein. Vegetables are the best source of vitamins and minerals. Vegetables also provide the perfect balance of protein. Processed food has little nutritional value, so try to avoid this. ° °Medications ° °Resume taking all of your medications. If your incision is causing pain, you may take over-the counter pain relievers such as acetaminophen (Tylenol). If you were prescribed a stronger pain medication, please be aware these medications can cause nausea and constipation. Prevent  nausea by taking the medication with a snack or meal. Avoid constipation by drinking plenty of fluids and eating foods with high amount of fiber, such as fruits, vegetables, and grains. Do not take Tylenol if you are taking prescription pain medications. ° ° ° ° °Follow up °Your surgeon may want to see you in the office following your access surgery. If so, this will be arranged at the time of your surgery. ° °Please call us immediately for any of the following conditions: ° °Increased pain, redness, drainage (pus) from your incision site °Fever of 101 degrees or higher °Severe or worsening pain at your incision site °Hand pain or numbness. ° °Reduce your risk of vascular disease: ° °Stop smoking. If you would like help, call [facility] at 1-800-QUIT-NOW (1-800-784-8669) or [place] at 336-586-4000 ° °Manage your cholesterol °Maintain a desired weight °Control your diabetes °Keep your blood pressure down ° °Dialysis ° °It will take several weeks to several months for your new dialysis access to be ready for use. Your surgeon will determine when it is OK to use it. Your nephrologist will continue to direct your dialysis. You can continue to use your Permcath until your new access is ready for use. ° °If you have any questions, please call the office at [phone]. ° °

## 2019-12-23 NOTE — Op Note (Signed)
    Patient name: Latoya Lopez MRN: [id number] DOB: [birth year] Sex: female  12/23/2019 Pre-operative Diagnosis: ESRD Post-operative diagnosis:  Same Surgeon:  Annamarie Major Assistants:  Ellsworth Lennox Procedure:   Left FA AVGG Anesthesia:  General Blood Loss:  minimal Specimens:  none  Findings:  Very thin skin.  Venous anastamosis is medial  Indications: Patient is had multiple dialysis access in the past, none of which have had long-term success.  She comes in for new access.  Procedure:  The patient was identified in the holding area and taken to [place] [facility]  The patient was then placed supine on the table. general anesthesia was administered.  The patient was prepped and draped in the usual sterile fashion.  A time out was called and antibiotics were administered.  A transverse incision was made in the antecubital region.  Cautery is about subtenons tissue down to the fascia which was opened with cautery.  I then dissected out the brachial artery which was a 4 mm artery.  Posterior to this was a 4 mm brachial vein.  I then made a counterincision in the distal forearm.  A subcutaneous tunnel was created between the 2 incisions using a curved Gore tunneler.  Unfortunately due to health and her skin was the tunneler exited the skin.  I had to repair this with staples because when I tried to use sutures that tore through the skin.  Next a 6 mm Artegraft was brought onto the field.  It was appropriately prepared and marked for orientation.  The patient was given 3000 units of heparin.  The brachial artery was then occluded with baby Gregory clamps.  A #11 lesion make an arteriotomy which was extended longitudinally with Potts scissors.  The graft was beveled to fit the size of the arteriotomy.  A running anastomosis was created in end-to-side fashion with 6-0 Prolene.  There was excellent flow through the graft which was flushed with heparin saline and reoccluded.  It was cut to the appropriate  length.  The vein was then occluded and opened with a #11 blade and extended longitudinally with Potts scissors.  An end-to-side anastomosis was then created with 6-0 Prolene.  Prior to completion the appropriate flushing maneuvers were performed the anastomosis was completed.  Clamps were released.  There was an excellent thrill within the graft.  Patient had brisk radial and ulnar Doppler signals.  25 mg of protamine was given.  Hemostasis was achieved.  The incisions were irrigated.  Staples were used to close the skin injury site as well as the counterincision.  3-0 Vicryl was used to close the antecubital incision followed by 4-0 Vicryl in the skin and Dermabond.  There were no immediate complications.  She was successfully extubated taken recovery stable condition.   Disposition: To PACU stable   V. Annamarie Major, M.D., [facility] Vascular and Vein Specialists of [place] Office: [phone] Pager:  [phone]

## 2019-12-28 ENCOUNTER — Ambulatory Visit: Payer: Medicare Other

## 2019-12-28 ENCOUNTER — Inpatient Hospital Stay (HOSPITAL_COMMUNITY)
Admission: AD | Admit: 2019-12-28 | Discharge: 2019-12-30 | DRG: 871 | Disposition: A | Discharge status: Home / Self Care | Payer: Medicare Other | Source: Other Acute Inpatient Hospital | Attending: Internal Medicine | Admitting: Internal Medicine

## 2019-12-28 DIAGNOSIS — Z90710 Acquired absence of both cervix and uterus: Secondary | ICD-10-CM | POA: Diagnosis not present

## 2019-12-28 DIAGNOSIS — Z8249 Family history of ischemic heart disease and other diseases of the circulatory system: Secondary | ICD-10-CM | POA: Diagnosis not present

## 2019-12-28 DIAGNOSIS — Z85828 Personal history of other malignant neoplasm of skin: Secondary | ICD-10-CM | POA: Diagnosis not present

## 2019-12-28 DIAGNOSIS — N186 End stage renal disease: Secondary | ICD-10-CM | POA: Diagnosis present

## 2019-12-28 DIAGNOSIS — Z992 Dependence on renal dialysis: Secondary | ICD-10-CM

## 2019-12-28 DIAGNOSIS — E039 Hypothyroidism, unspecified: Secondary | ICD-10-CM | POA: Diagnosis present

## 2019-12-28 DIAGNOSIS — Z20822 Contact with and (suspected) exposure to covid-19: Secondary | ICD-10-CM | POA: Diagnosis present

## 2019-12-28 DIAGNOSIS — K219 Gastro-esophageal reflux disease without esophagitis: Secondary | ICD-10-CM | POA: Diagnosis present

## 2019-12-28 DIAGNOSIS — I120 Hypertensive chronic kidney disease with stage 5 chronic kidney disease or end stage renal disease: Secondary | ICD-10-CM | POA: Diagnosis present

## 2019-12-28 DIAGNOSIS — Z87891 Personal history of nicotine dependence: Secondary | ICD-10-CM | POA: Diagnosis not present

## 2019-12-28 DIAGNOSIS — D638 Anemia in other chronic diseases classified elsewhere: Secondary | ICD-10-CM | POA: Diagnosis present

## 2019-12-28 DIAGNOSIS — D631 Anemia in chronic kidney disease: Secondary | ICD-10-CM | POA: Diagnosis present

## 2019-12-28 DIAGNOSIS — Z823 Family history of stroke: Secondary | ICD-10-CM

## 2019-12-28 DIAGNOSIS — J9601 Acute respiratory failure with hypoxia: Secondary | ICD-10-CM | POA: Diagnosis present

## 2019-12-28 DIAGNOSIS — E8889 Other specified metabolic disorders: Secondary | ICD-10-CM | POA: Diagnosis present

## 2019-12-28 DIAGNOSIS — N2581 Secondary hyperparathyroidism of renal origin: Secondary | ICD-10-CM | POA: Diagnosis present

## 2019-12-28 DIAGNOSIS — R509 Fever, unspecified: Secondary | ICD-10-CM | POA: Diagnosis present

## 2019-12-28 DIAGNOSIS — Z79899 Other long term (current) drug therapy: Secondary | ICD-10-CM | POA: Diagnosis not present

## 2019-12-28 DIAGNOSIS — E785 Hyperlipidemia, unspecified: Secondary | ICD-10-CM | POA: Diagnosis present

## 2019-12-28 DIAGNOSIS — J302 Other seasonal allergic rhinitis: Secondary | ICD-10-CM | POA: Diagnosis present

## 2019-12-28 DIAGNOSIS — Z79890 Hormone replacement therapy (postmenopausal): Secondary | ICD-10-CM | POA: Diagnosis not present

## 2019-12-28 DIAGNOSIS — A419 Sepsis, unspecified organism: Principal | ICD-10-CM

## 2019-12-28 LAB — RENAL FUNCTION PANEL
Albumin: 3.1 g/dL — ABNORMAL LOW (ref 3.5–5.0)
Anion gap: 18 — ABNORMAL HIGH (ref 5–15)
BUN: 83 mg/dL — ABNORMAL HIGH (ref 8–23)
CO2: 21 mmol/L — ABNORMAL LOW (ref 22–32)
Calcium: 8.9 mg/dL (ref 8.9–10.3)
Chloride: 102 mmol/L (ref 98–111)
Creatinine, Ser: 9.84 mg/dL — ABNORMAL HIGH (ref 0.44–1.00)
GFR calc Af Amer: 4 mL/min — ABNORMAL LOW (ref >60)
GFR calc non Af Amer: 3 mL/min — ABNORMAL LOW (ref >60)
Glucose, Bld: 124 mg/dL — ABNORMAL HIGH (ref 70–99)
Phosphorus: 5.8 mg/dL — ABNORMAL HIGH (ref 2.5–4.6)
Potassium: 5.5 mmol/L — ABNORMAL HIGH (ref 3.5–5.1)
Sodium: 141 mmol/L (ref 135–145)

## 2019-12-28 LAB — CBC
HCT: 28 % — ABNORMAL LOW (ref 36.0–46.0)
Hemoglobin: 8.5 g/dL — ABNORMAL LOW (ref 12.0–15.0)
MCH: 30 pg (ref 26.0–34.0)
MCHC: 30.4 g/dL (ref 30.0–36.0)
MCV: 98.9 fL (ref 80.0–100.0)
Platelets: 141 10*3/uL — ABNORMAL LOW (ref 150–400)
RBC: 2.83 MIL/uL — ABNORMAL LOW (ref 3.87–5.11)
RDW: 17.1 % — ABNORMAL HIGH (ref 11.5–15.5)
WBC: 6.8 10*3/uL (ref 4.0–10.5)
nRBC: 0 % (ref 0.0–0.2)

## 2019-12-28 LAB — SARS CORONAVIRUS 2 (TAT 6-24 HRS): SARS Coronavirus 2: NEGATIVE

## 2019-12-28 LAB — HEPATITIS B SURFACE ANTIGEN: Hepatitis B Surface Ag: NONREACTIVE

## 2019-12-28 MED ORDER — VANCOMYCIN HCL IN DEXTROSE 1-5 GM/200ML-% IV SOLN: 1000.0000 mg | Freq: Once | INTRAVENOUS | Status: DC

## 2019-12-28 MED ORDER — LIDOCAINE HCL (PF) 1 % IJ SOLN: 5.0000 mL | INTRAMUSCULAR | Status: DC | PRN

## 2019-12-28 MED ORDER — ACETAMINOPHEN 650 MG RE SUPP: 650.0000 mg | Freq: Four times a day (QID) | RECTAL | Status: DC | PRN

## 2019-12-28 MED ORDER — SODIUM CHLORIDE 0.9 % IV SOLN
2.0000 g | Freq: Once | INTRAVENOUS | Status: AC
  Administered 2019-12-29: 2 g via INTRAVENOUS
  Filled 2019-12-28: qty 2

## 2019-12-28 MED ORDER — LIDOCAINE-PRILOCAINE 2.5-2.5 % EX CREA
1.0000 "application " | TOPICAL_CREAM | CUTANEOUS | Status: DC | PRN
  Filled 2019-12-28: qty 5

## 2019-12-28 MED ORDER — CARVEDILOL 12.5 MG PO TABS
12.5000 mg | ORAL_TABLET | ORAL | Status: DC
  Administered 2019-12-29: 12.5 mg via ORAL
  Filled 2019-12-28: qty 1

## 2019-12-28 MED ORDER — CARVEDILOL 12.5 MG PO TABS
12.5000 mg | ORAL_TABLET | ORAL | Status: DC
  Administered 2019-12-29 – 2019-12-30 (×2): 12.5 mg via ORAL
  Filled 2019-12-28 (×2): qty 1

## 2019-12-28 MED ORDER — ONDANSETRON HCL 4 MG/2ML IJ SOLN: 4.0000 mg | Freq: Four times a day (QID) | INTRAMUSCULAR | Status: DC | PRN

## 2019-12-28 MED ORDER — DARBEPOETIN ALFA 60 MCG/0.3ML IJ SOSY
60.0000 ug | PREFILLED_SYRINGE | INTRAMUSCULAR | Status: DC
  Filled 2019-12-28: qty 0.3

## 2019-12-28 MED ORDER — ALBUTEROL SULFATE (2.5 MG/3ML) 0.083% IN NEBU: 2.5000 mg | INHALATION_SOLUTION | Freq: Four times a day (QID) | RESPIRATORY_TRACT | Status: DC | PRN

## 2019-12-28 MED ORDER — ALTEPLASE 2 MG IJ SOLR: 2.0000 mg | Freq: Once | INTRAMUSCULAR | Status: DC | PRN

## 2019-12-28 MED ORDER — HEPARIN SODIUM (PORCINE) 1000 UNIT/ML DIALYSIS
1000.0000 [IU] | INTRAMUSCULAR | Status: DC | PRN
  Filled 2019-12-28: qty 1

## 2019-12-28 MED ORDER — SODIUM CHLORIDE 0.9 % IV SOLN: 100.0000 mL | INTRAVENOUS | Status: DC | PRN

## 2019-12-28 MED ORDER — AMLODIPINE BESYLATE 10 MG PO TABS
10.0000 mg | ORAL_TABLET | Freq: Every day | ORAL | Status: DC
  Administered 2019-12-29 (×2): 10 mg via ORAL
  Filled 2019-12-28 (×2): qty 1

## 2019-12-28 MED ORDER — ATORVASTATIN CALCIUM 80 MG PO TABS
80.0000 mg | ORAL_TABLET | Freq: Every day | ORAL | Status: DC
  Administered 2019-12-29 – 2019-12-30 (×2): 80 mg via ORAL
  Filled 2019-12-28 (×2): qty 1

## 2019-12-28 MED ORDER — CHLORHEXIDINE GLUCONATE CLOTH 2 % EX PADS
6.0000 | MEDICATED_PAD | Freq: Every day | CUTANEOUS | Status: DC
  Administered 2019-12-29: 6 via TOPICAL

## 2019-12-28 MED ORDER — HYDROCODONE-ACETAMINOPHEN 5-325 MG PO TABS: 1.0000 | ORAL_TABLET | ORAL | Status: DC | PRN

## 2019-12-28 MED ORDER — CALCIUM ACETATE (PHOS BINDER) 667 MG PO CAPS
2001.0000 mg | ORAL_CAPSULE | Freq: Two times a day (BID) | ORAL | Status: DC
  Administered 2019-12-28 – 2019-12-30 (×4): 2001 mg via ORAL
  Filled 2019-12-28 (×4): qty 3

## 2019-12-28 MED ORDER — LEVOTHYROXINE SODIUM 75 MCG PO TABS
75.0000 ug | ORAL_TABLET | Freq: Every day | ORAL | Status: DC
  Administered 2019-12-29 – 2019-12-30 (×2): 75 ug via ORAL
  Filled 2019-12-28 (×2): qty 1

## 2019-12-28 MED ORDER — ACETAMINOPHEN 325 MG PO TABS: 650.0000 mg | ORAL_TABLET | Freq: Four times a day (QID) | ORAL | Status: DC | PRN

## 2019-12-28 MED ORDER — VANCOMYCIN HCL 1500 MG/300ML IV SOLN
1500.0000 mg | Freq: Once | INTRAVENOUS | Status: AC
  Administered 2019-12-28: 1500 mg via INTRAVENOUS
  Filled 2019-12-28: qty 300

## 2019-12-28 MED ORDER — PENTAFLUOROPROP-TETRAFLUOROETH EX AERO: 1.0000 "application " | INHALATION_SPRAY | CUTANEOUS | Status: DC | PRN

## 2019-12-28 MED ORDER — HEPARIN SODIUM (PORCINE) 5000 UNIT/ML IJ SOLN
5000.0000 [IU] | Freq: Three times a day (TID) | INTRAMUSCULAR | Status: DC
  Administered 2019-12-28 – 2019-12-30 (×4): 5000 [IU] via SUBCUTANEOUS
  Filled 2019-12-28 (×6): qty 1

## 2019-12-28 MED ORDER — VANCOMYCIN HCL IN DEXTROSE 750-5 MG/150ML-% IV SOLN
750.0000 mg | INTRAVENOUS | Status: DC
  Filled 2019-12-28: qty 150

## 2019-12-28 MED ORDER — RALOXIFENE HCL 60 MG PO TABS
60.0000 mg | ORAL_TABLET | Freq: Every day | ORAL | Status: DC
  Administered 2019-12-29 – 2019-12-30 (×2): 60 mg via ORAL
  Filled 2019-12-28 (×3): qty 1

## 2019-12-28 MED ORDER — PANTOPRAZOLE SODIUM 40 MG PO TBEC
40.0000 mg | DELAYED_RELEASE_TABLET | Freq: Two times a day (BID) | ORAL | Status: DC
  Administered 2019-12-29 – 2019-12-30 (×4): 40 mg via ORAL
  Filled 2019-12-28 (×4): qty 1

## 2019-12-28 MED ORDER — SODIUM CHLORIDE 0.9% FLUSH
3.0000 mL | Freq: Two times a day (BID) | INTRAVENOUS | Status: DC
  Administered 2019-12-29 – 2019-12-30 (×3): 3 mL via INTRAVENOUS

## 2019-12-28 MED ORDER — SODIUM CHLORIDE 0.9 % IV SOLN
2.0000 g | INTRAVENOUS | Status: DC
  Filled 2019-12-28: qty 2

## 2019-12-28 MED ORDER — CARVEDILOL 12.5 MG PO TABS: 12.5000 mg | ORAL_TABLET | ORAL | Status: DC

## 2019-12-28 MED ORDER — LIDOCAINE-PRILOCAINE 2.5-2.5 % EX CREA
1.0000 "application " | TOPICAL_CREAM | Freq: Every day | CUTANEOUS | Status: DC
  Filled 2019-12-28: qty 5

## 2019-12-28 MED ORDER — SODIUM CHLORIDE 0.9 % IV SOLN
2.0000 g | Freq: Once | INTRAVENOUS | Status: DC
  Filled 2019-12-28: qty 2

## 2019-12-28 MED ORDER — ONDANSETRON HCL 4 MG PO TABS: 4.0000 mg | ORAL_TABLET | Freq: Four times a day (QID) | ORAL | Status: DC | PRN

## 2019-12-28 NOTE — Consult Note (Signed)
Asked to see patient to rule out graft infection left arm.  Patient had a left forearm AV graft (Artegraft) placed by Dr. Trula Slade 5 days ago.  She was seen at the [facility] ER today with nausea vomiting shortness of breath.  She apparently had some fever as well.  I am unable to find records regarding this.  She has had some swelling in her forearm and hand since just after the operation.  She has tried to elevate this with really no relief.  She also has a left chest dialysis catheter.  She has had a small amount of serous drainage from the incisions.  Physical exam:  Vitals:   12/28/19 1517  BP: (!) 169/75  Pulse: 96  Resp: 20  Temp: 98.4 F (36.9 C)  TempSrc: Oral  SpO2: 100%   Left upper extremity exam etc. edematous from the mid upper arm into the left hand, 1+ left radial pulse hand is pink and warm, some ecchymosis no real erythema no purulent drainage  Neuro: No numbness or tingling in the hand motor function is intact in the left hand I did  She does not have a left-sided dialysis catheter nontender no obvious chest wall collaterals  Data:  CBC    Component Value Date/Time   WBC 5.8 09/22/2018 0147   RBC 2.43 (L) 09/22/2018 0147   HGB 10.5 (L) 12/23/2019 0648   HCT 31.0 (L) 12/23/2019 0648   PLT 139 (L) 09/22/2018 0147   MCV 101.6 (H) 09/22/2018 0147   MCH 30.9 09/22/2018 0147   MCHC 30.4 09/22/2018 0147   RDW 16.7 (H) 09/22/2018 0147   LYMPHSABS 1.3 09/20/2018 1310   MONOABS 0.5 09/20/2018 1310   EOSABS 0.5 09/20/2018 1310   BASOSABS 0.1 09/20/2018 1310    BMET    Component Value Date/Time   NA 138 12/23/2019 0648   K 4.6 12/23/2019 0648   CL 97 (L) 12/23/2019 0648   CO2 21 (L) 09/22/2018 0739   GLUCOSE 114 (H) 12/23/2019 0648   BUN 20 12/23/2019 0648   CREATININE 4.50 (H) 12/23/2019 0648   CALCIUM 8.8 (L) 09/22/2018 0739   GFRNONAA 4 (L) 09/22/2018 0739   GFRAA 4 (L) 09/22/2018 0739   Assessment: Left arm swelling status post graft placement.  Some of  this would be expected from usual postoperative trauma.  However, she certainly could be at risk for graft infection as well.  It is very early on the course to tell whether or not this is just postoperative edema versus graft infection.  Plan: Elevate left arm above level of heart  We will recheck her tomorrow.  We will get peripheral blood cultures as well as blood cultures from the levels on dialysis catheter  Patient will need repeat COVID testing in case she needs to go to the operating room.  Ruta Hinds, MD V[facility] and Vein Specialists of [place] Office: [phone]

## 2019-12-28 NOTE — Progress Notes (Addendum)
Pharmacy Antibiotic Note  Latoya Lopez is a 79 y.o. female admitted on 12/28/2019 with  Fever/SIRS: Patient noted to be febrile up to 102  Pharmacy has been consulted for empiric Vancomycin and Cefepime for fever of unknown source/sepsis. The patient has  ESRD, on HD TTS, last HD 2/11, missed 2/13 Sat HD due to loose bowels> Plan HD 2/15 PM  At [facility], patient received Cefepime 2g IV on 12/27/19 @ 22:43 and azithromycin 500 mg po on 2/15 early AM. Next Cefepime 2 g dose dose due after HD tonight.     Plan: Vancomycin 1500 mg IV x1 Now then 750 mg IV qHD TTS Cefepime 2g IV after qHD-TTS. Give next dose after HD tonight. Monitor clinical status, HD sessions and culture results daily.  Follow-up blood cultures at [facility] (24hrs), Avg:98.4 F (36.9 C), Min:98.4 F (36.9 C), Max:98.4 F (36.9 C)  Recent Labs  Lab 12/23/19 0648 12/28/19 1659  WBC  --  6.8  CREATININE 4.50*  --     Estimated Creatinine Clearance: 11.1 mL/min (A) (by C-G formula based on SCr of 4.5 mg/dL (H)).    No Known Allergies  Antimicrobials this admission: Vancomycin 2/15>> Cefepime 2/14 >>  (started at [facility]) Azithromycin 2/15 x1  Dose adjustments this admission: n/a  Microbiology results: 2/8 COVID 19: neg  Thank you for allowing pharmacy to be a part of this patient's care. Nicole Cella, RPh Clinical Pharmacist Please check AMION for all [place] phone numbers After 10:00 PM, call [place] [phone] 12/28/2019 7:18 PM

## 2019-12-28 NOTE — H&P (Addendum)
History and Physical    Latoya Lopez XIP:[id number] DOB: [birth year] DOA: 12/28/2019  Referring MD/NP/PA: Jani Gravel, MD PCP: [place]  Patient coming from: Transfer from [facility]  Chief Complaint: Shortness of breath  I have personally briefly reviewed patient's old medical records in [place]   HPI: Latoya Lopez is a 79 y.o. 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  She presented to [facility] with complaints of shortness of breath with nausea and vomiting.  She just recently had an AV fistula graft placed of the left arm by Dr. Trula Slade on 2/10.  Patient previously had a fistula in the right arm, but states that it no longer works.  Since the procedure she reports having soreness in the arm with increasing swelling.  Patient has been taking hydrocodone for pain with some improvement.  She received her first COVID-19 vaccine 4 days ago.  Subsequently, she has had a little bit of diarrhea and has had subjective fevers at home.  Denies having any significant abdominal pain, chest pain, headache, cough, recent sick contacts.  Normally she is not on oxygen at baseline.  Last hemodialysis session on 2/11 because she missed her hemodialysis session on 2/13 due to the diarrhea.    At [facility] patient was reported to be febrile up to 102 degrees, tachycardic, and and tachypneic with O2 saturations as low as 80% on room air.  O2 saturations improved 4 L of nasal cannula oxygen.  Chest x-ray showed increased vascular congestion and development of diffuse interstitial and hazy pulmonary opacity concerning for possible edema.  There also was a more confluent airspace opacity in the right lung base concerning for atelectasis versus pneumonia.  Labs significant for MVC 9.4, hemoglobin 9.3, potassium 4.8, BUN 69, creatinine 8.8, glucose 144, lactic acid 0.9, D-dimer 853. Blood cultures were  obtain  Patient had been given 6 mg of Decadron prior to Covid and influenza screening coming back as negative.  Ultrasound of the left forearm noted a patent AV graft with monophasic waves in the left subclavian, axillary, and brachial artery. Patient had been given empiric antibiotics of cefepime and azithromycin.   ED Course: As seen above  Review of Systems  Constitutional: Positive for fever.  HENT: Negative for ear discharge and nosebleeds.   Eyes: Negative for photophobia and pain.  Respiratory: Positive for shortness of breath. Negative for cough.   Cardiovascular: Negative for chest pain and PND.  Gastrointestinal: Positive for nausea and vomiting.  Genitourinary: Negative for dysuria and hematuria.  Musculoskeletal: Positive for myalgias.  Skin: Negative for rash.  Neurological: Negative for focal weakness and loss of consciousness.  Endo/Heme/Allergies: Bruises/bleeds easily.  Psychiatric/Behavioral: The patient is not nervous/anxious.     Past Medical History:  Diagnosis Date  . Anemia of renal disease   . Arthritis    osteoarthritis  . Cancer ([place])    skin- basal- 7 times and location  . Dyspnea    when climbing stairs  . ESRD (end stage renal disease) ([place])    hemodialysis MWF at [facility]  . GERD (gastroesophageal reflux disease)   . Heart murmur    "Nothing to be concerned about, the heart Dr said."  . Hyperkalemia   . Hyperlipidemia   . Hyperphosphatemia   . Hypertension    Essential with goal blood pressure less than 130/80, 12/22/2019-   . Hypokalemia   . Hypoparathyroidism ([place])   . Hypothyroidism   .  Membranous glomerulonephritis   . Proteinuria   . S/P partial hysterectomy   . Seasonal allergies   . Secondary hyperparathyroidism, renal ([place])   . Wears glasses     Past Surgical History:  Procedure Laterality Date  . ABDOMINAL HYSTERECTOMY     PARTIAL  . AV FISTULA PLACEMENT Right 08/06/2017   Procedure: ARTERIOVENOUS (AV) FISTULA  CREATION RIGHT ARM;  Surgeon: Elam Dutch, MD;  Location: [facility] OR;  Service: Vascular;  Laterality: Right;  . AV FISTULA PLACEMENT Right 08/20/2017   Procedure: INSERTION OF ARTERIOVENOUS (AV) GORE-TEX STRETCH GRAFT INTO RIGHT ARM;  Surgeon: Elam Dutch, MD;  Location: [facility] OR;  Service: Vascular;  Laterality: Right;  . AV FISTULA PLACEMENT Left 05/29/2019   Procedure: ARTERIOVENOUS (AV) FISTULA CREATION LEFT ARM;  Surgeon: Serafina Mitchell, MD;  Location: [facility] OR;  Service: Vascular;  Laterality: Left;  . AV FISTULA PLACEMENT Left 12/23/2019   Procedure: INSERTION OF LEFT ARM ARTERIOVENOUS (AV) artegraft;  Surgeon: Serafina Mitchell, MD;  Location: [facility];  Service: Vascular;  Laterality: Left;  . BASCILIC VEIN TRANSPOSITION Left 07/22/2019   Procedure: SECOND STAGE BASILIC VEIN TRANSPOSITION LEFT ARM;  Surgeon: Serafina Mitchell, MD;  [facility]: [facility] OR;  Service: Vascular;  Laterality: Left;  . COLONOSCOPY    . INSERTION OF DIALYSIS CATHETER N/A 08/17/2017   Procedure: INSERTION OF DIALYSIS CATHETER;  Surgeon: Rosetta Posner, MD;  Location: [place];  Service: Vascular;  Laterality: N/A;  . INSERTION OF DIALYSIS CATHETER Right 09/21/2018   Procedure: INSERTION OF DIALYSIS CATHETER;  Surgeon: Marty Heck, MD;  Location: [place];  Service: Vascular;  Laterality: Right;  . REVISION OF ARTERIOVENOUS GORETEX GRAFT Right 07/07/2018   Procedure: REVISION OF ARTERIOVENOUS GORETEX GRAFT;  Surgeon: Elam Dutch, MD;  Location: [place];  Service: Vascular;  Laterality: Right;  . SKIN BIOPSY      SKIN CANCER 6 AREAS  . THROMBECTOMY AND REVISION OF ARTERIOVENTOUS (AV) GORETEX  GRAFT Right 09/17/2018   Procedure: THROMBECTOMY AND REVISION OF ARTERIOVENTOUS (AV) GORETEX  GRAFT;  Surgeon: Serafina Mitchell, MD;  Location: [facility];  Service: Vascular;  Laterality: Right;  . TUBUALIGATION       reports that she quit smoking about 24 years ago. Her smoking use included cigarettes. She has never used smokeless  tobacco. She reports that she does not drink alcohol or use drugs.  No Known Allergies  Family History  Problem Relation Age of Onset  . Stroke Mother   . Heart attack Father   . Heart attack Sister     Prior to Admission medications   Medication Sig Start Date End Date Taking? Authorizing Provider  acetaminophen (TYLENOL) 325 MG tablet Take 325 mg by mouth every 6 (six) hours as needed for mild pain.    [provider]  amLODipine (NORVASC) 10 MG tablet Take 10 mg by mouth at bedtime.  05/11/19   [provider]  atorvastatin (LIPITOR) 80 MG tablet Take 80 mg by mouth daily with breakfast.     [provider]  calcium acetate (PHOSLO) 667 MG capsule Take 2 capsules (1,334 mg total) by mouth 3 (three) times daily with meals. Patient taking differently: Take 2,001 mg by mouth 2 (two) times a day.  08/21/17   Rosita Fire, MD  carvedilol (COREG) 12.5 MG tablet Take 12.5 mg by mouth See admin instructions. Take 1 tablet (12.5mg ) by mouth in the evening ONLY on Tuesdays, Thursdays, & Saturdays. Take 1 tablet (  12.5 mg) by mouth twice daily on Sundays, Mondays, Wednesdays, & Fridays.    [provider]  HYDROcodone-acetaminophen (NORCO/VICODIN) 5-325 MG tablet Take 1 tablet by mouth every 4 (four) hours as needed for moderate pain. 12/23/19 12/22/20  Setzer, Edman Circle, PA-C  levothyroxine (SYNTHROID, LEVOTHROID) 75 MCG tablet Take 75 mcg by mouth daily before breakfast.    [provider]  lidocaine-prilocaine (EMLA) cream Apply 1 application topically See admin instructions. Apply small amount to access site 1 to 2 hours prior to dialysis. 06/30/18   [provider]  loratadine (CLARITIN) 10 MG tablet Take 10 mg by mouth at bedtime.     [provider]  Multiple Vitamin (MULTIVITAMIN WITH MINERALS) TABS tablet Take 1 tablet by mouth daily.     [provider]  omeprazole (PRILOSEC) 20 MG capsule Take 20 mg by mouth 2 (two)  times daily before a meal.     [provider]  raloxifene (EVISTA) 60 MG tablet Take 60 mg by mouth daily with breakfast.     [provider]    Physical Exam:  Constitutional: Elderly female who appears to be in no acute distress at this time. Vitals:   12/28/19 1517  BP: (!) 169/75  Pulse: 96  Resp: 20  Temp: 98.4 F (36.9 C)  TempSrc: Oral  SpO2: 100%   Eyes: PERRL, lids and conjunctivae normal ENMT: Mucous membranes are moist. Posterior pharynx clear of any exudate or lesions.Normal dentition.  Neck: normal, supple, no masses, no thyromegaly Respiratory: Normal respiratory effort with positive crackles heard in the mid to lower lung fields.  No significant wheezing appreciated.  Patient currently on 2 L nasal cannula oxygen and able to talk in complete sentences. Cardiovascular: Positive murmur 2/6.  No extremity edema. 2+ pedal pulses. No carotid bruits.  Hemodialysis catheter of the left chest wall.  Left forearm AV fistula with palpable thrill. Abdomen: no tenderness, no masses palpated. No hepatosplenomegaly. Bowel sounds positive.  Musculoskeletal: no clubbing / cyanosis. No joint deformity upper and lower extremities. Good ROM, no contractures. Normal muscle tone.  Skin: Swelling and bruising noted around left forearm where the AV fistula was placed.  No significant erythema appreciated. Neurologic: CN 2-12 grossly intact. Sensation intact, DTR normal. Strength 5/5 in all 4.  Psychiatric: Normal judgment and insight. Alert and oriented x 3. Normal mood.     Labs on Admission: I have personally reviewed following labs and imaging studies  CBC: Recent Labs  Lab 12/23/19 0648  HGB 10.5*  HCT 54.6*   Basic Metabolic Panel: Recent Labs  Lab 12/23/19 0648  NA 138  K 4.6  CL 97*  GLUCOSE 114*  BUN 20  CREATININE 4.50*   GFR: Estimated Creatinine Clearance: 11.1 mL/min (A) (by C-G formula based on SCr of 4.5 mg/dL (H)). Liver Function  Tests: No results for input(s): AST, ALT, ALKPHOS, BILITOT, PROT, ALBUMIN in the last 168 hours. No results for input(s): LIPASE, AMYLASE in the last 168 hours. No results for input(s): AMMONIA in the last 168 hours. Coagulation Profile: No results for input(s): INR, PROTIME in the last 168 hours. Cardiac Enzymes: No results for input(s): CKTOTAL, CKMB, CKMBINDEX, TROPONINI in the last 168 hours. BNP (last 3 results) No results for input(s): PROBNP in the last 8760 hours. HbA1C: No results for input(s): HGBA1C in the last 72 hours. CBG: No results for input(s): GLUCAP in the last 168 hours. Lipid Profile: No results for input(s): CHOL, HDL, LDLCALC, TRIG, CHOLHDL, LDLDIRECT in  the last 72 hours. Thyroid Function Tests: No results for input(s): TSH, T4TOTAL, FREET4, T3FREE, THYROIDAB in the last 72 hours. Anemia Panel: No results for input(s): VITAMINB12, FOLATE, FERRITIN, TIBC, IRON, RETICCTPCT in the last 72 hours. Urine analysis: No results found for: COLORURINE, APPEARANCEUR, LABSPEC, PHURINE, GLUCOSEU, HGBUR, BILIRUBINUR, KETONESUR, PROTEINUR, UROBILINOGEN, NITRITE, LEUKOCYTESUR Sepsis Labs: Recent Results (from the past 240 hour(s))  SARS CORONAVIRUS 2 (TAT 6-24 HRS) Nasopharyngeal Nasopharyngeal Swab     Status: None   Collection Time: 12/21/19 11:54 AM   Specimen: Nasopharyngeal Swab  Result Value Ref Range Status   SARS Coronavirus 2 NEGATIVE NEGATIVE Final    Comment: (NOTE) SARS-CoV-2 target nucleic acids are NOT DETECTED. The SARS-CoV-2 RNA is generally detectable in upper and lower respiratory specimens during the acute phase of infection. Negative results do not preclude SARS-CoV-2 infection, do not rule out co-infections with other pathogens, and should not be used as the sole basis for treatment or other patient management decisions. Negative results must be combined with clinical observations, patient history, and epidemiological information. The expected result  is Negative. Fact Sheet for Patients: SugarRoll.be Fact Sheet for Healthcare Providers: https://www.woods-mathews.com/ This test is not yet approved or cleared by the [place] FDA and  has been authorized for detection and/or diagnosis of SARS-CoV-2 by FDA under an Emergency Use Authorization (EUA). This EUA will remain  in effect (meaning this test can be used) for the duration of the COVID-19 declaration under Section 56 4(b)(1) of the Act, 21 U.S.C. section 360bbb-3(b)(1), unless the authorization is terminated or revoked sooner. Performed at [facility], [place] [address], [place], [place] [address]      Radiological Exams on Admission: No results found.   Assessment/Plan Fever/SIRS: Patient noted to be febrile up to 102 F per review of records with tachycardia and tachypnea.  WBC and lactic acid at the outside facility were within normal limits.Covid-19 and Influenza screening was negative.  Blood cultures were obtained, but still pending at this time.  Chest x-ray signs of possible pneumonia, but patient denies having any complaints of cough.  Patient had been empirically started on antibiotics of cefepime and azithromycin.  Question possibility of infection of the graft. -Admit to a medical telemetry bed -Follow-up blood cultures at [facility] blood cultures ordered by vascular surgery -Continue empiric antibiotics of vancomycin and cefepime -Tylenol as needed for fever  Acute respiratory failure with hypoxia secondary to fluid overload in ESRD on HD: At the outside facility patient was noted to be hypoxic down to 80% on room air.  Currently O2 saturation maintained on 2 L of nasal cannula oxygen.  Chest x-ray from outside facility noted signs of pulmonary edema likely due to recently missed hemodialysis session. -Continue nasal cannula oxygen and wean to room air when able -Check renal function panel -Appreciate  nephrology consultative services, will follow-up for further recommendations    AV fistula graft: Just recently placed by Dr. Trula Slade on 2/10.  Patient complaining of soreness and swelling at the graft site.  Evaluated by vascular ultrasound at the outside facility, but no signs of a clot noted. -Continue hydrocodone as needed for pain -Appreciate vascular surgery consultative services, will follow-up for any additional recommendations  Anemia of chronic disease: Hemoglobin at outside facility noted to be 9.3 yesterday at [facility], but repeat check 8.5.  Patient denies any reports of bleeding. -Recheck CBC in a.m.  Essential hypertension: Home medications include amlodipine 10 mg daily and Coreg 12.5 mg twice  daily on non-HD days, and Coreg 12.5 once daily on HD days. -Continue home regimen  Hypothyroidism -Continue levothyroxine  Hyperlipidemia -Continue atorvastatin  DVT prophylaxis: Heparin Code Status: Full Family Communication: No family present at bedside Disposition Plan: Possible discharge home in 2-3 days  Consults called: Vascular surgery Admission status: Inpatient  Norval Morton MD [facility] Pager [phone]   If 7PM-7AM, please contact night-coverage [email or web address] Password [facility]  12/28/2019, 3:32 PM

## 2019-12-28 NOTE — Consult Note (Addendum)
Reason for Consult: To manage dialysis and dialysis related needs Referring Physician: Dr Harvest Forest  Latoya Lopez is an 79 y.o. female.  HPI: pt is a 43F with a PMH of ESRD on HD TTS, HTN, HLD, and skin cancer who is now seen in consultation at the request of Dr. Tamala Julian for management of ESRD and provision of dialysis.  Pt was in her usual state of health until Saturday.  Missed Saturday HD d/t loose bowels.  Presented to [facility] hospital today for n/v and SOB, ran a low-grade fever.  COVID testing negative, CXR reportedly with signs of volume overload; cultures drawn and antibiotics started per report.  Recently had AVG placed 12/23/19.  Concern for sepsis, was transferred to [facility] for evaluation and management.    Pt reports that her L arm is swollen and sore.  No more n/v, feeling somewhat better.  Asking for something to eat.  Dialyzes at [facility] TTS 3 hr 30 min EDW 69 kg F180  2K 2.25 Ca  UF profile 2 TDC  Heparin 5000 u bolus Venofer 100 q rx x 5, has not started yet Mircera 100 mcg q 2 weeks, last given 12/15/19.  Past Medical History:  Diagnosis Date  . Anemia of renal disease   . Arthritis    osteoarthritis  . Cancer ([place])    skin- basal- 7 times and location  . Dyspnea    when climbing stairs  . ESRD (end stage renal disease) ([place])    hemodialysis MWF at [facility]  . GERD (gastroesophageal reflux disease)   . Heart murmur    "Nothing to be concerned about, the heart Dr said."  . Hyperkalemia   . Hyperlipidemia   . Hyperphosphatemia   . Hypertension    Essential with goal blood pressure less than 130/80, 12/22/2019-   . Hypokalemia   . Hypoparathyroidism ([place])   . Hypothyroidism   . Membranous glomerulonephritis   . Proteinuria   . S/P partial hysterectomy   . Seasonal allergies   . Secondary hyperparathyroidism, renal ([place])   . Wears glasses     Past Surgical History:  Procedure Laterality Date  . ABDOMINAL HYSTERECTOMY     PARTIAL  . AV FISTULA  PLACEMENT Right 08/06/2017   Procedure: ARTERIOVENOUS (AV) FISTULA CREATION RIGHT ARM;  Surgeon: Elam Dutch, MD;  Location: [facility] OR;  Service: Vascular;  Laterality: Right;  . AV FISTULA PLACEMENT Right 08/20/2017   Procedure: INSERTION OF ARTERIOVENOUS (AV) GORE-TEX STRETCH GRAFT INTO RIGHT ARM;  Surgeon: Elam Dutch, MD;  Location: [facility] OR;  Service: Vascular;  Laterality: Right;  . AV FISTULA PLACEMENT Left 05/29/2019   Procedure: ARTERIOVENOUS (AV) FISTULA CREATION LEFT ARM;  Surgeon: Serafina Mitchell, MD;  [facility] OR;  Service: Vascular;  Laterality: Left;  . AV FISTULA PLACEMENT Left 12/23/2019   Procedure: INSERTION OF LEFT ARM ARTERIOVENOUS (AV) artegraft;  Surgeon: Serafina Mitchell, MD;  Location: [place];  Service: Vascular;  Laterality: Left;  . BASCILIC VEIN TRANSPOSITION Left 07/22/2019   Procedure: SECOND STAGE BASILIC VEIN TRANSPOSITION LEFT ARM;  Surgeon: Serafina Mitchell, MD;  [facility] OR;  Service: Vascular;  Laterality: Left;  . COLONOSCOPY    . INSERTION OF DIALYSIS CATHETER N/A 08/17/2017   Procedure: INSERTION OF DIALYSIS CATHETER;  Surgeon: Rosetta Posner, MD;  Location: [place];  Service: Vascular;  Laterality: N/A;  . INSERTION OF DIALYSIS CATHETER Right 09/21/2018   Procedure: INSERTION OF DIALYSIS CATHETER;  Surgeon: Monica Martinez  J, MD;  [facility] OR;  Service: Vascular;  Laterality: Right;  . REVISION OF ARTERIOVENOUS GORETEX GRAFT Right 07/07/2018   Procedure: REVISION OF ARTERIOVENOUS GORETEX GRAFT;  Surgeon: Elam Dutch, MD;  Location: [place];  Service: Vascular;  Laterality: Right;  . SKIN BIOPSY      SKIN CANCER 6 AREAS  . THROMBECTOMY AND REVISION OF ARTERIOVENTOUS (AV) GORETEX  GRAFT Right 09/17/2018   Procedure: THROMBECTOMY AND REVISION OF ARTERIOVENTOUS (AV) GORETEX  GRAFT;  Surgeon: Serafina Mitchell, MD;  Location: [place] OR;  Service: Vascular;  Laterality: Right;  . TUBUALIGATION      Family History  Problem Relation Age of Onset  .  Stroke Mother   . Heart attack Father   . Heart attack Sister     Social History:  reports that she quit smoking about 24 years ago. Her smoking use included cigarettes. She has never used smokeless tobacco. She reports that she does not drink alcohol or use drugs.  Allergies: No Known Allergies  Medications: Scheduled:  Medications reveiwed in Epic  ROS: all other systems reviewed and are negative except as per HPI Blood pressure (!) 169/75, pulse 96, temperature 98.4 F (36.9 C), temperature source Oral, resp. rate 20, SpO2 100 %. .  GEN NAD, lying in bed HEENT EOMI PERRL wearing mask NECK + JVD PULM slightly increased WOB, clear anteriorly, muffled bases CV RRR no m/r/g ABD soft, nontender, NABS EXT no LE edema NEURO AAO x 3 no asterixis ACCESS  Has TDC with dressing c/d/i, LUE AVG + T/B, swollen and erythematous L arm with some serous drainage from one of the incisions.  No dehiscence noted  Assessment/Plan: 1 Fever/ swollen L arm- concern for AVG infection vs graft reaction, recently placed 2/10.  Cultures and antibiotics per primary.  VVS has been consulted, appreciate assistance. 2.  Acute hypoxic RF: volume component, expect to improve with UF- COVID test at [facility] negative.  Got the first COVID vaccine Thursday 3 ESRD: TTS [place]- missed HD Saturday, will do HD tonight as requiring some O2 4 Hypertension: reasonably well-controlled 5. Anemia of ESRD: ESA to be administered next HD, will hold off on iron in the setting of possible infection 6. Metabolic Bone Disease: binders when eating 7.  Dispo: pending  Madelon Lips 12/28/2019, 4:06 PM

## 2019-12-29 ENCOUNTER — Other Ambulatory Visit: Payer: Self-pay

## 2019-12-29 ENCOUNTER — Encounter (HOSPITAL_COMMUNITY): Payer: Self-pay | Admitting: Internal Medicine

## 2019-12-29 LAB — RENAL FUNCTION PANEL
Albumin: 2.8 g/dL — ABNORMAL LOW (ref 3.5–5.0)
Anion gap: 16 — ABNORMAL HIGH (ref 5–15)
BUN: 24 mg/dL — ABNORMAL HIGH (ref 8–23)
CO2: 24 mmol/L (ref 22–32)
Calcium: 8.4 mg/dL — ABNORMAL LOW (ref 8.9–10.3)
Chloride: 97 mmol/L — ABNORMAL LOW (ref 98–111)
Creatinine, Ser: 4.34 mg/dL — ABNORMAL HIGH (ref 0.44–1.00)
GFR calc Af Amer: 11 mL/min — ABNORMAL LOW (ref >60)
GFR calc non Af Amer: 9 mL/min — ABNORMAL LOW (ref >60)
Glucose, Bld: 103 mg/dL — ABNORMAL HIGH (ref 70–99)
Phosphorus: 3.1 mg/dL (ref 2.5–4.6)
Potassium: 3.6 mmol/L (ref 3.5–5.1)
Sodium: 137 mmol/L (ref 135–145)

## 2019-12-29 MED ORDER — SODIUM CHLORIDE 0.9 % IV SOLN: 2.0000 g | INTRAVENOUS | Status: DC

## 2019-12-29 MED ORDER — DARBEPOETIN ALFA 100 MCG/0.5ML IJ SOSY
100.0000 ug | PREFILLED_SYRINGE | Freq: Once | INTRAMUSCULAR | Status: AC
  Administered 2019-12-29: 100 ug via SUBCUTANEOUS
  Filled 2019-12-29: qty 0.5

## 2019-12-29 MED ORDER — VANCOMYCIN HCL IN DEXTROSE 750-5 MG/150ML-% IV SOLN: 750.0000 mg | INTRAVENOUS | Status: DC

## 2019-12-29 NOTE — Progress Notes (Signed)
PROGRESS NOTE    Latoya Lopez  TFT:[id number] DOB: 1941/05/14 DOA: 12/28/2019 PCP: [place]   Brief Narrative:  Patient is a 79 year old female with history of hypertension, hyperlipidemia, ESRD on hemodialysis on TTS schedule, hypothyroidism, GERD who presented to the [facility] with complaints of shortness of breath with nausea and vomiting.  She recently had an AV fistula graft placed on the left arm by Dr. Trula Slade on 2/10.  Since the procedure, she reported soreness on the arm, increased swelling.  Her last hemodialysis session was on 2/11 and she missed her hemodialysis on 2/13 due to diarrhea.  2 degree fever, tachycardia, tachypnea and saturating 80% room air.  Chest x-ray increased vascular congestion, showed  diffuse interstitial/hazy pulmonary opacity concerning for edema but also confluent opacity on the right lung base concerning for atelectasis versus pneumonia..  Started on broad start antibiotics for suspicion of sepsis secondary to PNA vs  graft infection  Nephrology and vascular surgery consulted and following.  Assessment & Plan:   Principal Problem:   Fever Active Problems:   ESRD (end stage renal disease) ([place])   Hypothyroidism   Anemia of chronic disease   Hyperlipidemia   Acute respiratory failure with hypoxia (HCC)   Fever/sepsis: Noted to be febrile on presentation with tachycardia, tachypnea.  No leukocytosis or lactic acidosis.  Covid-19/influenza screen negative.  Blood cultures have been obtained.  Started on broad-spectrum antibiotics for the suspicion of pneumonia, sepsis versus graft infection.  Vascular surgery has ruled out graft infection.  We will continue current antibiotics for today because she was febrile on presentation with fever of 102 F.  We will deescalate the abx as soon as appropriate,may be tomorrow  Acute respiratory failure with hypoxia: Secondary to volume overload due to ESRD.  She was noted to be hypoxic on  room air.  During my evaluation, 2 L of oxygen per minute.  The x-ray showed possible pulmonary edema.  Continue to monitor respiratory status.  She does not use oxygen at home.  Will monitor her on room air.  ESRD on hemodialysis: TTS schedule.  Nephrology already following.  Missed dialysis on Saturday.She was dialyzed last night.  Nephrology planning to reevaluate tomorrow for possible dialysis.  AV fistula/graft soreness/swelling: Recently placed by Dr. Trula Slade on 2/10.  Complains of soreness/swelling at the graft site.  Evaluated by vascular ultrasound at the outside facility, no signs of clot noted.  Continue pain management.  Vascular surgery said there is no sign of infection and they can follow-up as an outpatient.  Normocytic anemia of chronic disease: Associated with ESRD.Currently H&H stable.  Hypertension: Continue current medications.  Monitor blood pressure.  Hypothyroidism: Continue levothyroxine  Hyperlipidemia: Continue Lipitor           DVT prophylaxis:[place] heparin Code Status: Full Family Communication: called daughter on phone Disposition Plan: Patient is from home.  This morning she was still on oxygen, on IV antibiotics.  Will evaluate her tomorrow for possible discharge.  Nephrology has not signed off   Consultants: Vascular surgery, nephrology  Procedures:  Antimicrobials:  Anti-infectives (From admission, onward)   Start     Dose/Rate Route Frequency Ordered Stop   12/29/19 1800  ceFEPIme (MAXIPIME) 2 g in sodium chloride 0.9 % 100 mL IVPB     2 g 200 mL/hr over 30 Minutes Intravenous Every T-Th-Sa (1800) 12/28/19 1922     12/29/19 1200  vancomycin (VANCOCIN) IVPB 750 mg/150 ml premix     750 mg  150 mL/hr over 60 Minutes Intravenous Every T-Th-Sa (Hemodialysis) 12/28/19 1922     12/29/19 0000  ceFEPIme (MAXIPIME) 2 g in sodium chloride 0.9 % 100 mL IVPB     2 g 200 mL/hr over 30 Minutes Intravenous  Once 12/28/19 1922 12/29/19 0106   12/28/19 2000   ceFEPIme (MAXIPIME) 2 g in sodium chloride 0.9 % 100 mL IVPB  Status:  Discontinued     2 g 200 mL/hr over 30 Minutes Intravenous  Once 12/28/19 1716 12/28/19 1955   12/28/19 1900  vancomycin (VANCOREADY) IVPB 1500 mg/300 mL     1,500 mg 150 mL/hr over 120 Minutes Intravenous  Once 12/28/19 1849 12/29/19 0426   12/28/19 1730  vancomycin (VANCOCIN) IVPB 1000 mg/200 mL premix  Status:  Discontinued     1,000 mg 200 mL/hr over 60 Minutes Intravenous  Once 12/28/19 1716 12/28/19 1849      Subjective:  Patient seen and examined at the bedside this morning.  Currently hemodynamically stable.  She feels much better.  Afebrile, blood pressure stable.  She was seen on 2 L of oxygen per minute.  She does not use oxygen at home.  Objective: Vitals:   12/28/19 2330 12/29/19 0000 12/29/19 0027 12/29/19 0413  BP: 139/70 (!) 154/77 125/64 (!) 154/74  Pulse: 86 92 92 95  Resp:  18 18 16   Temp:  98.3 F (36.8 C) 97.8 F (36.6 C) 97.9 F (36.6 C)  TempSrc:  Oral Oral Oral  SpO2:  100% 99% 98%  Weight:  71.1 kg 71.9 kg     Intake/Output Summary (Last 24 hours) at 12/29/2019 0817 Last data filed at 12/29/2019 1194 Gross per 24 hour  Intake 147.67 ml  Output 2269 ml  Net -2121.33 ml   Filed Weights   12/28/19 2001 12/29/19 0000 12/29/19 0027  Weight: 73.3 kg 71.1 kg 71.9 kg    Examination:  General exam:Not in [place] built, elderly pleasant female Respiratory system: Diminished air sounds on the bases, no wheezes or crackles  Cardiovascular system: S1 & S2 heard, RRR. No JVD, murmurs, rubs, gallops or clicks. No pedal edema. Gastrointestinal system: Abdomen is nondistended, soft and nontender. No organomegaly or masses felt. Normal bowel sounds heard. Central nervous system: Alert and oriented. No focal neurological deficits. Extremities: No edema, no clubbing ,no cyanosis, AV graft on the left forearm with  mild edema,no tenderness or erythema  skin: No rashes, lesions or  ulcers,no icterus ,no pallor     Data Reviewed: I have personally reviewed following labs and imaging studies  CBC: Recent Labs  Lab 12/23/19 0648 12/28/19 1659  WBC  --  6.8  HGB 10.5* 8.5*  HCT 31.0* 28.0*  MCV  --  98.9  PLT  --  174*   Basic Metabolic Panel: Recent Labs  Lab 12/23/19 0648 12/28/19 1659 12/29/19 0145  NA 138 141 137  K 4.6 5.5* 3.6  CL 97* 102 97*  CO2  --  21* 24  GLUCOSE 114* 124* 103*  BUN 20 83* 24*  CREATININE 4.50* 9.84* 4.34*  CALCIUM  --  8.9 8.4*  PHOS  --  5.8* 3.1   GFR: Estimated Creatinine Clearance: 11.5 mL/min (A) (by C-G formula based on SCr of 4.34 mg/dL (H)). Liver Function Tests: Recent Labs  Lab 12/28/19 1659 12/29/19 0145  ALBUMIN 3.1* 2.8*   No results for input(s): LIPASE, AMYLASE in the last 168 hours. No results for input(s): AMMONIA in the last 168 hours. Coagulation Profile: No results  for input(s): INR, PROTIME in the last 168 hours. Cardiac Enzymes: No results for input(s): CKTOTAL, CKMB, CKMBINDEX, TROPONINI in the last 168 hours. BNP (last 3 results) No results for input(s): PROBNP in the last 8760 hours. HbA1C: No results for input(s): HGBA1C in the last 72 hours. CBG: No results for input(s): GLUCAP in the last 168 hours. Lipid Profile: No results for input(s): CHOL, HDL, LDLCALC, TRIG, CHOLHDL, LDLDIRECT in the last 72 hours. Thyroid Function Tests: No results for input(s): TSH, T4TOTAL, FREET4, T3FREE, THYROIDAB in the last 72 hours. Anemia Panel: No results for input(s): VITAMINB12, FOLATE, FERRITIN, TIBC, IRON, RETICCTPCT in the last 72 hours. Sepsis Labs: No results for input(s): PROCALCITON, LATICACIDVEN in the last 168 hours.  Recent Results (from the past 240 hour(s))  SARS CORONAVIRUS 2 (TAT 6-24 HRS) Nasopharyngeal Nasopharyngeal Swab     Status: None   Collection Time: 12/21/19 11:54 AM   Specimen: Nasopharyngeal Swab  Result Value Ref Range Status   SARS Coronavirus 2 NEGATIVE NEGATIVE  Final    Comment: (NOTE) SARS-CoV-2 target nucleic acids are NOT DETECTED. The SARS-CoV-2 RNA is generally detectable in upper and lower respiratory specimens during the acute phase of infection. Negative results do not preclude SARS-CoV-2 infection, do not rule out co-infections with other pathogens, and should not be used as the sole basis for treatment or other patient management decisions. Negative results must be combined with clinical observations, patient history, and epidemiological information. The expected result is Negative. Fact Sheet for Patients: SugarRoll.be Fact Sheet for Healthcare Providers: https://www.woods-mathews.com/ This test is not yet approved or cleared by the [place] FDA and  has been authorized for detection and/or diagnosis of SARS-CoV-2 by FDA under an Emergency Use Authorization (EUA). This EUA will remain  in effect (meaning this test can be used) for the duration of the COVID-19 declaration under Section 56 4(b)(1) of the Act, 21 U.S.C. section 360bbb-3(b)(1), unless the authorization is terminated or revoked sooner. Performed at [facility], [place] [address] [place], [place] [address]   SARS CORONAVIRUS 2 (TAT 6-24 HRS) Nasopharyngeal Nasopharyngeal Swab     Status: None   Collection Time: 12/28/19  4:57 PM   Specimen: Nasopharyngeal Swab  Result Value Ref Range Status   SARS Coronavirus 2 NEGATIVE NEGATIVE Final    Comment: (NOTE) SARS-CoV-2 target nucleic acids are NOT DETECTED. The SARS-CoV-2 RNA is generally detectable in upper and lower respiratory specimens during the acute phase of infection. Negative results do not preclude SARS-CoV-2 infection, do not rule out co-infections with other pathogens, and should not be used as the sole basis for treatment or other patient management decisions. Negative results must be combined with clinical observations, patient history, and epidemiological  information. The expected result is Negative. Fact Sheet for Patients: SugarRoll.be Fact Sheet for Healthcare Providers: https://www.woods-mathews.com/ This test is not yet approved or cleared by the [place] FDA and  has been authorized for detection and/or diagnosis of SARS-CoV-2 by FDA under an Emergency Use Authorization (EUA). This EUA will remain  in effect (meaning this test can be used) for the duration of the COVID-19 declaration under Section 56 4(b)(1) of the Act, 21 U.S.C. section 360bbb-3(b)(1), unless the authorization is terminated or revoked sooner. Performed at [facility], [place] [address] [place], [place] [address]   Culture, blood (single) w Reflex to ID Panel     Status: None (Preliminary result)   Collection Time: 12/28/19  5:20 PM   Specimen: BLOOD RIGHT WRIST  Result Value Ref Range Status   Specimen Description BLOOD RIGHT WRIST  Final   Special Requests   Final    BOTTLES DRAWN AEROBIC ONLY Blood Culture adequate volume   Culture   Final    NO GROWTH < 24 HOURS Performed at [facility], [address], [place], [place] [address]    Report Status PENDING  Incomplete  Culture, blood (single) w Reflex to ID Panel     Status: None (Preliminary result)   Collection Time: 12/28/19  6:35 PM   Specimen: BLOOD RIGHT HAND  Result Value Ref Range Status   Specimen Description BLOOD RIGHT HAND  Final   Special Requests   Final    BOTTLES DRAWN AEROBIC AND ANAEROBIC Blood Culture adequate volume   Culture   Final    NO GROWTH < 12 HOURS Performed at [facility], [place] [address], [place], [place] [address]    Report Status PENDING  Incomplete         Radiology Studies: No results found.      Scheduled Meds: . amLODipine  10 mg Oral QHS  . atorvastatin  80 mg Oral Q breakfast  . calcium acetate  2,001 mg Oral BID WC  . carvedilol  12.5 mg Oral Once per day on Tue Thu Sat   And  .  carvedilol  12.5 mg Oral 2 times per day on Sun Mon Wed Fri  . Chlorhexidine Gluconate Cloth  6 each Topical Q0600  . darbepoetin (ARANESP) injection - DIALYSIS  60 mcg Intravenous Q Mon-HD  . heparin  5,000 Units Subcutaneous Q8H  . levothyroxine  75 mcg Oral QAC breakfast  . lidocaine-prilocaine  1 application Topical [id number]  . pantoprazole  40 mg Oral BID  . raloxifene  60 mg Oral Q breakfast  . sodium chloride flush  3 mL Intravenous Q12H   Continuous Infusions: . sodium chloride    . sodium chloride    . ceFEPime (MAXIPIME) IV    . vancomycin       LOS: 1 day    Time spent: 35 mins. More than 50% of that time was spent in counseling and/or coordination of care.      Shelly Coss, MD [facility] P2/16/2021, 8:17 AM

## 2019-12-29 NOTE — Progress Notes (Signed)
Status post left forearm dialysis graft with Artegraft She feels that the swelling and discomfort are less today after keeping her arm elevated. On examination there is ecchymosis without erythema.  There is no warmth.  I suspect the patient's fevers were unrelated to her graft.  I do not feel that this is infected at this time.  The discoloration is likely related to tissue damage from the tunneling.  She may have some component of venous outflow stenosis given the swelling in her arm, however I would not want to evaluate this for several weeks.  From my perspective, she can be discharged home and I will have her follow-up in the office for wound check in approximately 2 weeks  Wells Rankin Coolman

## 2019-12-29 NOTE — Progress Notes (Signed)
[facility] Progress Note   Subjective:  Seen in room - chatting on phone with friend. No CP/dyspnea. Not using O2 at this time. Breathing improved s/p overnight HD with 2.3L UF. Says L forearm edema is improved somewhat, but remains very tender.   Objective Vitals:   12/29/19 0000 12/29/19 0027 12/29/19 0413 12/29/19 1007  BP: (!) 154/77 125/64 (!) 154/74 (!) 146/67  Pulse: 92 92 95 88  Resp: 18 18 16 17   Temp: 98.3 F (36.8 C) 97.8 F (36.6 C) 97.9 F (36.6 C) 98.7 F (37.1 C)  TempSrc: Oral Oral Oral Oral  SpO2: 100% 99% 98% 99%  Weight: 71.1 kg 71.9 kg     Physical Exam General: Elderly woman, NAD. Room air at the moment Heart: RRR; 2/6 murmur Lungs: CTAB Extremities: No LE edema Dialysis Access: L forearm with patent AVG, diffuse edema. Sutures/staples in place.  Additional Objective Labs: Basic Metabolic Panel: Recent Labs  Lab 12/23/19 0648 12/28/19 1659 12/29/19 0145  NA 138 141 137  K 4.6 5.5* 3.6  CL 97* 102 97*  CO2  --  21* 24  GLUCOSE 114* 124* 103*  BUN 20 83* 24*  CREATININE 4.50* 9.84* 4.34*  CALCIUM  --  8.9 8.4*  PHOS  --  5.8* 3.1   Liver Function Tests: Recent Labs  Lab 12/28/19 1659 12/29/19 0145  ALBUMIN 3.1* 2.8*   CBC: Recent Labs  Lab 12/23/19 0648 12/28/19 1659  WBC  --  6.8  HGB 10.5* 8.5*  HCT 31.0* 28.0*  MCV  --  98.9  PLT  --  141*   Blood Culture    Component Value Date/Time   SDES BLOOD RIGHT HAND 12/28/2019 1835   SPECREQUEST  12/28/2019 1835    BOTTLES DRAWN AEROBIC AND ANAEROBIC Blood Culture adequate volume   CULT  12/28/2019 1835    NO GROWTH < 12 HOURS Performed at [facility], [place] [address], [place], [place] [address]    REPTSTATUS PENDING 12/28/2019 1835   Medications: . sodium chloride    . sodium chloride    . ceFEPime (MAXIPIME) IV    . vancomycin     . amLODipine  10 mg Oral QHS  . atorvastatin  80 mg Oral Q breakfast  . calcium acetate  2,001 mg Oral BID WC  .  carvedilol  12.5 mg Oral Once per day on Tue Thu Sat   And  . carvedilol  12.5 mg Oral 2 times per day on Sun Mon Wed Fri  . Chlorhexidine Gluconate Cloth  6 each Topical Q0600  . darbepoetin (ARANESP) injection - DIALYSIS  60 mcg Intravenous Q Mon-HD  . heparin  5,000 Units Subcutaneous Q8H  . levothyroxine  75 mcg Oral QAC breakfast  . lidocaine-prilocaine  1 application Topical [id number]  . pantoprazole  40 mg Oral BID  . raloxifene  60 mg Oral Q breakfast  . sodium chloride flush  3 mL Intravenous Q12H    Dialysis Orders: TTS at [facility] 3:30hr, EDW 69kg, 2K/2.25Ca, UFP #2, TDC, heparin 5000 - Mircera 157mcg IV q 2 weeks (last 12/15/19) - Venofer 100 x 5 ordered, not given yet.  Assessment/Plan: 1. Fever/swollen L arm: New L AVG placed 2/10 -> infection v. gore-tex reaction. VVS following. BCx 2/15 pending. On Vanc/Cefepime. 2. Acute hypoxic resp failure: Dyspneic on admit - dialyzed last night with 2.3L UF. Breathing improved today. 3. ESRD: TTS sched. Dialyzed "early" last night d/t #2. Not quite to EDW, but  on room air now. Offered another HD today, but she thinks will be fine until Thursday - will re-eval in AM. 4. HTN/volume: BP slightly high - will try to get down to EDW with next dialysis.  5. Anemia: Hgb 8.5. Aranesp ordered for 2/15 - looks like was missed - will re-order for today. 6. Secondary hyperparathyroidism: Ca/Phos ok - continue binders (Phoslo)   Veneta Penton, PA-C 12/29/2019, 11:41 AM  [facility] Pager: [phone]

## 2019-12-29 NOTE — TOC Initial Note (Signed)
Transition of Care [facility]) - Initial/Assessment Note    Patient Details  Name: AKELIA HUSTED MRN: [id number] Date of Birth: Feb 24, 1941  [facility]) CM/SW Contact:    Alexander Mt, LCSW Phone Number:  12/29/2019, 12:32 PM  Clinical Narrative:                 CSW spoke with pt at bedside. Introduced self, role, reason for visit. Pt from home with her spouse and daughter. Confirmed home address and PCP. She dialyzes on TThSa at [facility]. She denies issues with obtaining or affording medications and her husband drives her to dialysis and appointments. Pt only complaint is about her swollen fistula site. She usually ambulates with a rollator at home, no other DME needs identified at this time. Pt very pleasant, worked in the [employer] prior to retirement. Pt family is supportive and she states that she has great support from staff at dialysis center. Pt aware TOC team remains available for pt needs.   Expected Discharge Plan: Home/Self Care Barriers to Discharge: Continued Medical Work up   Patient Goals and CMS Choice Patient states their goals for this hospitalization and ongoing recovery are:: return home when ready, hoping her arm starts to feel better CMS Medicare.gov Compare Post Acute Care list provided to:: Other (Comment Required)(n/a) Choice offered to / list presented to : Patient  Expected Discharge Plan and Services Expected Discharge Plan: Home/Self Care In-house Referral: Clinical Social Work Discharge Planning Services: CM Consult Living arrangements for the past 2 months: Single Family Home   Prior Living Arrangements/Services Living arrangements for the past 2 months: Single Family Home Lives with:: Spouse, Adult Children Patient language and need for interpreter reviewed:: Yes(no needs) Do you feel safe going back to the place where you live?: Yes      Need for Family Participation in Patient Care: Yes (Comment)(assistance with som  daily cares; transportation) Care giver support system in place?: Yes (comment)(pt spouse and adult daughter) Current home services: DME Criminal Activity/Legal Involvement Pertinent to Current Situation/Hospitalization: No - Comment as needed  Activities of Daily Living Home Assistive Devices/Equipment: None ADL Screening (condition at time of admission) Patient's cognitive ability adequate to safely complete daily activities?: Yes Is the patient deaf or have difficulty hearing?: No Does the patient have difficulty seeing, even when wearing glasses/contacts?: No Does the patient have difficulty concentrating, remembering, or making decisions?: No Patient able to express need for assistance with ADLs?: Yes Does the patient have difficulty dressing or bathing?: No Independently performs ADLs?: Yes (appropriate for developmental age) Does the patient have difficulty walking or climbing stairs?: Yes Weakness of Legs: Both Weakness of Arms/Hands: None  Permission Sought/Granted Permission sought to share information with : Family Supports Permission granted to share information with : Yes, Verbal Permission Granted  Share Information with NAME: Bobie Caris     Permission granted to share info w Relationship: spouse  Permission granted to share info w Contact Information: [phone]  Emotional Assessment Appearance:: Appears stated age Attitude/Demeanor/Rapport: Gracious, Engaged Affect (typically observed): Accepting, Adaptable, Appropriate, Pleasant Orientation: : Oriented to Self, Oriented to Place, Oriented to Situation Alcohol / Substance Use: Not Applicable Psych Involvement: No (comment)  Admission diagnosis:  Fever [R50.9] Patient Active Problem List   Diagnosis Date Noted   Fever 12/28/2019   Anemia of chronic disease 12/28/2019   Hyperlipidemia 12/28/2019   Acute respiratory failure with hypoxia (HCC) 12/28/2019   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 (  [place]) 09/20/2018   ESRD (end stage renal disease) ([place]) 08/17/2017   HTN (hypertension), benign 08/17/2017   Hypothyroidism 08/17/2017   Acute congestive heart failure ([place])    Pleural effusion    PCP:  [place]:   [id number] - , [place] [address] [place] [address] Phone: [phone] Fax: [facility] [place], [place] [address] Phone: [phone] Fax: [phone]   Readmission Risk Interventions Readmission Risk Prevention Plan 12/29/2019  Transportation Screening Complete  PCP or Specialist Appt within 5-7 Days Not Complete  Not Complete comments pending medical stability  Home Care Screening Complete  Medication Review (RN CM) Referral to Pharmacy  Some recent data might be hidden

## 2019-12-30 DIAGNOSIS — A419 Sepsis, unspecified organism: Secondary | ICD-10-CM

## 2019-12-30 LAB — CBC WITH DIFFERENTIAL/PLATELET
Abs Immature Granulocytes: 0.04 10*3/uL (ref 0.00–0.07)
Basophils Absolute: 0.1 10*3/uL (ref 0.0–0.1)
Basophils Relative: 1 %
Eosinophils Absolute: 0.4 10*3/uL (ref 0.0–0.5)
Eosinophils Relative: 6 %
HCT: 26.6 % — ABNORMAL LOW (ref 36.0–46.0)
Hemoglobin: 8.2 g/dL — ABNORMAL LOW (ref 12.0–15.0)
Immature Granulocytes: 1 %
Lymphocytes Relative: 25 %
Lymphs Abs: 1.7 10*3/uL (ref 0.7–4.0)
MCH: 30.3 pg (ref 26.0–34.0)
MCHC: 30.8 g/dL (ref 30.0–36.0)
MCV: 98.2 fL (ref 80.0–100.0)
Monocytes Absolute: 1 10*3/uL (ref 0.1–1.0)
Monocytes Relative: 15 %
Neutro Abs: 3.5 10*3/uL (ref 1.7–7.7)
Neutrophils Relative %: 52 %
Platelets: 148 10*3/uL — ABNORMAL LOW (ref 150–400)
RBC: 2.71 MIL/uL — ABNORMAL LOW (ref 3.87–5.11)
RDW: 16.9 % — ABNORMAL HIGH (ref 11.5–15.5)
WBC: 6.7 10*3/uL (ref 4.0–10.5)
nRBC: 0 % (ref 0.0–0.2)

## 2019-12-30 LAB — BASIC METABOLIC PANEL
Anion gap: 12 (ref 5–15)
BUN: 50 mg/dL — ABNORMAL HIGH (ref 8–23)
CO2: 24 mmol/L (ref 22–32)
Calcium: 9.1 mg/dL (ref 8.9–10.3)
Chloride: 100 mmol/L (ref 98–111)
Creatinine, Ser: 6.39 mg/dL — ABNORMAL HIGH (ref 0.44–1.00)
GFR calc Af Amer: 7 mL/min — ABNORMAL LOW (ref >60)
GFR calc non Af Amer: 6 mL/min — ABNORMAL LOW (ref >60)
Glucose, Bld: 97 mg/dL (ref 70–99)
Potassium: 4.1 mmol/L (ref 3.5–5.1)
Sodium: 136 mmol/L (ref 135–145)

## 2019-12-30 MED ORDER — CEFDINIR 300 MG PO CAPS: 300.0000 mg | ORAL_CAPSULE | ORAL | 0 refills | Status: AC

## 2019-12-30 MED ORDER — CEFDINIR 300 MG PO CAPS: 300.0000 mg | ORAL_CAPSULE | ORAL | Status: DC

## 2019-12-30 NOTE — Progress Notes (Signed)
[facility] Progress Note   Subjective:  Seen in room. Did ok overnight - breathing was stable, did not require O2. No CP. L arm edema looks slightly improved, pain is less as well. She is hoping for discharge today which is likely - today is her 79th birthday!  Objective Vitals:   12/29/19 1007 12/29/19 1418 12/29/19 2057 12/30/19 0547  BP: (!) 146/67 (!) 175/91 (!) 156/72 (!) 152/78  Pulse: 88  96 88  Resp: 17 18 18 17   Temp: 98.7 F (37.1 C) 98.5 F (36.9 C) 98.4 F (36.9 C) 98.1 F (36.7 C)  TempSrc: Oral Oral Oral Oral  SpO2: 99% 96% 96% 96%  Weight:       Physical Exam General: Elderly woman, NAD. Room air. Heart: RRR; 2/6 murmur Lungs: CTAB Extremities: No LE edema Dialysis Access: L forearm with patent AVG, edema/warmth present but much less than prior  Additional Objective Labs: Basic Metabolic Panel: Recent Labs  Lab 12/28/19 1659 12/29/19 0145 12/30/19 0151  NA 141 137 136  K 5.5* 3.6 4.1  CL 102 97* 100  CO2 21* 24 24  GLUCOSE 124* 103* 97  BUN 83* 24* 50*  CREATININE 9.84* 4.34* 6.39*  CALCIUM 8.9 8.4* 9.1  PHOS 5.8* 3.1  --    Liver Function Tests: Recent Labs  Lab 12/28/19 1659 12/29/19 0145  ALBUMIN 3.1* 2.8*   CBC: Recent Labs  Lab 12/28/19 1659 12/30/19 0151  WBC 6.8 6.7  NEUTROABS  --  3.5  HGB 8.5* 8.2*  HCT 28.0* 26.6*  MCV 98.9 98.2  PLT 141* 148*   Blood Culture    Component Value Date/Time   SDES BLOOD RIGHT HAND 12/28/2019 1835   SPECREQUEST  12/28/2019 1835    BOTTLES DRAWN AEROBIC AND ANAEROBIC Blood Culture adequate volume   CULT  12/28/2019 1835    NO GROWTH 2 DAYS Performed at [facility], [place] [address], [place], [place] [address]    REPTSTATUS PENDING 12/28/2019 1835   Medications: . sodium chloride    . sodium chloride    . [START ON 12/31/2019] ceFEPime (MAXIPIME) IV    . [START ON 12/31/2019] vancomycin     . amLODipine  10 mg Oral QHS  . atorvastatin  80 mg Oral Q breakfast  .  calcium acetate  2,001 mg Oral BID WC  . carvedilol  12.5 mg Oral Once per day on Tue Thu Sat   And  . carvedilol  12.5 mg Oral 2 times per day on Sun Mon Wed Fri  . Chlorhexidine Gluconate Cloth  6 each Topical Q0600  . heparin  5,000 Units Subcutaneous Q8H  . levothyroxine  75 mcg Oral QAC breakfast  . lidocaine-prilocaine  1 application Topical [id number]  . pantoprazole  40 mg Oral BID  . raloxifene  60 mg Oral Q breakfast  . sodium chloride flush  3 mL Intravenous Q12H    Dialysis Orders: TTS at [facility] 3:30hr, EDW 69kg, 2K/2.25Ca, UFP #2, TDC, heparin 5000 - Mircera 199mcg IV q 2 weeks (last 12/15/19) - Venofer 100 x 5 ordered, not given yet.  Assessment/Plan: 1. Fever/swollen L arm: New L AVG placed 2/10 -> VVS consulted, feels more likely mild Gore-tex reaction, doubts true infection. BCx 2/15 negative. On Vanc/Cefepime. 2. Acute hypoxic resp failure: Dyspneic on admit - dialyzed 2/15 with 2.3L UF. Breathing improved/stable. 3. ESRD: TTS sched. Dialyzed "early" on 2/16. No dyspnea - next HD planned for 2/18 at her output unit. 4.  HTN/volume: BP slightly high - will try to get down to EDW with next dialysis.  5. Anemia: Hgb 8.2. Aranesp 122mcg given 2/16. 6. Secondary hyperparathyroidism: Ca/Phos ok - continue binders (Phoslo). 7. Dispo: [place] for discharge today from renal standpoint.  Veneta Penton, PA-C 12/30/2019, 9:08 AM  [facility] Pager: [phone]

## 2019-12-30 NOTE — Progress Notes (Signed)
AVS given and reviewed with pt. Medications discussed and printed prescription provided to pt. All questions answered to satisfaction. Pt verbalized understanding of information given. Pt to be escorted off the unit with all belongings via wheelchair by staff member. 

## 2019-12-30 NOTE — Discharge Summary (Signed)
Physician Discharge Summary  Latoya Lopez STM:[id number] DOB: 1941-01-19 DOA: 12/28/2019  PCP: [place] date: 12/28/2019 Discharge date: 12/30/2019  Time spent: 55 minutes  Recommendations for Outpatient Follow-up:  1. Follow-up with Dr. Trula Slade, vascular surgery in 2 weeks. 2. Follow-up with [facility] in 1 to 2 weeks. 3. Follow-up at hemodialysis center on 12/31/2019/nephrology.   Discharge Diagnoses:  Principal Problem:   Fever Active Problems:   ESRD (end stage renal disease) ([place])   Hypothyroidism   Anemia of chronic disease   Hyperlipidemia   Acute respiratory failure with hypoxia ([place])   Sepsis without acute organ dysfunction [facility])   Discharge Condition: Stable and improved  Diet recommendation: Heart healthy  Filed Weights   12/28/19 2001 12/29/19 0000 12/29/19 0027  Weight: 73.3 kg 71.1 kg 71.9 kg    History of present illness:  HPI per Dr. Albertine Patricia is a 79 y.o. 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  She presented to [place] with complaints of shortness of breath with nausea and vomiting.  She just recently had an AV fistula graft placed of the left arm by Dr. Trula Slade on 2/10.  Patient previously had a fistula in the right arm, but stated that it no longer works.  Since the procedure she reported having soreness in the arm with increasing swelling.  Patient had been taking hydrocodone for pain with some improvement.  She received her first COVID-19 vaccine 4 days ago.  Subsequently, she has had a little bit of diarrhea and has had subjective fevers at home.  Denied having any significant abdominal pain, chest pain, headache, cough, recent sick contacts.  Normally she is not on oxygen at baseline.  Last hemodialysis session on 2/11 because she missed her hemodialysis session on 2/13 due to the diarrhea.    At [facility]  patient was reported to be febrile up to 102 degrees, tachycardic, and and tachypneic with O2 saturations as low as 80% on room air.  O2 saturations improved 4 L of nasal cannula oxygen.  Chest x-ray showed increased vascular congestion and development of diffuse interstitial and hazy pulmonary opacity concerning for possible edema.  There also was a more confluent airspace opacity in the right lung base concerning for atelectasis versus pneumonia.  Labs significant for MVC 9.4, hemoglobin 9.3, potassium 4.8, BUN 69, creatinine 8.8, glucose 144, lactic acid 0.9, D-dimer 853. Blood cultures were obtain  Patient had been given 6 mg of Decadron prior to Covid and influenza screening coming back as negative.  Ultrasound of the left forearm noted a patent AV graft with monophasic waves in the left subclavian, axillary, and brachial artery. Patient had been given empiric antibiotics of cefepime and azithromycin.  Hospital Course:  #1 fever/sepsis, POA Patient had presented with a febrile illness noted to be tachycardic, tachypneic however no leukocytosis or lactic acidosis.  COVID-19 PCR's/influenza PCR which was done was negative.  Blood cultures ordered with no growth to date.  Patient was placed empirically on broad-spectrum antibiotics of IV vancomycin IV cefepime and pancultured.  Vascular surgery was also consulted and ruled out graft infection.  Patient noted on presentation to have a fever of 102.  Patient maintained on IV vancomycin and IV cefepime.  Patient remained afebrile during the hospitalization and improved clinically.  Patient will be discharged home on 5 more days of oral cefdinir to complete a course of antibiotic treatment.  Outpatient follow-up with PCP.  2.  Acute respiratory failure with hypoxia Felt secondary to volume overload due to end-stage renal disease.  Patient underwent hemodialysis.  Patient improved clinically.  Patient's O2 was weaned down and patient was satting 97% on room air  by day of discharge.  Patient was also placed empirically on antibiotics due to concern for possible pneumonia.  Patient remained afebrile.  Patient be discharged home in stable and improved condition to complete a course of antibiotic treatment.  Outpatient follow-up.  3.  End-stage renal disease on hemodialysis Tuesday Thursday schedule Patient was seen by nephrology during the hospitalization.  Patient noted to have missed her hemodialysis on Saturday prior to admission.  Patient underwent hemodialysis during the hospitalization.  Patient improved clinically.  Patient be discharged home in stable and improved condition to follow-up in the hemodialysis center tomorrow 12/31/2019.  Outpatient follow-up.  4.  AV fistula/graft soreness/swelling AV fistula recently placed by vascular surgery, Dr. Trula Slade on 12/23/2019.  Patient had presented with complaints of soreness and swelling at the graft site.  Patient was evaluated by vascular ultrasound at the outside facility and no clot noted.  Patient was seen in consultation by vascular surgery and it was noted that they felt there were no signs or symptoms of infection and patient will need close outpatient follow-up.  5.  Normocytic anemia of chronic disease Secondary to end-stage renal disease.  H&H remained stable throughout the hospitalization.  6.  Hypertension Remained stable.  Patient was maintained on home regimen of antihypertensive medications.  7.  Hypothyroidism Patient maintained on home regimen Synthroid.  8.  Hyperlipidemia Patient maintained on statin.  Procedures:  None.  Consultations:  Vascular surgery: Dr. Oneida Alar 12/28/2019  Nephrology: Dr. Hollie Salk 12/28/2019  Discharge Exam: Vitals:   12/30/19 1426 12/30/19 1434  BP: (!) 145/96 (!) 145/96  Pulse: 91 91  Resp:  17  Temp: 98.9 F (37.2 C) 98.9 F (37.2 C)  SpO2: 97% 97%    General: NAD Cardiovascular: RRR Respiratory: CTAB  Discharge Instructions   Discharge  Instructions    Diet - low sodium heart healthy   Complete by: As directed    Increase activity slowly   Complete by: As directed      Allergies as of 12/30/2019   No Known Allergies     Medication List    TAKE these medications   acetaminophen 500 MG tablet Commonly known as: TYLENOL Take 500 mg by mouth every 6 (six) hours as needed for mild pain or headache.   amLODipine 10 MG tablet Commonly known as: NORVASC Take 10 mg by mouth at bedtime.   atorvastatin 80 MG tablet Commonly known as: LIPITOR Take 80 mg by mouth daily with breakfast.   calcium acetate 667 MG capsule Commonly known as: PHOSLO Take 2 capsules (1,334 mg total) by mouth 3 (three) times daily with meals. What changed:   how much to take  when to take this   carvedilol 12.5 MG tablet Commonly known as: COREG Take 12.5 mg by mouth See admin instructions. Take 1 tablet (12.5mg ) by mouth in the evening ONLY on Tuesdays, Thursdays, & Saturdays. Take 1 tablet (12.5 mg) by mouth twice daily on Sundays, Mondays, Wednesdays, & Fridays.   cefdinir 300 MG capsule Commonly known as: OMNICEF Take 1 capsule (300 mg total) by mouth every other day for 6 days. Start taking on: December 31, 2019   Claritin 10 MG tablet Generic drug: loratadine Take 10 mg by mouth at bedtime.  HYDROcodone-acetaminophen 5-325 MG tablet Commonly known as: NORCO/VICODIN Take 1 tablet by mouth every 4 (four) hours as needed for moderate pain.   levothyroxine 75 MCG tablet Commonly known as: SYNTHROID Take 75 mcg by mouth daily before breakfast.   lidocaine-prilocaine cream Commonly known as: EMLA Apply 1 application topically See admin instructions. Apply small amount to access site 1 to 2 hours prior to dialysis   multivitamin with minerals Tabs tablet Take 1 tablet by mouth at bedtime.   omeprazole 20 MG capsule Commonly known as: PRILOSEC Take 20 mg by mouth 2 (two) times daily before a meal.   raloxifene 60 MG  tablet Commonly known as: EVISTA Take 60 mg by mouth daily with breakfast.      No Known Allergies Follow-up Information    Serafina Mitchell, MD Follow up in 2 week(s).   Specialties: Vascular Surgery, Cardiology Why: offic will call you to schedule follow-up appointment (sent) Contact information: [address] [place] [address] [place]. Schedule an appointment as soon as possible for a visit in 2 week(s).   Why: f/u in 1-2 weeks. Contact information: [place] [address] [phone]        HD Follow up on 12/31/2019.   Why: f/u as scheduled on HD day           The results of significant diagnostics from this hospitalization (including imaging, microbiology, ancillary and laboratory) are listed below for reference.    Significant Diagnostic Studies: VAS [place] UPPER EXTREMITY ARTERIAL DUPLEX  Result Date: 12/14/2019 UPPER EXTREMITY DUPLEX STUDY Indications: New access.              Failed right AVG.              Failed left BVT.  Performing Technologist: Ronal Fear RVS, RCS  Examination Guidelines: A complete evaluation includes B-mode imaging, spectral Doppler, color Doppler, and power Doppler as needed of all accessible portions of each vessel. Bilateral testing is considered an integral part of a complete examination. Limited examinations for reoccurring indications may be performed as noted.  Left Pre-Dialysis Findings: +-----------------------+----------+--------------------+---------+--------+ Location               PSV (cm/s)Intralum. Diam. (cm)Waveform Comments +-----------------------+----------+--------------------+---------+--------+ Brachial Antecub. fossa105       0.53                triphasic         +-----------------------+----------+--------------------+---------+--------+ Radial Art at Wrist    86        0.20                triphasic          +-----------------------+----------+--------------------+---------+--------+ Ulnar Art at Wrist     54        0.22                triphasic         +-----------------------+----------+--------------------+---------+--------+  Summary:  Left: No obstruction visualized in the left upper extremity. *See table(s) above for measurements and observations. Electronically signed by Harold Barban MD on 12/14/2019 at 1:39:32 PM.    Final    VAS [place] UPPER EXTREMITY VEIN MAPPING  Result Date: 12/14/2019 UPPER EXTREMITY VEIN MAPPING  Indications: Pre-access. History: Failed right AVG          Left occluded BVT.  Performing Technologist: Ronal Fear RVS, RCS  Examination Guidelines: A complete evaluation  includes B-mode imaging, spectral Doppler, color Doppler, and power Doppler as needed of all accessible portions of each vessel. Bilateral testing is considered an integral part of a complete examination. Limited examinations for reoccurring indications may be performed as noted. +-----------------+-------------+----------+--------+ Left Cephalic    Diameter (cm)Depth (cm)Findings +-----------------+-------------+----------+--------+ Shoulder             0.21                        +-----------------+-------------+----------+--------+ Prox upper arm       0.16                        +-----------------+-------------+----------+--------+ Mid upper arm        0.18                        +-----------------+-------------+----------+--------+ Dist upper arm       0.13                        +-----------------+-------------+----------+--------+ Antecubital fossa    0.24                        +-----------------+-------------+----------+--------+ Prox forearm         0.12                        +-----------------+-------------+----------+--------+ Mid forearm          0.07                        +-----------------+-------------+----------+--------+ Dist forearm         0.06                         +-----------------+-------------+----------+--------+ +-----------------+-------------+----------+--------+ Left Basilic     Diameter (cm)Depth (cm)Findings +-----------------+-------------+----------+--------+ Antecubital fossa                       occluded +-----------------+-------------+----------+--------+ Left Brachial vein: proximal upper arm 0.45 cm mid upper arm 0.43 cm distal upper arm 0.43 cm antecubital fossa 0.32 cm Summary:  Left: Patent and compressible cephalic and brachial veins.       The basilic vein is occluded in the distal upper       arm/antecubital fossa.  Diagnosing physician: Harold Barban MD Electronically signed by Harold Barban MD on 12/14/2019 at 1:39:24 PM.    Final     Microbiology: Recent Results (from the past 240 hour(s))  SARS CORONAVIRUS 2 (TAT 6-24 HRS) Nasopharyngeal Nasopharyngeal Swab     Status: None   Collection Time: 12/21/19 11:54 AM   Specimen: Nasopharyngeal Swab  Result Value Ref Range Status   SARS Coronavirus 2 NEGATIVE NEGATIVE Final    Comment: (NOTE) SARS-CoV-2 target nucleic acids are NOT DETECTED. The SARS-CoV-2 RNA is generally detectable in upper and lower respiratory specimens during the acute phase of infection. Negative results do not preclude SARS-CoV-2 infection, do not rule out co-infections with other pathogens, and should not be used as the sole basis for treatment or other patient management decisions. Negative results must be combined with clinical observations, patient history, and epidemiological information. The expected result is Negative. Fact Sheet for Patients: SugarRoll.be Fact Sheet for Healthcare Providers: https://www.woods-mathews.com/ This test is not yet approved or cleared by the [place] FDA and  has been authorized for detection and/or diagnosis of SARS-CoV-2 by FDA under an Emergency Use Authorization (EUA). This EUA will remain  in effect  (meaning this test can be used) for the duration of the COVID-19 declaration under Section 56 4(b)(1) of the Act, 21 U.S.C. section 360bbb-3(b)(1), unless the authorization is terminated or revoked sooner. Performed at [facility], [place] [address], [place], [place] [address]   SARS CORONAVIRUS 2 (TAT 6-24 HRS) Nasopharyngeal Nasopharyngeal Swab     Status: None   Collection Time: 12/28/19  4:57 PM   Specimen: Nasopharyngeal Swab  Result Value Ref Range Status   SARS Coronavirus 2 NEGATIVE NEGATIVE Final    Comment: (NOTE) SARS-CoV-2 target nucleic acids are NOT DETECTED. The SARS-CoV-2 RNA is generally detectable in upper and lower respiratory specimens during the acute phase of infection. Negative results do not preclude SARS-CoV-2 infection, do not rule out co-infections with other pathogens, and should not be used as the sole basis for treatment or other patient management decisions. Negative results must be combined with clinical observations, patient history, and epidemiological information. The expected result is Negative. Fact Sheet for Patients: SugarRoll.be Fact Sheet for Healthcare Providers: https://www.woods-mathews.com/ This test is not yet approved or cleared by the [place] FDA and  has been authorized for detection and/or diagnosis of SARS-CoV-2 by FDA under an Emergency Use Authorization (EUA). This EUA will remain  in effect (meaning this test can be used) for the duration of the COVID-19 declaration under Section 56 4(b)(1) of the Act, 21 U.S.C. section 360bbb-3(b)(1), unless the authorization is terminated or revoked sooner. Performed at [facility], [place] [address] [place], [place] [address]   Culture, blood (single) w Reflex to ID Panel     Status: None (Preliminary result)   Collection Time: 12/28/19  5:20 PM   Specimen: BLOOD RIGHT WRIST  Result Value Ref Range Status   Specimen Description BLOOD  RIGHT WRIST  Final   Special Requests   Final    BOTTLES DRAWN AEROBIC ONLY Blood Culture adequate volume   Culture   Final    NO GROWTH 2 DAYS Performed at [facility], [place] [address] [place], [place] [address]    Report Status PENDING  Incomplete  Culture, blood (single) w Reflex to ID Panel     Status: None (Preliminary result)   Collection Time: 12/28/19  6:35 PM   Specimen: BLOOD RIGHT HAND  Result Value Ref Range Status   Specimen Description BLOOD RIGHT HAND  Final   Special Requests   Final    BOTTLES DRAWN AEROBIC AND ANAEROBIC Blood Culture adequate volume   Culture   Final    NO GROWTH 2 DAYS Performed at [facility], [place] [address], [place],  [address]    Report Status PENDING  Incomplete     Labs: Basic Metabolic Panel: Recent Labs  Lab 12/28/19 1659 12/29/19 0145 12/30/19 0151  NA 141 137 136  K 5.5* 3.6 4.1  CL 102 97* 100  CO2 21* 24 24  GLUCOSE 124* 103* 97  BUN 83* 24* 50*  CREATININE 9.84* 4.34* 6.39*  CALCIUM 8.9 8.4* 9.1  PHOS 5.8* 3.1  --    Liver Function Tests: Recent Labs  Lab 12/28/19 1659 12/29/19 0145  ALBUMIN 3.1* 2.8*   No results for input(s): LIPASE, AMYLASE in the last 168 hours. No results for input(s): AMMONIA in the last 168 hours. CBC: Recent Labs  Lab 12/28/19 1659 12/30/19  0151  WBC 6.8 6.7  NEUTROABS  --  3.5  HGB 8.5* 8.2*  HCT 28.0* 26.6*  MCV 98.9 98.2  PLT 141* 148*   Cardiac Enzymes: No results for input(s): CKTOTAL, CKMB, CKMBINDEX, TROPONINI in the last 168 hours. BNP: BNP (last 3 results) No results for input(s): BNP in the last 8760 hours.  ProBNP (last 3 results) No results for input(s): PROBNP in the last 8760 hours.  CBG: No results for input(s): GLUCAP in the last 168 hours.     Signed:  Irine Seal MD.  [facility] 12/30/2019, 3:03 PM

## 2019-12-30 NOTE — Plan of Care (Signed)

## 2019-12-31 ENCOUNTER — Telehealth: Payer: Self-pay | Admitting: Physician Assistant

## 2019-12-31 NOTE — Telephone Encounter (Signed)
Transition of Care Contact from inpatient facility  Date of discharge: 12/30/19 Date of contact: 12/31/19 Method of contact: phone  Patient contacted to discuss transition of care from inpatient facility. Patient was hospitalized from 12/28/15 to 12/30/15 with a diagnosis of fever. Discharge medications were reviewed. Patient has already picked up her antibiotics. She will follow up with Dr. Trula Slade in 2 weeks and reports she is going to call home health about obtaining a walker, but does have a cane and can complete her ADLs. Advised her to let us know if she needs any help obtaining the walker or other DME. She will continue to follow up at her kidney center for dialysis.  Anice Paganini, PA-C 12/31/2019, 1:23 PM  [facility] Pager: [phone]

## 2020-01-02 LAB — CULTURE, BLOOD (SINGLE)
Culture: NO GROWTH
Culture: NO GROWTH
Special Requests: ADEQUATE
Special Requests: ADEQUATE

## 2020-01-11 ENCOUNTER — Telehealth (HOSPITAL_COMMUNITY): Payer: Self-pay

## 2020-01-11 NOTE — Telephone Encounter (Signed)

## 2020-01-12 ENCOUNTER — Ambulatory Visit (HOSPITAL_COMMUNITY)
Admission: RE | Admit: 2020-01-12 | Discharge: 2020-01-12 | Disposition: A | Discharge status: Home / Self Care | Payer: Medicare Other | Source: Ambulatory Visit | Attending: Vascular Surgery | Admitting: Vascular Surgery

## 2020-01-12 ENCOUNTER — Other Ambulatory Visit: Payer: Self-pay

## 2020-01-12 ENCOUNTER — Ambulatory Visit (INDEPENDENT_AMBULATORY_CARE_PROVIDER_SITE_OTHER): Payer: Self-pay | Admitting: Physician Assistant

## 2020-01-12 VITALS — BP 141/69 | HR 90 | Temp 98.0°F | Resp 20 | Ht 69.0 in | Wt 154.0 lb

## 2020-01-12 DIAGNOSIS — N186 End stage renal disease: Secondary | ICD-10-CM | POA: Diagnosis not present

## 2020-01-12 DIAGNOSIS — Z992 Dependence on renal dialysis: Secondary | ICD-10-CM | POA: Diagnosis not present

## 2020-01-12 NOTE — Progress Notes (Addendum)
POST OPERATIVE OFFICE NOTE    CC:  F/u for surgery  HPI:  This is a 79 y.o. female who is s/p Left forearm arteriovenous graft by Dr. Trula Slade on 01/02/20. She has history of multiple dialysis accesses- listed below. She has been dialyzing via a TDC TTS. She presents today for evaluation of dialysis steal symptoms. She has been having some mild swelling in left forearm in are of graft and pain in her left forearm hat is intermittent since her surgery. She describes the pain as someone " sticking and twisting a knife in my arm". She says it is not constant. Occurs more often when her arm is bent. She denies any pain to compression of arm, no hand pain or pain in her fingers. She has been elevating her arm with improvement of the swelling. She says her hand is always cold but has been like that since her initial AV fistula surgery in 2020. She has normal motor and sensation of her left upper extremity.  She was recently at admitted at [facility] 2/15-2/17/21 for shortness of breath, nausea and vomiting. She was found to be febrile, tachycardic, and tachypneic. She was empirically treated with IV Vancomycin and Cefepime. She was otherwise symptomatically managed, she improved overall and was discharged home. She has been feeling well since. She denies any chest pain, shortness of breath, abdominal pain, nausea, vomiting, fever or chills.   08-06-2017:  R BCF 08-17-2017:  R IJ TDC 08-21-2107:  R UE AVGG 07-07-2018:  Revision  RUE AVGG for ulcer 09-17-2018:  T&R R UE AVGG 09-21-2018:  R IJ TDC 05-29-2019: 1st Left BVT 07-22-2019:  2nd left BVT   No Known Allergies  Current Outpatient Medications  Medication Sig Dispense Refill  . acetaminophen (TYLENOL) 500 MG tablet Take 500 mg by mouth every 6 (six) hours as needed for mild pain or headache.    Marland Kitchen amLODipine (NORVASC) 10 MG tablet Take 10 mg by mouth at bedtime.     Marland Kitchen atorvastatin (LIPITOR) 80 MG tablet Take 80 mg by mouth daily with breakfast.      . calcium acetate (PHOSLO) 667 MG capsule Take 2 capsules (1,334 mg total) by mouth 3 (three) times daily with meals. (Patient taking differently: Take 2,001 mg by mouth 2 (two) times a day. ) 90 capsule 0  . carvedilol (COREG) 12.5 MG tablet Take 12.5 mg by mouth See admin instructions. Take 1 tablet (12.5mg ) by mouth in the evening ONLY on Tuesdays, Thursdays, & Saturdays. Take 1 tablet (12.5 mg) by mouth twice daily on Sundays, Mondays, Wednesdays, & Fridays.    Marland Kitchen HYDROcodone-acetaminophen (NORCO/VICODIN) 5-325 MG tablet Take 1 tablet by mouth every 4 (four) hours as needed for moderate pain. 15 tablet 0  . levothyroxine (SYNTHROID, LEVOTHROID) 75 MCG tablet Take 75 mcg by mouth daily before breakfast.    . lidocaine-prilocaine (EMLA) cream Apply 1 application topically See admin instructions. Apply small amount to access site 1 to 2 hours prior to dialysis  12  . loratadine (CLARITIN) 10 MG tablet Take 10 mg by mouth at bedtime.     . Methoxy PEG-Epoetin Beta (MIRCERA IJ) Mircera    . Multiple Vitamin (MULTIVITAMIN WITH MINERALS) TABS tablet Take 1 tablet by mouth at bedtime.     Marland Kitchen omeprazole (PRILOSEC) 20 MG capsule Take 20 mg by mouth 2 (two) times daily before a meal.     . raloxifene (EVISTA) 60 MG tablet Take 60 mg by mouth daily with breakfast.  No current facility-administered medications for this visit.     ROS:  Review of Systems  Constitutional: Negative for chills, fever and malaise/fatigue.  HENT: Negative for congestion and sore throat.   Eyes: Negative for blurred vision.  Respiratory: Negative for cough and shortness of breath.   Cardiovascular: Negative for chest pain, palpitations, claudication and leg swelling.  Gastrointestinal: Negative for abdominal pain, constipation, nausea and vomiting.  Genitourinary: Negative for dysuria.  Musculoskeletal: Negative for joint pain.  Neurological: Negative for dizziness, weakness and headaches.   Physical  Exam:  Vitals:   01/12/20 0828  BP: (!) 141/69  Pulse: 90  Resp: 20  Temp: 98 F (36.7 C)  SpO2: 96%  Weight: 154 lb (69.9 kg)  Height: 5\' 9"  (1.753 m)   General: Well appearing, well nourished, no distress Cardiac: regular rate and rhythm, murmur Lungs: clear to auscultation bilaterally, non labored Incision:  Left forearm, intact, clean and dry. Staples removed as well as 2 sutures. No erythema or drainage Extremities:  2+ left brachial pulse, 1+ left radial pulse. Normal motor and sensation. Left hand warm. Normal grip strength. No ischemic changes. No erythema. Mild edema of left forearm in are of graft. No tenderness. Palpable thrill in graft. 2+ right brachial and radial pulses. Normal motor and sensation. Right hand warm. Normal grip strength Neuro: Alert and oriented  Non- Invasive: 01/12/20 Dialysis Steal duplex: Right second digital pressure is 181 under ambient conditions. Left second digital pressure increases from 91 mm HG under ambient conditions to 183 mm Hg. Right radial pressure was 186 mmHg. Patient refused a right brachial pressure secondary to prior dialysis access. Left digital pressures increased to contralateral baseline with AVG compression suggestive of some AVG steal.   Assessment/Plan:  This is a 79 y.o. female who is s/p left forearm arteriovenous graft 12/23/19 by Dr. Trula Slade. She has some steal clinically and based on non -invasive study today. She is having pain in left forearm that is intermittent presently. No pain in left hand or fingers. She has no signs of infection in left upper extremity. Her staples and sutures were removed today and her arm is healing well. She has not used the graft yet for dialysis but they can start using it next week. I will let her dialysis center [facility] know that they can start to use her graft. She presently is using a [facility] which can be removed once AVG is working well.   - I had extensive discussion with patient that  she has likely no other options for upper extremity dialysis access if she were to have this graft ligated. Her next option would likely be in her lower extremities. She was scheduled by Dr. Trula Slade to have a saphenous vein mapping on 01/18/20 to look at potential options for low extremity access, but the patient is very addiment that she will not have an access put in her legs. I discussed with her that she cannot keep her [facility] indefinitely if she were to decide that she cannot tolerate her left upper extremity pain. She understands that her options are limited. At this time she is willing to deal with the left upper extremity pain  -she will be able to have left AV graft accessed next week after 01/20/20. She will notify us and follow up as needed if her arm pain worsens or if there are issues with the AV graft   Karoline Caldwell, PA-C [facility] [phone]  Clinic MD:  Dr. Carlis Abbott

## 2020-01-18 ENCOUNTER — Encounter (HOSPITAL_COMMUNITY): Payer: Medicare Other

## 2020-01-29 ENCOUNTER — Telehealth: Payer: Self-pay

## 2020-01-29 NOTE — Telephone Encounter (Signed)
rec'vd urgent referral from Juanell Fairly, NP  ([place]) 01/29/20 for AVG swollen/painful - s/w Dr. Llana Aliment to schedule fistulagram - To SP to schedule

## 2020-02-02 ENCOUNTER — Other Ambulatory Visit: Payer: Self-pay

## 2020-02-08 ENCOUNTER — Other Ambulatory Visit (HOSPITAL_COMMUNITY)
Admission: RE | Admit: 2020-02-08 | Discharge: 2020-02-08 | Disposition: A | Discharge status: Home / Self Care | Payer: Medicare Other | Source: Ambulatory Visit | Attending: Vascular Surgery | Admitting: Vascular Surgery

## 2020-02-08 DIAGNOSIS — Z20822 Contact with and (suspected) exposure to covid-19: Secondary | ICD-10-CM | POA: Insufficient documentation

## 2020-02-08 DIAGNOSIS — Z01812 Encounter for preprocedural laboratory examination: Secondary | ICD-10-CM | POA: Diagnosis present

## 2020-02-08 LAB — SARS CORONAVIRUS 2 (TAT 6-24 HRS): SARS Coronavirus 2: NEGATIVE

## 2020-02-10 ENCOUNTER — Ambulatory Visit (HOSPITAL_COMMUNITY)
Admission: RE | Admit: 2020-02-10 | Discharge: 2020-02-10 | Disposition: A | Discharge status: Home / Self Care | Payer: Medicare Other | Attending: Vascular Surgery | Admitting: Vascular Surgery

## 2020-02-10 ENCOUNTER — Other Ambulatory Visit: Payer: Self-pay

## 2020-02-10 ENCOUNTER — Encounter (HOSPITAL_COMMUNITY): Payer: Self-pay | Admitting: Vascular Surgery

## 2020-02-10 ENCOUNTER — Encounter (HOSPITAL_COMMUNITY)
Admission: RE | Disposition: A | Discharge status: Home / Self Care | Payer: Self-pay | Source: Home / Self Care | Attending: Vascular Surgery

## 2020-02-10 DIAGNOSIS — Z79899 Other long term (current) drug therapy: Secondary | ICD-10-CM | POA: Diagnosis not present

## 2020-02-10 DIAGNOSIS — N186 End stage renal disease: Secondary | ICD-10-CM

## 2020-02-10 DIAGNOSIS — Y832 Surgical operation with anastomosis, bypass or graft as the cause of abnormal reaction of the patient, or of later complication, without mention of misadventure at the time of the procedure: Secondary | ICD-10-CM | POA: Diagnosis not present

## 2020-02-10 DIAGNOSIS — T82858A Stenosis of vascular prosthetic devices, implants and grafts, initial encounter: Secondary | ICD-10-CM | POA: Diagnosis present

## 2020-02-10 DIAGNOSIS — Z79890 Hormone replacement therapy (postmenopausal): Secondary | ICD-10-CM | POA: Diagnosis not present

## 2020-02-10 DIAGNOSIS — T82898A Other specified complication of vascular prosthetic devices, implants and grafts, initial encounter: Secondary | ICD-10-CM | POA: Diagnosis not present

## 2020-02-10 DIAGNOSIS — Z992 Dependence on renal dialysis: Secondary | ICD-10-CM

## 2020-02-10 HISTORY — PX: A/V FISTULAGRAM: CATH118298

## 2020-02-10 LAB — POCT I-STAT, CHEM 8
BUN: 23 mg/dL (ref 8–23)
Calcium, Ion: 1.11 mmol/L — ABNORMAL LOW (ref 1.15–1.40)
Chloride: 96 mmol/L — ABNORMAL LOW (ref 98–111)
Creatinine, Ser: 4.8 mg/dL — ABNORMAL HIGH (ref 0.44–1.00)
Glucose, Bld: 118 mg/dL — ABNORMAL HIGH (ref 70–99)
HCT: 28 % — ABNORMAL LOW (ref 36.0–46.0)
Hemoglobin: 9.5 g/dL — ABNORMAL LOW (ref 12.0–15.0)
Potassium: 3.9 mmol/L (ref 3.5–5.1)
Sodium: 136 mmol/L (ref 135–145)
TCO2: 33 mmol/L — ABNORMAL HIGH (ref 22–32)

## 2020-02-10 SURGERY — A/V FISTULAGRAM
Anesthesia: LOCAL

## 2020-02-10 MED ORDER — SODIUM CHLORIDE 0.9 % IV SOLN
250.0000 mL | INTRAVENOUS | Status: DC | PRN
  Administered 2020-02-10: 250 mL via INTRAVENOUS

## 2020-02-10 MED ORDER — HEPARIN SODIUM (PORCINE) 1000 UNIT/ML IJ SOLN
INTRAMUSCULAR | Status: DC | PRN
  Administered 2020-02-10: 3000 [IU] via INTRAVENOUS

## 2020-02-10 MED ORDER — LIDOCAINE HCL (PF) 1 % IJ SOLN
INTRAMUSCULAR | Status: DC | PRN
  Administered 2020-02-10: 5 mL

## 2020-02-10 MED ORDER — IODIXANOL 320 MG/ML IV SOLN
INTRAVENOUS | Status: DC | PRN
  Administered 2020-02-10: 75 mL via INTRAVENOUS

## 2020-02-10 MED ORDER — SODIUM CHLORIDE 0.9% FLUSH: 3.0000 mL | Freq: Two times a day (BID) | INTRAVENOUS | Status: DC

## 2020-02-10 MED ORDER — SODIUM CHLORIDE 0.9% FLUSH: 3.0000 mL | INTRAVENOUS | Status: DC | PRN

## 2020-02-10 MED ORDER — HEPARIN (PORCINE) IN NACL 1000-0.9 UT/500ML-% IV SOLN
INTRAVENOUS | Status: DC | PRN
  Administered 2020-02-10: 500 mL

## 2020-02-10 SURGICAL SUPPLY — 19 items
BAG SNAP BAND KOVER 36X36 (MISCELLANEOUS) ×2 IMPLANT
BALLN MUSTANG 6.0X40 75 (BALLOONS) ×2
BALLN MUSTANG 7.0X20 75 (BALLOONS) ×2
BALLOON MUSTANG 6.0X40 75 (BALLOONS) IMPLANT
BALLOON MUSTANG 7.0X20 75 (BALLOONS) IMPLANT
COVER DOME SNAP 22 D (MISCELLANEOUS) ×2 IMPLANT
DCB RANGER 7.0X40 135 (BALLOONS) IMPLANT
KIT ENCORE 26 ADVANTAGE (KITS) ×1 IMPLANT
KIT MICROPUNCTURE NIT STIFF (SHEATH) ×1 IMPLANT
PROTECTION STATION PRESSURIZED (MISCELLANEOUS) ×2
RANGER DCB 7.0X40 135 (BALLOONS) ×2
SHEATH PINNACLE R/O II 7F 4CM (SHEATH) ×1 IMPLANT
SHEATH PROBE COVER 6X72 (BAG) ×3 IMPLANT
STATION PROTECTION PRESSURIZED (MISCELLANEOUS) ×1 IMPLANT
STOPCOCK MORSE 400PSI 3WAY (MISCELLANEOUS) ×2 IMPLANT
TRAY PV CATH (CUSTOM PROCEDURE TRAY) ×2 IMPLANT
TUBING CIL FLEX 10 FLL-RA (TUBING) ×2 IMPLANT
WIRE BENTSON .035X145CM (WIRE) ×1 IMPLANT
WIRE G V18X300CM (WIRE) ×1 IMPLANT

## 2020-02-10 NOTE — H&P (Signed)
HPI:  This is a 79 y.o. female who is s/p Left forearm arteriovenous graft by Dr. Trula Slade on 01/02/20. She has history of multiple dialysis accesses- listed below. She has been dialyzing via a TDC TTS.  Reports a lot of pain with use of left forearm AVG. Using [facility] instead.  08-06-2017: R BCF 08-17-2017: R IJ TDC 08-21-2107: R UE AVGG 07-07-2018: Revision RUE AVGG for ulcer 09-17-2018: T&R R UE AVGG 09-21-2018: R IJ TDC 05-29-2019: 1st Left BVT 07-22-2019: 2nd left BVT   No Known Allergies        Current Outpatient Medications  Medication Sig Dispense Refill  . acetaminophen (TYLENOL) 500 MG tablet Take 500 mg by mouth every 6 (six) hours as needed for mild pain or headache.    Marland Kitchen amLODipine (NORVASC) 10 MG tablet Take 10 mg by mouth at bedtime.     Marland Kitchen atorvastatin (LIPITOR) 80 MG tablet Take 80 mg by mouth daily with breakfast.     . calcium acetate (PHOSLO) 667 MG capsule Take 2 capsules (1,334 mg total) by mouth 3 (three) times daily with meals. (Patient taking differently: Take 2,001 mg by mouth 2 (two) times a day. ) 90 capsule 0  . carvedilol (COREG) 12.5 MG tablet Take 12.5 mg by mouth See admin instructions. Take 1 tablet (12.5mg ) by mouth in the evening ONLY on Tuesdays, Thursdays, & Saturdays. Take 1 tablet (12.5 mg) by mouth twice daily on Sundays, Mondays, Wednesdays, & Fridays.    Marland Kitchen HYDROcodone-acetaminophen (NORCO/VICODIN) 5-325 MG tablet Take 1 tablet by mouth every 4 (four) hours as needed for moderate pain. 15 tablet 0  . levothyroxine (SYNTHROID, LEVOTHROID) 75 MCG tablet Take 75 mcg by mouth daily before breakfast.    . lidocaine-prilocaine (EMLA) cream Apply 1 application topically See admin instructions. Apply small amount to access site 1 to 2 hours prior to dialysis  12  . loratadine (CLARITIN) 10 MG tablet Take 10 mg by mouth at bedtime.     . Methoxy PEG-Epoetin Beta (MIRCERA IJ) Mircera    . Multiple Vitamin (MULTIVITAMIN WITH MINERALS) TABS tablet  Take 1 tablet by mouth at bedtime.     Marland Kitchen omeprazole (PRILOSEC) 20 MG capsule Take 20 mg by mouth 2 (two) times daily before a meal.     . raloxifene (EVISTA) 60 MG tablet Take 60 mg by mouth daily with breakfast.      No current facility-administered medications for this visit.     ROS:  Review of Systems  Constitutional: Negative for chills, fever and malaise/fatigue.  HENT: Negative for congestion and sore throat.   Eyes: Negative for blurred vision.  Respiratory: Negative for cough and shortness of breath.   Cardiovascular: Negative for chest pain, palpitations, claudication and leg swelling.  Gastrointestinal: Negative for abdominal pain, constipation, nausea and vomiting.  Genitourinary: Negative for dysuria.  Musculoskeletal: Negative for joint pain.  Neurological: Negative for dizziness, weakness and headaches.   Physical Exam:     Vitals:   01/12/20 0828  BP: (!) 141/69  Pulse: 90  Resp: 20  Temp: 98 F (36.7 C)  SpO2: 96%  Weight: 154 lb (69.9 kg)  Height: 5\' 9"  (1.753 m)   General: Well appearing, well nourished, no distress Cardiac: regular rate and rhythm, murmur Lungs: clear to auscultation bilaterally, non labored Incision:  Left forearm, intact, clean and dry. Staples removed as well as 2 sutures. No erythema or drainage Extremities:  2+ left brachial pulse, 1+ left radial pulse. Normal motor and sensation. Left  hand warm. Normal grip strength. No ischemic changes. No erythema. Mild edema of left forearm in area of graft. No tenderness. Palpable thrill in graft. 2+ right brachial and radial pulses. Normal motor and sensation. Right hand warm. Normal grip strength Neuro: Alert and oriented  Non- Invasive: 01/12/20 Dialysis Steal duplex: Right second digital pressure is 181 under ambient conditions. Left second digital pressure increases from 91 mm HG under ambient conditions to 183 mm Hg. Right radial pressure was 186 mmHg. Patient refused a right  brachial pressure secondary to prior dialysis access. Left digital pressures increased to contralateral baseline with AVG compression suggestive of some AVG steal.   Assessment/Plan:  This is a 79 y.o. female who is s/p left forearm arteriovenous graft 12/23/19 by Dr. Trula Slade. Plan left arm fistulogram today for ongoing pain with cannulation.  Good thrill in graft.  Forearm swelling.  No erythema.     Marty Heck, MD [facility] [place] Office: [place]

## 2020-02-10 NOTE — Op Note (Signed)
OPERATIVE NOTE   PROCEDURE: 1. left forearm arteriovenous graft cannulation under ultrasound guidance 2. left arm fistulogram including central venogram 3. left peripheral segment venoplasty (brachial vein at antecubital fossa) with 6 mm x 40 mm Mustang, 7 mm x 40 mm drug coated Lutonix, and 7 mm x 20 mm Mustang    PRE-OPERATIVE DIAGNOSIS: Malfunctioning left forearm loop graft with arm swelling and pain  POST-OPERATIVE DIAGNOSIS: same as above   SURGEON: Marty Heck, MD  ANESTHESIA: local  ESTIMATED BLOOD LOSS: 5 cc  FINDING(S): 1. The left forearm loop graft was widely patent in the forearm.  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  There was no other evidence of upper arm or central venous stenosis.  Ultimately this was crossed and angioplastied with a 6 mm x 40 mm Mustang and then 7 mm x 40 mm drug-coated Ranger.  Given more than 30% residual stenosis, I then used a short focal 7 mm x 20 mm Mustang that was inflated to burst in order to finally get a good result with no significant residual stenosis.  They can attempt to use the graft tomorrow in dialysis.  SPECIMEN(S):  None  CONTRAST: 75 cc  INDICATIONS: Latoya Lopez is a 79 y.o. female who  presents with malfunctioning left forearm arteriovenous loop graft.  The patient is scheduled for left arm fistulogram.  The patient is aware the risks include but are not limited to: bleeding, infection, thrombosis of the cannulated access, and possible anaphylactic reaction to the contrast.  The patient is aware of the risks of the procedure and elects to proceed forward.  DESCRIPTION: After full informed written consent was obtained, the patient was brought back to the angiography suite and placed supine upon the angiography table.  The patient was connected to monitoring equipment.  The left arm  was prepped and draped in the standard fashion for a left arm fistulogram.  Under  ultrasound guidance, the left arm arteriovenous graft was evaluated, it was patent, an image was saved.  It was cannulated with a micropuncture needle under ultrasound guidance on the venous side of the graft.  The microwire was advanced into the fistula and the needle was exchanged for the a microsheath, which was lodged 2 cm into the access.  The wire was removed and the sheath was connected to the IV extension tubing.  Hand injections were completed to image the access from the antecubitum up to the level of axilla.  The central venous structures were also imaged by hand injections.  After evaluating all the images it appeared she had a stenosis in the brachial outflow vein at the antecubitum just past the venous anastomosis.  We then placed a Bentson wire and exchanged for a short 7 French sheath in the graft.  Patient was given 3000 units IV heparin.  We did a hand-injection to confirm we crossed the lesion.  We initially treated this with a 6 mm x 40 mm Mustang.  We then exchanged for a V 18 wire and treated this with a 7 mm x 40 mm drug-coated Ranger for 3 minutes.  There was still more than 30% residual stenosis even after inflating the drug balloon to burst pressure.  I then decided to use a shorter balloon for more radial force.  We used a 7 mm x 20 mm Mustang to burst pressure for 2 minutes and finally got a very nice result with less than  30% residual stenosis.  I did not think I could use in a larger balloon here given the size of the vein.  She had excellent thrill in the graft.  A 4-0 Monocryl purse-string suture was sewn around the sheath.  The sheath was removed while tying down the suture.  A sterile bandage was applied to the puncture site.  COMPLICATIONS: None  CONDITION: Stable  Marty Heck, MD [facility]: [phone]  02/10/2020 10:26 AM

## 2020-02-10 NOTE — Discharge Instructions (Signed)

## 2020-05-12 ENCOUNTER — Other Ambulatory Visit: Payer: Self-pay | Admitting: *Deleted

## 2020-05-12 DIAGNOSIS — M79606 Pain in leg, unspecified: Secondary | ICD-10-CM

## 2020-05-25 ENCOUNTER — Ambulatory Visit (HOSPITAL_COMMUNITY)
Admission: RE | Admit: 2020-05-25 | Discharge: 2020-05-25 | Disposition: A | Discharge status: Home / Self Care | Payer: Medicare Other | Source: Ambulatory Visit | Attending: Surgery | Admitting: Surgery

## 2020-05-25 ENCOUNTER — Other Ambulatory Visit: Payer: Self-pay

## 2020-05-25 ENCOUNTER — Ambulatory Visit (INDEPENDENT_AMBULATORY_CARE_PROVIDER_SITE_OTHER): Payer: Medicare Other | Admitting: Surgery

## 2020-05-25 ENCOUNTER — Encounter: Payer: Self-pay | Admitting: Surgery

## 2020-05-25 VITALS — BP 167/72 | HR 90 | Temp 98.0°F | Resp 18 | Ht 69.0 in | Wt 151.0 lb

## 2020-05-25 DIAGNOSIS — N186 End stage renal disease: Secondary | ICD-10-CM

## 2020-05-25 DIAGNOSIS — Z992 Dependence on renal dialysis: Secondary | ICD-10-CM

## 2020-05-25 DIAGNOSIS — M79606 Pain in leg, unspecified: Secondary | ICD-10-CM | POA: Diagnosis not present

## 2020-05-25 NOTE — Progress Notes (Signed)
[facility] [place]  Patient name: Latoya Lopez MRN: [id number] DOB: [birth year] Sex: female   REASON FOR VISIT:    Leg pain  HISOTRY OF PRESENT ILLNESS:    Latoya Lopez is a 79 y.o. female who has had multiple prior access procedures.  She is currently using a left forearm graft.  She no longer has symptoms of steal in the left arm.  She states that she is having pain in both of her feet.  This is a sharp pain on the top of her feet.  She says she can walk around her house without difficulty but the pain gets worse when she tries to walk further distances.  She does not have any open wounds.  She does not have pain on the bottom of her foot or issues with calf claudication.   08-06-2017: R BCF 08-17-2017: R IJ TDC 08-21-2107: R UE AVGG 07-07-2018: Revision RUE [place] for ulcer 09-17-2018: T&R R UE AVGG 09-21-2018: R IJ TDC 05-29-2019: 1st Left BVT 07-22-2019: 2nd left BVT 01-02-2020:  Left FA AVGG  PAST MEDICAL HISTORY:   Past Medical History:  Diagnosis Date  . Anemia of renal disease   . Arthritis    osteoarthritis  . 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 Dyspnea    when climbing stairs  . ESRD (end stage renal disease) ([place])    hemodialysis MWF at [facility]  . GERD (gastroesophageal reflux disease)   . Heart murmur    "Nothing to be concerned about, the heart Dr said."  . Hyperkalemia   . Hyperlipidemia   . Hyperphosphatemia   . Hypertension    Essential with goal blood pressure less than 130/80, 12/22/2019-   . Hypokalemia   . Hypoparathyroidism ([place])   . Hypothyroidism   . Membranous glomerulonephritis   . Proteinuria   . S/P partial hysterectomy   . Seasonal allergies   . Secondary hyperparathyroidism, renal ([place])   . Wears glasses      FAMILY HISTORY:   Family History  Problem Relation Age of Onset  . Stroke Mother   . Heart attack Father   . Heart attack  Sister     SOCIAL HISTORY:   Social History   Tobacco Use  . Smoking status: Former Smoker    Types: Cigarettes    Quit date: 1997    Years since quitting: 24.5  . Smokeless tobacco: Never Used  . Tobacco comment: quit smoking cigarettes in 1997  Substance Use Topics  . Alcohol use: No     ALLERGIES:   No Known Allergies   CURRENT MEDICATIONS:   Current Outpatient Medications  Medication Sig Dispense Refill  . acetaminophen (TYLENOL) 500 MG tablet Take 500 mg by mouth every 6 (six) hours as needed for mild pain or headache.    Marland Kitchen amLODipine (NORVASC) 10 MG tablet Take 10 mg by mouth at bedtime.     Marland Kitchen atorvastatin (LIPITOR) 80 MG tablet Take 80 mg by mouth daily with breakfast.     . calcium acetate (PHOSLO) 667 MG capsule Take 2 capsules (1,334 mg total) by mouth 3 (three) times daily with meals. (Patient taking differently: Take 2,001 mg by mouth 2 (two) times daily with a meal. ) 90 capsule 0  . carvedilol (COREG) 12.5 MG tablet Take 12.5 mg by mouth See admin instructions. Take 1 tablet (12.5mg ) by mouth in the evening ONLY on Tuesdays, Thursdays, & Saturdays. Take 1  tablet (12.5 mg) by mouth twice daily on Sundays, Mondays, Wednesdays, & Fridays.    Marland Kitchen gabapentin (NEURONTIN) 100 MG capsule     . levothyroxine (SYNTHROID, LEVOTHROID) 75 MCG tablet Take 75 mcg by mouth daily before breakfast.    . lidocaine-prilocaine (EMLA) cream Apply 1 application topically See admin instructions. Apply small amount to access site 1 to 2 hours prior to dialysis  12  . LOPERAMIDE HCL PO Take by mouth.    . loratadine (CLARITIN) 10 MG tablet Take 10 mg by mouth daily.     . Methoxy PEG-Epoetin Beta (MIRCERA IJ) Mircera    . Multiple Vitamin (MULTIVITAMIN WITH MINERALS) TABS tablet Take 1 tablet by mouth daily.     Marland Kitchen omeprazole (PRILOSEC) 20 MG capsule Take 20 mg by mouth 2 (two) times daily before a meal.     . raloxifene (EVISTA) 60 MG tablet Take 60 mg by mouth daily with breakfast.       No current facility-administered medications for this visit.    REVIEW OF SYSTEMS:   [X]  denotes positive finding, [ ]  denotes negative finding Cardiac  Comments:  Chest pain or chest pressure:    Shortness of breath upon exertion:    Short of breath when lying flat:    Irregular heart rhythm:        Vascular    Pain in calf, thigh, or hip brought on by ambulation:    Pain in feet at night that wakes you up from your sleep:  x   Blood clot in your veins:    Leg swelling:         Pulmonary    Oxygen at home:    Productive cough:     Wheezing:         Neurologic    Sudden weakness in arms or legs:     Sudden numbness in arms or legs:     Sudden onset of difficulty speaking or slurred speech:    Temporary loss of vision in one eye:     Problems with dizziness:         Gastrointestinal    Blood in stool:     Vomited blood:         Genitourinary    Burning when urinating:     Blood in urine:        Psychiatric    Major depression:         Hematologic    Bleeding problems:    Problems with blood clotting too easily:        Skin    Rashes or ulcers:        Constitutional    Fever or chills:      PHYSICAL EXAM:   Vitals:   05/25/20 1025  BP: (!) 167/72  Pulse: 90  Resp: 18  Temp: 98 F (36.7 C)  SpO2: 93%  Weight: 151 lb (68.5 kg)  Height: 5\' 9"  (1.753 m)    GENERAL: The patient is a well-nourished female, in no acute distress. The vital signs are documented above. CARDIAC: There is a regular rate and rhythm.  VASCULAR: I cannot palpate pedal pulses.  She does have 1+ edema. PULMONARY: Non-labored respirations ABDOMEN: Soft and non-tender with normal pitched bowel sounds.  MUSCULOSKELETAL: There are no major deformities or cyanosis. NEUROLOGIC: No focal weakness or paresthesias are detected. SKIN: There are no ulcers or rashes noted. PSYCHIATRIC: The patient has a normal affect.  STUDIES:   I have reviewed the  following  studies:  +-------+-----------+-----------+------------+------------+  ABI/TBIToday's ABIToday's TBIPrevious ABIPrevious TBI  +-------+-----------+-----------+------------+------------+  Right 1.07    0.78                  +-------+-----------+-----------+------------+------------+  Left  1.11    0.87                  +-------+-----------+-----------+------------+------------+ RIGHT TOE:  135 LEFT TOE:  147 MEDICAL ISSUES:   I discussed with the patient that I do not feel her leg complaints are consistent with an arterial pathology.  In addition she has normal ankle-brachial indices with normal toe pressures.  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  She has been started on Neurontin.  This will need to be adjusted.  She does have some dizziness currently with her dosing so we will have to be careful with that.  Again I do not feel that her leg pain is related to arterial insufficiency.    Leia Alf, MD, FACS [facility] [phone] Pager [phone]

## 2021-03-07 ENCOUNTER — Inpatient Hospital Stay (HOSPITAL_COMMUNITY)
Admission: AD | Admit: 2021-03-07 | Discharge: 2021-03-09 | DRG: 640 | Disposition: A | Discharge status: Home / Self Care | Payer: Medicare Other | Source: Other Acute Inpatient Hospital | Attending: Internal Medicine | Admitting: Internal Medicine

## 2021-03-07 ENCOUNTER — Encounter (HOSPITAL_COMMUNITY): Payer: Self-pay | Admitting: Family Medicine

## 2021-03-07 DIAGNOSIS — E8771 Transfusion associated circulatory overload: Secondary | ICD-10-CM | POA: Diagnosis present

## 2021-03-07 DIAGNOSIS — I10 Essential (primary) hypertension: Secondary | ICD-10-CM | POA: Diagnosis not present

## 2021-03-07 DIAGNOSIS — I5033 Acute on chronic diastolic (congestive) heart failure: Secondary | ICD-10-CM | POA: Diagnosis not present

## 2021-03-07 DIAGNOSIS — J9601 Acute respiratory failure with hypoxia: Secondary | ICD-10-CM | POA: Diagnosis present

## 2021-03-07 DIAGNOSIS — Z823 Family history of stroke: Secondary | ICD-10-CM | POA: Diagnosis not present

## 2021-03-07 DIAGNOSIS — Z9115 Patient's noncompliance with renal dialysis: Secondary | ICD-10-CM

## 2021-03-07 DIAGNOSIS — Z79890 Hormone replacement therapy (postmenopausal): Secondary | ICD-10-CM

## 2021-03-07 DIAGNOSIS — Y848 Other medical procedures as the cause of abnormal reaction of the patient, or of later complication, without mention of misadventure at the time of the procedure: Secondary | ICD-10-CM | POA: Diagnosis present

## 2021-03-07 DIAGNOSIS — R0602 Shortness of breath: Secondary | ICD-10-CM

## 2021-03-07 DIAGNOSIS — I132 Hypertensive heart and chronic kidney disease with heart failure and with stage 5 chronic kidney disease, or end stage renal disease: Secondary | ICD-10-CM | POA: Diagnosis present

## 2021-03-07 DIAGNOSIS — Z90711 Acquired absence of uterus with remaining cervical stump: Secondary | ICD-10-CM

## 2021-03-07 DIAGNOSIS — Z20822 Contact with and (suspected) exposure to covid-19: Secondary | ICD-10-CM | POA: Diagnosis present

## 2021-03-07 DIAGNOSIS — E039 Hypothyroidism, unspecified: Secondary | ICD-10-CM | POA: Diagnosis present

## 2021-03-07 DIAGNOSIS — E785 Hyperlipidemia, unspecified: Secondary | ICD-10-CM | POA: Diagnosis present

## 2021-03-07 DIAGNOSIS — I5032 Chronic diastolic (congestive) heart failure: Secondary | ICD-10-CM | POA: Diagnosis present

## 2021-03-07 DIAGNOSIS — J810 Acute pulmonary edema: Secondary | ICD-10-CM | POA: Diagnosis present

## 2021-03-07 DIAGNOSIS — Z8249 Family history of ischemic heart disease and other diseases of the circulatory system: Secondary | ICD-10-CM

## 2021-03-07 DIAGNOSIS — N186 End stage renal disease: Secondary | ICD-10-CM | POA: Diagnosis present

## 2021-03-07 DIAGNOSIS — D638 Anemia in other chronic diseases classified elsewhere: Secondary | ICD-10-CM | POA: Diagnosis present

## 2021-03-07 DIAGNOSIS — E8889 Other specified metabolic disorders: Secondary | ICD-10-CM | POA: Diagnosis present

## 2021-03-07 DIAGNOSIS — Z87891 Personal history of nicotine dependence: Secondary | ICD-10-CM

## 2021-03-07 DIAGNOSIS — E8779 Other fluid overload: Secondary | ICD-10-CM | POA: Diagnosis present

## 2021-03-07 DIAGNOSIS — Z79899 Other long term (current) drug therapy: Secondary | ICD-10-CM | POA: Diagnosis not present

## 2021-03-07 DIAGNOSIS — N2581 Secondary hyperparathyroidism of renal origin: Secondary | ICD-10-CM | POA: Diagnosis present

## 2021-03-07 DIAGNOSIS — Z85828 Personal history of other malignant neoplasm of skin: Secondary | ICD-10-CM | POA: Diagnosis not present

## 2021-03-07 DIAGNOSIS — Z992 Dependence on renal dialysis: Secondary | ICD-10-CM

## 2021-03-07 DIAGNOSIS — D631 Anemia in chronic kidney disease: Secondary | ICD-10-CM | POA: Diagnosis present

## 2021-03-07 LAB — FERRITIN: Ferritin: 2626 ng/mL — ABNORMAL HIGH (ref 11–307)

## 2021-03-07 LAB — CBC
HCT: 25.9 % — ABNORMAL LOW (ref 36.0–46.0)
Hemoglobin: 8 g/dL — ABNORMAL LOW (ref 12.0–15.0)
MCH: 32.4 pg (ref 26.0–34.0)
MCHC: 30.9 g/dL (ref 30.0–36.0)
MCV: 104.9 fL — ABNORMAL HIGH (ref 80.0–100.0)
Platelets: 148 10*3/uL — ABNORMAL LOW (ref 150–400)
RBC: 2.47 MIL/uL — ABNORMAL LOW (ref 3.87–5.11)
RDW: 18.9 % — ABNORMAL HIGH (ref 11.5–15.5)
WBC: 7.3 10*3/uL (ref 4.0–10.5)
nRBC: 0 % (ref 0.0–0.2)

## 2021-03-07 LAB — IRON AND TIBC
Iron: 27 ug/dL — ABNORMAL LOW (ref 28–170)
Saturation Ratios: 16 % (ref 10.4–31.8)
TIBC: 174 ug/dL — ABNORMAL LOW (ref 250–450)
UIBC: 147 ug/dL

## 2021-03-07 LAB — MAGNESIUM: Magnesium: 2.6 mg/dL — ABNORMAL HIGH (ref 1.7–2.4)

## 2021-03-07 LAB — BASIC METABOLIC PANEL
Anion gap: 11 (ref 5–15)
BUN: 50 mg/dL — ABNORMAL HIGH (ref 8–23)
CO2: 29 mmol/L (ref 22–32)
Calcium: 9.1 mg/dL (ref 8.9–10.3)
Chloride: 99 mmol/L (ref 98–111)
Creatinine, Ser: 9.37 mg/dL — ABNORMAL HIGH (ref 0.44–1.00)
GFR, Estimated: 4 mL/min — ABNORMAL LOW (ref >60)
Glucose, Bld: 105 mg/dL — ABNORMAL HIGH (ref 70–99)
Potassium: 4.8 mmol/L (ref 3.5–5.1)
Sodium: 139 mmol/L (ref 135–145)

## 2021-03-07 LAB — RETICULOCYTES
Immature Retic Fract: 22.6 % — ABNORMAL HIGH (ref 2.3–15.9)
RBC.: 2.51 MIL/uL — ABNORMAL LOW (ref 3.87–5.11)
Retic Count, Absolute: 51 10*3/uL (ref 19.0–186.0)
Retic Ct Pct: 2 % (ref 0.4–3.1)

## 2021-03-07 LAB — FOLATE: Folate: 28.7 ng/mL (ref >5.9)

## 2021-03-07 LAB — VITAMIN B12: Vitamin B-12: 919 pg/mL — ABNORMAL HIGH (ref 180–914)

## 2021-03-07 MED ORDER — HEPARIN SODIUM (PORCINE) 5000 UNIT/ML IJ SOLN
5000.0000 [IU] | Freq: Three times a day (TID) | INTRAMUSCULAR | Status: DC
  Administered 2021-03-07 – 2021-03-09 (×4): 5000 [IU] via SUBCUTANEOUS
  Filled 2021-03-07 (×4): qty 1

## 2021-03-07 MED ORDER — GABAPENTIN 100 MG PO CAPS
100.0000 mg | ORAL_CAPSULE | Freq: Every morning | ORAL | Status: DC
  Administered 2021-03-08 – 2021-03-09 (×2): 100 mg via ORAL
  Filled 2021-03-07 (×2): qty 1

## 2021-03-07 MED ORDER — AMLODIPINE BESYLATE 10 MG PO TABS
10.0000 mg | ORAL_TABLET | Freq: Every day | ORAL | Status: DC
  Administered 2021-03-07: 10 mg via ORAL
  Filled 2021-03-07: qty 1

## 2021-03-07 MED ORDER — ONDANSETRON HCL 4 MG PO TABS: 4.0000 mg | ORAL_TABLET | Freq: Four times a day (QID) | ORAL | Status: DC | PRN

## 2021-03-07 MED ORDER — RALOXIFENE HCL 60 MG PO TABS
60.0000 mg | ORAL_TABLET | Freq: Every day | ORAL | Status: DC
  Administered 2021-03-08 – 2021-03-09 (×2): 60 mg via ORAL
  Filled 2021-03-07 (×3): qty 1

## 2021-03-07 MED ORDER — ATORVASTATIN CALCIUM 80 MG PO TABS
80.0000 mg | ORAL_TABLET | Freq: Every day | ORAL | Status: DC
  Administered 2021-03-08 – 2021-03-09 (×2): 80 mg via ORAL
  Filled 2021-03-07 (×2): qty 1

## 2021-03-07 MED ORDER — CALCIUM ACETATE (PHOS BINDER) 667 MG PO CAPS
2001.0000 mg | ORAL_CAPSULE | Freq: Two times a day (BID) | ORAL | Status: DC
  Administered 2021-03-08 – 2021-03-09 (×3): 2001 mg via ORAL
  Filled 2021-03-07 (×3): qty 3

## 2021-03-07 MED ORDER — CARVEDILOL 12.5 MG PO TABS: 12.5000 mg | ORAL_TABLET | ORAL | Status: DC

## 2021-03-07 MED ORDER — FUROSEMIDE 10 MG/ML IJ SOLN
80.0000 mg | Freq: Once | INTRAMUSCULAR | Status: AC
  Administered 2021-03-07: 80 mg via INTRAVENOUS
  Filled 2021-03-07: qty 8

## 2021-03-07 MED ORDER — ACETAMINOPHEN 650 MG RE SUPP: 650.0000 mg | Freq: Four times a day (QID) | RECTAL | Status: DC | PRN

## 2021-03-07 MED ORDER — SENNOSIDES-DOCUSATE SODIUM 8.6-50 MG PO TABS: 1.0000 | ORAL_TABLET | Freq: Every evening | ORAL | Status: DC | PRN

## 2021-03-07 MED ORDER — PANTOPRAZOLE SODIUM 40 MG PO TBEC
40.0000 mg | DELAYED_RELEASE_TABLET | Freq: Two times a day (BID) | ORAL | Status: DC
  Administered 2021-03-07 – 2021-03-08 (×2): 40 mg via ORAL
  Filled 2021-03-07 (×2): qty 1

## 2021-03-07 MED ORDER — ACETAMINOPHEN 325 MG PO TABS: 650.0000 mg | ORAL_TABLET | Freq: Four times a day (QID) | ORAL | Status: DC | PRN

## 2021-03-07 MED ORDER — LEVOTHYROXINE SODIUM 75 MCG PO TABS
75.0000 ug | ORAL_TABLET | Freq: Every day | ORAL | Status: DC
  Administered 2021-03-08 – 2021-03-09 (×2): 75 ug via ORAL
  Filled 2021-03-07 (×2): qty 1

## 2021-03-07 MED ORDER — ONDANSETRON HCL 4 MG/2ML IJ SOLN
4.0000 mg | Freq: Four times a day (QID) | INTRAMUSCULAR | Status: DC | PRN
Start: 2021-03-07 — End: 2021-03-09

## 2021-03-07 NOTE — H&P (Signed)
History and Physical    KELVIN BURPEE RWE:[id number] DOB: [birth year] DOA: 03/07/2021  PCP: [place]   Patient coming from: Home   Chief Complaint: general malaise   HPI: DEMESHIA Lopez is a 80 y.o. female with medical history significant for ESRD on hemodialysis, chronic diastolic CHF, hypertension, hypothyroidism, and chronic anemia who presented to the emergency department yesterday with general malaise.  Patient had been feeling generally poor for several days, was initially seen by her PCP and had a urinalysis and urine culture, was given a shot of Rocephin, and later notified that urine was not suggestive of infection.  She completed dialysis on 03/04/2021 and reports adherence with her fluid restriction and renal diet.  She has not been having chest pain.  She reports that she typically coughs some in the morning but this has not changed recently.  [facility] ED Course: Upon arrival to the ED, patient is found to have a hemoglobin of 7.6, down from 11.5 in early February.  She had mild pulmonary edema on chest x-ray.  FOBT was negative.  She was given 1 unit of RBC in the ED, developed acute respiratory distress with blood pressure 619 systolic, heart rate up to 140s, tachypnea, and increased multifocal interstitial opacities on chest x-ray.  She was placed on BiPAP and given 80 mg IV Lasix.  She was also given 20 mg IV labetalol.  She began to diurese, vital signs and dyspnea improved, and she was transferred to [facility] on 4 L/min of supplemental oxygen.  Review of Systems:  All other systems reviewed and apart from HPI, are negative.  Past Medical History:  Diagnosis Date  . Anemia of renal disease   . Arthritis    osteoarthritis  . Cancer ([place])    skin- basal- 7 times and location  . Dyspnea    when climbing stairs  . ESRD (end stage renal disease) ([place])    hemodialysis MWF at [facility] kidney center  . GERD (gastroesophageal reflux  disease)   . Heart murmur    "Nothing to be concerned about, the heart Dr said."  . Hyperkalemia   . Hyperlipidemia   . Hyperphosphatemia   . Hypertension    Essential with goal blood pressure less than 130/80, 12/22/2019-   . Hypokalemia   . Hypoparathyroidism ([place])   . Hypothyroidism   . Membranous glomerulonephritis   . Proteinuria   . S/P partial hysterectomy   . Seasonal allergies   . Secondary hyperparathyroidism, renal ([place])   . Wears glasses     Past Surgical History:  Procedure Laterality Date  . A/V FISTULAGRAM N/A 02/10/2020   Procedure: A/V FISTULAGRAM - Left Arm;  Surgeon: Marty Heck, MD;  Location: [place] [facility];  Service: Cardiovascular;  Laterality: N/A;  . ABDOMINAL HYSTERECTOMY     PARTIAL  . AV FISTULA PLACEMENT Right 08/06/2017   Procedure: ARTERIOVENOUS (AV) FISTULA CREATION RIGHT ARM;  Surgeon: Elam Dutch, MD;  Location[facility] OR;  Service: Vascular;  Laterality: Right;  . AV FISTULA PLACEMENT Right 08/20/2017   Procedure: INSERTION OF ARTERIOVENOUS (AV) GORE-TEX STRETCH GRAFT INTO RIGHT ARM;  Surgeon: Elam Dutch, MD;  Location: [facility] OR;  Service: Vascular;  Laterality: Right;  . AV FISTULA PLACEMENT Left 05/29/2019   Procedure: ARTERIOVENOUS (AV) FISTULA CREATION LEFT ARM;  Surgeon: Serafina Mitchell, MD;  Location: [place];  Service: Vascular;  Laterality: Left;  . AV FISTULA PLACEMENT Left 12/23/2019   Procedure:  INSERTION OF LEFT ARM ARTERIOVENOUS (AV) artegraft;  Surgeon: Serafina Mitchell, MD;  Location: [place];  Service: Vascular;  Laterality: Left;  . BASCILIC VEIN TRANSPOSITION Left 07/22/2019   Procedure: SECOND STAGE BASILIC VEIN TRANSPOSITION LEFT ARM;  Surgeon: Serafina Mitchell, MD;  [facility]: [facility];  Service: Vascular;  Laterality: Left;  . COLONOSCOPY    . INSERTION OF DIALYSIS CATHETER N/A 08/17/2017   Procedure: INSERTION OF DIALYSIS CATHETER;  Surgeon: Rosetta Posner, MD;  Location: [place];  Service: Vascular;  Laterality: N/A;  .  INSERTION OF DIALYSIS CATHETER Right 09/21/2018   Procedure: INSERTION OF DIALYSIS CATHETER;  Surgeon: Marty Heck, MD;  Location: [place];  Service: Vascular;  Laterality: Right;  . REVISION OF ARTERIOVENOUS GORETEX GRAFT Right 07/07/2018   Procedure: REVISION OF ARTERIOVENOUS GORETEX GRAFT;  Surgeon: Elam Dutch, MD;  Location: [place];  Service: Vascular;  Laterality: Right;  . SKIN BIOPSY      SKIN CANCER 6 AREAS  . THROMBECTOMY AND REVISION OF ARTERIOVENTOUS (AV) GORETEX  GRAFT Right 09/17/2018   Procedure: THROMBECTOMY AND REVISION OF ARTERIOVENTOUS (AV) GORETEX  GRAFT;  Surgeon: Serafina Mitchell, MD;  Location: [place] OR;  Service: Vascular;  Laterality: Right;  . TUBUALIGATION      Social History:   reports that she quit smoking about 25 years ago. Her smoking use included cigarettes. She has never used smokeless tobacco. She reports that she does not drink alcohol and does not use drugs.  No Known Allergies  Family History  Problem Relation Age of Onset  . Stroke Mother   . Heart attack Father   . Heart attack Sister      Prior to Admission medications   Medication Sig Start Date End Date Taking? Authorizing Provider  acetaminophen (TYLENOL) 500 MG tablet Take 500 mg by mouth every 6 (six) hours as needed for mild pain or headache.   Yes [provider]  amLODipine (NORVASC) 10 MG tablet Take 10 mg by mouth at bedtime.  05/11/19  Yes [provider]  atorvastatin (LIPITOR) 80 MG tablet Take 80 mg by mouth daily with breakfast.    Yes [provider]  calcium acetate (PHOSLO) 667 MG capsule Take 2 capsules (1,334 mg total) by mouth 3 (three) times daily with meals. Patient taking differently: Take 2,001 mg by mouth 2 (two) times daily with a meal. 08/21/17  Yes Rosita Fire, MD  carvedilol (COREG) 12.5 MG tablet Take 12.5-25 mg by mouth See admin instructions. Take 1 tablet (12.5mg ) by mouth in the evening ONLY on Tuesdays, Thursdays, &  Saturdays. Take 1 tablet (12.5 mg) by mouth twice daily on Sundays, Mondays, Wednesdays, & Fridays.   Yes [provider]  gabapentin (NEURONTIN) 100 MG capsule Take 100 mg by mouth daily. 05/04/20  Yes [provider]  levothyroxine (SYNTHROID, LEVOTHROID) 75 MCG tablet Take 75 mcg by mouth daily before breakfast.   Yes [provider]  lidocaine-prilocaine (EMLA) cream Apply 1 application topically See admin instructions. Apply small amount to access site 1 to 2 hours prior to dialysis 06/30/18  Yes [provider]  loratadine (CLARITIN) 10 MG tablet Take 10 mg by mouth daily.    Yes [provider]  Multiple Vitamin (MULTIVITAMIN WITH MINERALS) TABS tablet Take 1 tablet by mouth daily.    Yes [provider]  omeprazole (PRILOSEC) 20 MG capsule Take 20 mg by mouth 2 (two) times daily before a meal.    Yes [provider]  raloxifene (EVISTA) 60 MG tablet Take 60 mg by mouth daily with breakfast.    Yes [provider]    Physical Exam: There were no vitals filed for this visit.  Constitutional: NAD, calm  Eyes: PERTLA, lids and conjunctivae normal ENMT: Mucous membranes are moist. Posterior pharynx clear of any exudate or lesions.   Neck: normal, supple, no masses, no thyromegaly Respiratory: Mild tachypnea, speaking full sentence, no wheezing. No accessory muscle use.  Cardiovascular: S1 & S2 heard, regular rate and rhythm. No extremity edema.  Abdomen: No distension, no tenderness, soft. Bowel sounds active.  Musculoskeletal: no clubbing / cyanosis. No joint deformity upper and lower extremities.   Skin: no significant rashes, lesions, ulcers. Warm, dry, well-perfused. Neurologic: CN 2-12 grossly intact. Sensation intact. Moving all extremities. Resting tremor.  Psychiatric: Alert and oriented to person, place, and situation. Very pleasant and cooperative.   Labs and Imaging on Admission: I have personally reviewed  following labs and imaging studies  CBC: No results for input(s): WBC, NEUTROABS, HGB, HCT, MCV, PLT in the last 168 hours. Basic Metabolic Panel: No results for input(s): NA, K, CL, CO2, GLUCOSE, BUN, CREATININE, CALCIUM, MG, PHOS in the last 168 hours. GFR: CrCl cannot be calculated (Patient's most recent lab result is older than the maximum 21 days allowed.). Liver Function Tests: No results for input(s): AST, ALT, ALKPHOS, BILITOT, PROT, ALBUMIN in the last 168 hours. No results for input(s): LIPASE, AMYLASE in the last 168 hours. No results for input(s): AMMONIA in the last 168 hours. Coagulation Profile: No results for input(s): INR, PROTIME in the last 168 hours. Cardiac Enzymes: No results for input(s): CKTOTAL, CKMB, CKMBINDEX, TROPONINI in the last 168 hours. BNP (last 3 results) No results for input(s): PROBNP in the last 8760 hours. HbA1C: No results for input(s): HGBA1C in the last 72 hours. CBG: No results for input(s): GLUCAP in the last 168 hours. Lipid Profile: No results for input(s): CHOL, HDL, LDLCALC, TRIG, CHOLHDL, LDLDIRECT in the last 72 hours. Thyroid Function Tests: No results for input(s): TSH, T4TOTAL, FREET4, T3FREE, THYROIDAB in the last 72 hours. Anemia Panel: No results for input(s): VITAMINB12, FOLATE, FERRITIN, TIBC, IRON, RETICCTPCT in the last 72 hours. Urine analysis: No results found for: COLORURINE, APPEARANCEUR, LABSPEC, PHURINE, GLUCOSEU, HGBUR, BILIRUBINUR, KETONESUR, PROTEINUR, UROBILINOGEN, NITRITE, LEUKOCYTESUR Sepsis Labs: @LABRCNTIP (procalcitonin:4,lacticidven:4) )No results found for this or any previous visit (from the past 240 hour(s)).   Radiological Exams on Admission: No results found.  EKG: Independently reviewed. Sinus tachycardia, rate 145.   Assessment/Plan   1. Acute hypoxic respiratory failure; TACO   - Developed acute respiratory distress with hypoxia in ED after blood transfusion, was given 80 mg IV Lasix and  started on BiPAP, made dramatic improvement and now breathing comfortably with saturation 100% on 4 Lpm  - Give additional dose of Lasix now, continue to titrate down FiO2 as tolerated, consult nephrology in am for HD     2. ESRD - Last completed HD on 03/04/21  - She has new supplemental O2 requirement but improved with Lasix in ED and not in any distress  - Restrict fluids, continue phosphate binder, consult nephrology in am    3. Acute on chronic anemia  - Hgb was 7.6 in ED, down from 11.5  - Patient had not noticed any bleeding and FOBT was negative in ED  - She was transfused 1 unit RBC in ED  - Monitor H&H, check anemia panel    4. Hypertension  -  Continue Norvasc and Coreg    5. Hypothyroidism  - Continue Synthroid     DVT prophylaxis: sq heparin  Code Status: Full, confirmed with patient  Level of Care: Level of care: Progressive Family Communication: none present  Disposition Plan:  Patient is from: Home  Anticipated d/c is to: TBD Anticipated d/c date is: 4/28 or 03/10/21 Patient currently: Pending improvement in respiratory status, monitoring of H&H Consults called: None  Admission status: Inpatient     Vianne Bulls, MD [facility]  03/07/2021, 9:07 PM

## 2021-03-08 ENCOUNTER — Other Ambulatory Visit: Payer: Self-pay

## 2021-03-08 ENCOUNTER — Inpatient Hospital Stay (HOSPITAL_COMMUNITY): Payer: Medicare Other

## 2021-03-08 DIAGNOSIS — J9601 Acute respiratory failure with hypoxia: Secondary | ICD-10-CM | POA: Diagnosis not present

## 2021-03-08 LAB — CBC
HCT: 24.5 % — ABNORMAL LOW (ref 36.0–46.0)
HCT: 25.9 % — ABNORMAL LOW (ref 36.0–46.0)
Hemoglobin: 7.6 g/dL — ABNORMAL LOW (ref 12.0–15.0)
Hemoglobin: 8.1 g/dL — ABNORMAL LOW (ref 12.0–15.0)
MCH: 32.3 pg (ref 26.0–34.0)
MCH: 32.1 pg (ref 26.0–34.0)
MCHC: 31 g/dL (ref 30.0–36.0)
MCHC: 31.3 g/dL (ref 30.0–36.0)
MCV: 104.3 fL — ABNORMAL HIGH (ref 80.0–100.0)
MCV: 102.8 fL — ABNORMAL HIGH (ref 80.0–100.0)
Platelets: 148 10*3/uL — ABNORMAL LOW (ref 150–400)
Platelets: 156 10*3/uL (ref 150–400)
RBC: 2.35 MIL/uL — ABNORMAL LOW (ref 3.87–5.11)
RBC: 2.52 MIL/uL — ABNORMAL LOW (ref 3.87–5.11)
RDW: 18.6 % — ABNORMAL HIGH (ref 11.5–15.5)
RDW: 17.4 % — ABNORMAL HIGH (ref 11.5–15.5)
WBC: 6.7 10*3/uL (ref 4.0–10.5)
WBC: 7.8 10*3/uL (ref 4.0–10.5)
nRBC: 0 % (ref 0.0–0.2)
nRBC: 0 % (ref 0.0–0.2)

## 2021-03-08 LAB — TYPE AND SCREEN
ABO/RH(D): O POS
Antibody Screen: NEGATIVE

## 2021-03-08 LAB — BASIC METABOLIC PANEL
Anion gap: 13 (ref 5–15)
BUN: 53 mg/dL — ABNORMAL HIGH (ref 8–23)
CO2: 29 mmol/L (ref 22–32)
Calcium: 9.4 mg/dL (ref 8.9–10.3)
Chloride: 97 mmol/L — ABNORMAL LOW (ref 98–111)
Creatinine, Ser: 9.89 mg/dL — ABNORMAL HIGH (ref 0.44–1.00)
GFR, Estimated: 4 mL/min — ABNORMAL LOW (ref >60)
Glucose, Bld: 102 mg/dL — ABNORMAL HIGH (ref 70–99)
Potassium: 4.9 mmol/L (ref 3.5–5.1)
Sodium: 139 mmol/L (ref 135–145)

## 2021-03-08 LAB — MRSA PCR SCREENING: MRSA by PCR: NEGATIVE

## 2021-03-08 LAB — SARS CORONAVIRUS 2 (TAT 6-24 HRS): SARS Coronavirus 2: NEGATIVE

## 2021-03-08 LAB — TSH: TSH: 2.172 u[IU]/mL (ref 0.350–4.500)

## 2021-03-08 LAB — LACTATE DEHYDROGENASE: LDH: 210 U/L — ABNORMAL HIGH (ref 98–192)

## 2021-03-08 MED ORDER — PENTAFLUOROPROP-TETRAFLUOROETH EX AERO: 1.0000 "application " | INHALATION_SPRAY | CUTANEOUS | Status: DC | PRN

## 2021-03-08 MED ORDER — ALTEPLASE 2 MG IJ SOLR: 2.0000 mg | Freq: Once | INTRAMUSCULAR | Status: DC | PRN

## 2021-03-08 MED ORDER — SODIUM CHLORIDE 0.9 % IV SOLN: 100.0000 mL | INTRAVENOUS | Status: DC | PRN

## 2021-03-08 MED ORDER — LIDOCAINE-PRILOCAINE 2.5-2.5 % EX CREA
1.0000 "application " | TOPICAL_CREAM | CUTANEOUS | Status: DC | PRN
  Filled 2021-03-08: qty 5

## 2021-03-08 MED ORDER — PANTOPRAZOLE SODIUM 40 MG IV SOLR
40.0000 mg | Freq: Two times a day (BID) | INTRAVENOUS | Status: DC
  Administered 2021-03-08 – 2021-03-09 (×3): 40 mg via INTRAVENOUS
  Filled 2021-03-08 (×4): qty 40

## 2021-03-08 MED ORDER — HEPARIN SODIUM (PORCINE) 1000 UNIT/ML DIALYSIS: 1000.0000 [IU] | INTRAMUSCULAR | Status: DC | PRN

## 2021-03-08 MED ORDER — CARVEDILOL 25 MG PO TABS
25.0000 mg | ORAL_TABLET | Freq: Two times a day (BID) | ORAL | Status: DC
  Administered 2021-03-08: 25 mg via ORAL
  Filled 2021-03-08 (×2): qty 1

## 2021-03-08 MED ORDER — CHLORHEXIDINE GLUCONATE CLOTH 2 % EX PADS
6.0000 | MEDICATED_PAD | Freq: Every day | CUTANEOUS | Status: DC
  Administered 2021-03-09: 6 via TOPICAL

## 2021-03-08 MED ORDER — LIDOCAINE HCL (PF) 1 % IJ SOLN
5.0000 mL | INTRAMUSCULAR | Status: DC | PRN
  Filled 2021-03-08: qty 5

## 2021-03-08 MED ORDER — CHLORHEXIDINE GLUCONATE CLOTH 2 % EX PADS
6.0000 | MEDICATED_PAD | Freq: Every day | CUTANEOUS | Status: DC
  Administered 2021-03-08: 6 via TOPICAL

## 2021-03-08 NOTE — Consult Note (Signed)
Indication for Consultation:  Management of ESRD/hemodialysis, anemia, hypertension/volume, and secondary hyperparathyroidism. PCP:  HPI: Latoya Lopez is a 80 y.o. female with PMH significant for ESRD on HD, CHF, HTN, hypothyroidism, and chronic anemia who was transferred to [facility] due to lack of HD at outside hospital.  [name] presented to [facility] ED c/o general malaise x several days. Her PCP performed urine studies and gave an injection of Rocephin, but later advised her urine was not suggestive of infection. In the [facility] ED, her Hgb was 7.6, down from 11.5 in early February and CXR showed mild pulmonary edema. FOBT (-). 1 unit RBCs was administered, after which she developed acute respiratory distress with blood pressure 275 systolic, heart rate up to 140s, tachypnea, and increased multifocal interstitial opacities on chest x-ray.  She was placed on BiPAP and given 80 mg IV Lasix as well as 20 mg IV labetalol. Upon arrival at [facility] she had improved significantly and was breathing comfortably on 4 Lpm O2.   Upon evaluation this morning, the patient states she feels "a little better". She still has some weakness, particularly when first attempting to stand, but states she was able to ambulate to the bathroom this morning. Her last fall was on 02/10/21 when she attempted to stand from bed; she reports a cut on her right elbow and right hip bruise that were not significant enough for her to seek medical attention at that time. She denies n/v, stating she ate breakfast this morning without issue. She has experienced some dyspnea since her acute incident in the ED, but denies problems with dyspnea or orthopnea prior to yesterday and overall feels as though she is not volume overloaded. She does endorse darker stools over the past 1-2 weeks. [facility] last outpatient HD was on 4/23, so HD is planned for today.    Past Medical History:  Diagnosis Date  . Anemia of renal disease   . Arthritis     osteoarthritis  . Cancer ([place])    skin- basal- 7 times and location  . Dyspnea    when climbing stairs  . ESRD (end stage renal disease) ([place])    hemodialysis MWF at [facility] kidney center  . GERD (gastroesophageal reflux disease)   . Heart murmur    "Nothing to be concerned about, the heart Dr said."  . Hyperkalemia   . Hyperlipidemia   . Hyperphosphatemia   . Hypertension    Essential with goal blood pressure less than 130/80, 12/22/2019-   . Hypokalemia   . Hypoparathyroidism ([place])   . Hypothyroidism   . Membranous glomerulonephritis   . Proteinuria   . S/P partial hysterectomy   . Seasonal allergies   . Secondary hyperparathyroidism, renal ([place])   . Wears glasses    Past Surgical History:  Procedure Laterality Date  . A/V FISTULAGRAM N/A 02/10/2020   Procedure: A/V FISTULAGRAM - Left Arm;  Surgeon: Marty Heck, MD;  Location: [place] [facility];  Service: Cardiovascular;  Laterality: N/A;  . ABDOMINAL HYSTERECTOMY     PARTIAL  . AV FISTULA PLACEMENT Right 08/06/2017   Procedure: ARTERIOVENOUS (AV) FISTULA CREATION RIGHT ARM;  Surgeon: Elam Dutch, MD;  Location: [facility] OR;  Service: Vascular;  Laterality: Right;  . AV FISTULA PLACEMENT Right 08/20/2017   Procedure: INSERTION OF ARTERIOVENOUS (AV) GORE-TEX STRETCH GRAFT INTO RIGHT ARM;  Surgeon: Elam Dutch, MD;  Location: [facility] OR;  Service: Vascular;  Laterality: Right;  . AV FISTULA PLACEMENT Left 05/29/2019  Procedure: ARTERIOVENOUS (AV) FISTULA CREATION LEFT ARM;  Surgeon: Serafina Mitchell, MD;  Location: [facility] OR;  Service: Vascular;  Laterality: Left;  . AV FISTULA PLACEMENT Left 12/23/2019   Procedure: INSERTION OF LEFT ARM ARTERIOVENOUS (AV) artegraft;  Surgeon: Serafina Mitchell, MD;  Location: [place];  Service: Vascular;  Laterality: Left;  . BASCILIC VEIN TRANSPOSITION Left 07/22/2019   Procedure: SECOND STAGE BASILIC VEIN TRANSPOSITION LEFT ARM;  Surgeon: Serafina Mitchell, MD;  Location: [facility];  Service:  Vascular;  Laterality: Left;  . COLONOSCOPY    . INSERTION OF DIALYSIS CATHETER N/A 08/17/2017   Procedure: INSERTION OF DIALYSIS CATHETER;  Surgeon: Rosetta Posner, MD;  Location: [place];  Service: Vascular;  Laterality: N/A;  . INSERTION OF DIALYSIS CATHETER Right 09/21/2018   Procedure: INSERTION OF DIALYSIS CATHETER;  Surgeon: Marty Heck, MD;  Location: [place];  Service: Vascular;  Laterality: Right;  . REVISION OF ARTERIOVENOUS GORETEX GRAFT Right 07/07/2018   Procedure: REVISION OF ARTERIOVENOUS GORETEX GRAFT;  Surgeon: Elam Dutch, MD;  Location: [place];  Service: Vascular;  Laterality: Right;  . SKIN BIOPSY      SKIN CANCER 6 AREAS  . THROMBECTOMY AND REVISION OF ARTERIOVENTOUS (AV) GORETEX  GRAFT Right 09/17/2018   Procedure: THROMBECTOMY AND REVISION OF ARTERIOVENTOUS (AV) GORETEX  GRAFT;  Surgeon: Serafina Mitchell, MD;  Location: [place] OR;  Service: Vascular;  Laterality: Right;  . TUBUALIGATION     Family History  Problem Relation Age of Onset  . Stroke Mother   . Heart attack Father   . Heart attack Sister    Social History:  reports that she quit smoking about 25 years ago. Her smoking use included cigarettes. She has never used smokeless tobacco. She reports that she does not drink alcohol and does not use drugs.  ROS: As per HPI otherwise negative.  Review of Systems: Gen: (+) weakness. Denies any fever, chills, fatigue, weight loss, and/or sleep disorders. HEENT: Denies blurred vision, sore throat or epistaxis. No sinusitis.   CV: Denies chest pain, angina, palpitations, orthopnea, PND, peripheral edema, and claudication. Resp: (+) occasional non-productive cough. Denies dyspnea at rest, dyspnea with exercise, sputum, or wheezing. GI: (+) dark stools x 1-2 wks. Denies nausea, vomiting, abdominal pain, constipation or diarrhea. GU: Denies dysuria or hematuria. MS: Denies joint pain, muscle pain or cramps.  Derm: Denies rashes, itching, or ulcerations. Heme:  Denies bruising, bleeding, and enlarged lymph nodes. Neuro: No headache, dizziness.  Physical Exam: Vitals:   03/08/21 0302 03/08/21 0400 03/08/21 0630 03/08/21 0757  BP:  (!) 142/60  132/74  Pulse:  (!) 105  (!) 103  Resp:  18  20  Temp:  98.1 F (36.7 C)  98.5 F (36.9 C)  TempSrc:  Oral  Oral  SpO2:  95%  95%  Weight: 72.3 kg  72.3 kg   Height:         General: Well developed, appears appropriate for age, in no acute distress. Head: Normocephalic, atraumatic, sclera non-icteric, mucus membranes are moist. Neck: Supple without lymphadenopathy/masses. JVD not elevated. Lungs: Clear bilaterally to auscultation without wheezes, rales, or rhonchi. Breathing is unlabored. Heart: RRR with normal S1, S2. (+) 3/6 systolic murmur present. No rubs appreciated. Abdomen: Soft, non-tender, non-distended with normoactive bowel sounds. No rebound/guarding. No obvious abdominal masses. Musculoskeletal:  Strength and tone appear normal for age. Lower extremities: No edema or ischemic changes, no open wounds. Neuro: Alert and oriented X 3. Moves all extremities spontaneously. Psych:  Responds to questions appropriately with a normal affect. Dialysis Access: Left forearm AV graft, good bruits, (+) distal CMS  No Known Allergies Prior to Admission medications   Medication Sig Start Date End Date Taking? Authorizing Provider  acetaminophen (TYLENOL) 500 MG tablet Take 500 mg by mouth every 6 (six) hours as needed for mild pain or headache.   Yes [provider]  amLODipine (NORVASC) 10 MG tablet Take 10 mg by mouth at bedtime.  05/11/19  Yes [provider]  atorvastatin (LIPITOR) 80 MG tablet Take 80 mg by mouth daily with breakfast.    Yes [provider]  calcium acetate (PHOSLO) 667 MG capsule Take 2 capsules (1,334 mg total) by mouth 3 (three) times daily with meals. Patient taking differently: Take 2,001 mg by mouth 2 (two) times daily with a meal. 08/21/17  Yes  Rosita Fire, MD  carvedilol (COREG) 12.5 MG tablet Take 12.5-25 mg by mouth See admin instructions. Take 1 tablet (12.5mg ) by mouth in the evening ONLY on Tuesdays, Thursdays, & Saturdays. Take 1 tablet (12.5 mg) by mouth twice daily on Sundays, Mondays, Wednesdays, & Fridays.   Yes [provider]  gabapentin (NEURONTIN) 100 MG capsule Take 100 mg by mouth daily. 05/04/20  Yes [provider]  levothyroxine (SYNTHROID, LEVOTHROID) 75 MCG tablet Take 75 mcg by mouth daily before breakfast.   Yes [provider]  lidocaine-prilocaine (EMLA) cream Apply 1 application topically See admin instructions. Apply small amount to access site 1 to 2 hours prior to dialysis 06/30/18  Yes [provider]  loratadine (CLARITIN) 10 MG tablet Take 10 mg by mouth daily.    Yes [provider]  Multiple Vitamin (MULTIVITAMIN WITH MINERALS) TABS tablet Take 1 tablet by mouth daily.    Yes [provider]  omeprazole (PRILOSEC) 20 MG capsule Take 20 mg by mouth 2 (two) times daily before a meal.    Yes [provider]  raloxifene (EVISTA) 60 MG tablet Take 60 mg by mouth daily with breakfast.    Yes [provider]   Current Facility-Administered Medications  Medication Dose Route Frequency Provider Last Rate Last Admin  . acetaminophen (TYLENOL) tablet 650 mg  650 mg Oral Q6H PRN Opyd, Ilene Qua, MD       Or  . acetaminophen (TYLENOL) suppository 650 mg  650 mg Rectal Q6H PRN Opyd, Ilene Qua, MD      . amLODipine (NORVASC) tablet 10 mg  10 mg Oral QHS Opyd, Ilene Qua, MD   10 mg at 03/07/21 2250  . atorvastatin (LIPITOR) tablet 80 mg  80 mg Oral Q breakfast Opyd, Ilene Qua, MD   80 mg at 03/08/21 0809  . calcium acetate (PHOSLO) capsule 2,001 mg  2,001 mg Oral BID WC Opyd, Ilene Qua, MD   2,001 mg at 03/08/21 0809  . [START ON 03/09/2021] carvedilol (COREG) tablet 12.5 mg  12.5 mg Oral Q T,Th,Sat-1800 Opyd, Timothy S, MD      . carvedilol  (COREG) tablet 12.5 mg  12.5 mg Oral 2 times per day on Sun Mon Wed Fri Vianne Bulls, MD      . Chlorhexidine Gluconate Cloth 2 % PADS 6 each  6 each Topical Daily Thurnell Lose, MD   6 each at 03/08/21 [phone]  . gabapentin (NEURONTIN) capsule 100 mg  100 mg Oral q morning Opyd, Ilene Qua, MD   100 mg at 03/08/21 0809  . heparin injection 5,000 Units  5,000 Units  Subcutaneous Q8H Vianne Bulls, MD   5,000 Units at 03/08/21 [phone]  . levothyroxine (SYNTHROID) tablet 75 mcg  75 mcg Oral QAC breakfast Opyd, Ilene Qua, MD   75 mcg at 03/08/21 0552  . ondansetron (ZOFRAN) tablet 4 mg  4 mg Oral Q6H PRN Opyd, Ilene Qua, MD       Or  . ondansetron (ZOFRAN) injection 4 mg  4 mg Intravenous Q6H PRN Opyd, Ilene Qua, MD      . pantoprazole (PROTONIX) EC tablet 40 mg  40 mg Oral BID Opyd, Ilene Qua, MD   40 mg at 03/08/21 0809  . raloxifene (EVISTA) tablet 60 mg  60 mg Oral Q breakfast Opyd, Ilene Qua, MD   60 mg at 03/08/21 0810  . senna-docusate (Senokot-S) tablet 1 tablet  1 tablet Oral QHS PRN Vianne Bulls, MD       Labs: Basic Metabolic Panel: Recent Labs  Lab 03/07/21 2105 03/08/21 0416  NA 139 139  K 4.8 4.9  CL 99 97*  CO2 29 29  GLUCOSE 105* 102*  BUN 50* 53*  CREATININE 9.37* 9.89*  CALCIUM 9.1 9.4   Liver Function Tests: No results for input(s): AST, ALT, ALKPHOS, BILITOT, PROT, ALBUMIN in the last 168 hours. No results for input(s): LIPASE, AMYLASE in the last 168 hours. No results for input(s): AMMONIA in the last 168 hours. CBC: Recent Labs  Lab 03/07/21 2105 03/08/21 0416  WBC 7.3 6.7  HGB 8.0* 7.6*  HCT 25.9* 24.5*  MCV 104.9* 104.3*  PLT 148* 148*   Cardiac Enzymes: No results for input(s): CKTOTAL, CKMB, CKMBINDEX, TROPONINI in the last 168 hours. CBG: No results for input(s): GLUCAP in the last 168 hours. Iron Studies:  Recent Labs    03/07/21 2105  IRON 27*  TIBC 174*  FERRITIN 2,626*   Studies/Results: No results found.  Dialysis Orders:  Center: [facility]  on TTS, 3 hr, BFR 400, DFR 500, 2K/2Ca, EDW 70 kg. Access: AV graft left forearm Hectorol 3 mcg tiw  Assessment/Plan: 1.  Acute hypoxic respiratory failure: developed acute respiratory distress with hypoxia in ED after blood transfusion, significant improvement after treatment. Managed by hospitalist with Lasix and goal to titrate FiO2 down as tolerated. 2.  ESRD: HD planned for today since last tx was 4/23.  3.  Hypertension/volume: BP has ranged 123/57 - 147/72 since admission. Appears euvolemic, although some concern for intravascular hypervolemia due to pulmonary edema and continued O2 requirement. 4.  Anemia: Hgb 7.6, down from 8.0 on 4/26. Macrocytic MCV 104.3. Ferritin elevated at 2,626; iron 27; Sat 16%. Folate & B12 normal. FOBT was negative at [facility] ED, but recommend repeat test here since patient endorses recent history of dark stools. 5.  Metabolic bone disease: Ca 9.4 4/26. Most recent Phos 5.2 and PTH 915 on 4/14. Will increase outpatient Hectorol dose to 4 mcg IV with dialysis; continue home Phoslo 2,001 mg bid with meals. 6.  Nutrition:  Albumin 4.0 on 4/14. Renal diet, fluid restriction.   Teofilo Pod 03/08/2021, 8:38 AM

## 2021-03-08 NOTE — Progress Notes (Addendum)
PROGRESS NOTE                                                                                                                                                                                                             Patient Demographics:    Latoya Lopez, is a 80 y.o. female, DOB - February 24, 1941, MKL:[id number]  Outpatient Primary MD for the patient is [place] date - 03/07/2021    CC - SOB     Brief Narrative (HPI from H&P) - : Latoya Lopez is a 80 y.o. female with medical history significant for ESRD on hemodialysis, chronic diastolic CHF, hypertension, hypothyroidism, and chronic anemia who presented to the emergency department yesterday with general malaise.  Patient had been feeling generally poor for several days, was initially seen by her PCP and had a urinalysis and urine culture, was given a shot of Rocephin, and later notified that urine was not suggestive of infection, due to her multiple doctor visits she missed day of dialysis left some fluid overload causing shortness of breath, she was subsequently seen at [facility] and transferred to [facility] for admission and dialysis.     Subjective:    Connor Foxworthy today has, No headache, No chest pain, No abdominal pain - No Nausea, No new weakness tingling or numbness, mild SOB.   Assessment  & Plan :    1. Acute hypoxic respiratory failure; -  Developed acute respiratory distress with hypoxia in ED after blood transfusion, and missed dialysis with fluid overload, initially received Lasix and BiPAP at [facility] ER after which she is much better, likely from fluid overload due to missed dialysis, HD for fluid removal, rule out COVID, monitor.     2. ESRD -  Last completed HD on 03/04/21 , renal consulted for HD.   3. Acute on chronic anemia - AOCD, FOBT -ve, no abdominal pain no frank bleeding, received 1 unit of packed  RBC in the ER, switch to IV PPI, type screen, check LDH and haptoglobin, monitor H&H, try to keep hemoglobin above 7.   4. Hypertension  - since she stays tachycardic will uptitrate Coreg and hold Norvasc.  5. Hypothyroidism - Continue Synthroid , check TSH.        Condition -  Guarded  Family Communication  : Daughter Lynelle Smoke [phone] 03/08/21  Code Status :  Full  Consults  :  Renal  PUD Prophylaxis : PPI   Procedures  :            Disposition Plan  :    Status is: Inpatient  Remains inpatient appropriate because:IV treatments appropriate due to intensity of illness or inability to take PO   Dispo: The patient is from: Home              Anticipated d/c is to: Home              Patient currently is not medically stable to d/c.   Difficult to place patient No  DVT Prophylaxis  :    heparin injection 5,000 Units Start: 03/07/21 2245   Lab Results  Component Value Date   PLT 148 (L) 03/08/2021    Diet :  Diet Order            Diet renal with fluid restriction Fluid restriction: 1200 mL Fluid; Room service appropriate? Yes; Fluid consistency: Thin  Diet effective now                  Inpatient Medications  Scheduled Meds: . atorvastatin  80 mg Oral Q breakfast  . calcium acetate  2,001 mg Oral BID WC  . carvedilol  25 mg Oral BID WC  . Chlorhexidine Gluconate Cloth  6 each Topical Q0600  . gabapentin  100 mg Oral q morning  . heparin  5,000 Units Subcutaneous Q8H  . levothyroxine  75 mcg Oral QAC breakfast  . pantoprazole (PROTONIX) IV  40 mg Intravenous Q12H  . raloxifene  60 mg Oral Q breakfast   Continuous Infusions: . sodium chloride    . sodium chloride     PRN Meds:.sodium chloride, sodium chloride, acetaminophen **OR** acetaminophen, alteplase, heparin, lidocaine (PF), lidocaine-prilocaine, [DISCONTINUED] ondansetron **OR** ondansetron (ZOFRAN) IV, pentafluoroprop-tetrafluoroeth, senna-docusate  Antibiotics  :    Anti-infectives  (From admission, onward)   None       Time Spent in minutes  30   Lala Lund M.D on 03/08/2021 at 11:55 AM  To page go to [email or web address]   [facility] -  Office  [phone]    See all Orders from today for further details    Objective:   Vitals:   03/08/21 0302 03/08/21 0400 03/08/21 0630 03/08/21 0757  BP:  (!) 142/60  132/74  Pulse:  (!) 105  (!) 103  Resp:  18  20  Temp:  98.1 F (36.7 C)  98.5 F (36.9 C)  TempSrc:  Oral  Oral  SpO2:  95%  95%  Weight: 72.3 kg  72.3 kg   Height:        Wt Readings from Last 3 Encounters:  03/08/21 72.3 kg  05/25/20 68.5 kg  02/10/20 70.3 kg     Intake/Output Summary (Last 24 hours) at 03/08/2021 1155 Last data filed at 03/08/2021 0800 Gross per 24 hour  Intake --  Output 800 ml  Net -800 ml     Physical Exam  Awake Alert, No new F.N deficits, Normal affect [place].AT,PERRAL Supple Neck,No JVD, No cervical lymphadenopathy appriciated.  Symmetrical Chest wall movement, Good air movement bilaterally, CTAB RRR,No Gallops,Rubs or new Murmurs, No Parasternal Heave +ve B.Sounds, Abd Soft, No tenderness, No organomegaly appriciated, No rebound - guarding or rigidity. No Cyanosis, Clubbing or edema, No new Rash or bruise       Data Review:  CBC Recent Labs  Lab 03/07/21 2105 03/08/21 0416  WBC 7.3 6.7  HGB 8.0* 7.6*  HCT 25.9* 24.5*  PLT 148* 148*  MCV 104.9* 104.3*  MCH 32.4 32.3  MCHC 30.9 31.0  RDW 18.9* 18.6*    Recent Labs  Lab 03/07/21 2105 03/08/21 0416  NA 139 139  K 4.8 4.9  CL 99 97*  CO2 29 29  GLUCOSE 105* 102*  BUN 50* 53*  CREATININE 9.37* 9.89*  CALCIUM 9.1 9.4  MG 2.6*  --     ------------------------------------------------------------------------------------------------------------------ No results for input(s): CHOL, HDL, LDLCALC, TRIG, CHOLHDL, LDLDIRECT in the last 72 hours.  No results found for:  HGBA1C ------------------------------------------------------------------------------------------------------------------ No results for input(s): TSH, T4TOTAL, T3FREE, THYROIDAB in the last 72 hours.  Invalid input(s): FREET3  Cardiac Enzymes No results for input(s): CKMB, TROPONINI, MYOGLOBIN in the last 168 hours.  Invalid input(s): CK ------------------------------------------------------------------------------------------------------------------ No results found for: BNP  Micro Results Recent Results (from the past 240 hour(s))  MRSA PCR Screening     Status: None   Collection Time: 03/08/21  2:48 AM   Specimen: Nasal Mucosa; Nasopharyngeal  Result Value Ref Range Status   MRSA by PCR NEGATIVE NEGATIVE Final    Comment:        The GeneXpert MRSA Assay (FDA approved for NASAL specimens only), is one component of a comprehensive MRSA colonization surveillance program. It is not intended to diagnose MRSA infection nor to guide or monitor treatment for MRSA infections. Performed at [facility], [place] [address] [place], [place] [address]     Radiology Reports No results found.

## 2021-03-08 NOTE — Plan of Care (Signed)

## 2021-03-09 DIAGNOSIS — J9601 Acute respiratory failure with hypoxia: Secondary | ICD-10-CM | POA: Diagnosis not present

## 2021-03-09 LAB — CBC
HCT: 25.5 % — ABNORMAL LOW (ref 36.0–46.0)
Hemoglobin: 7.9 g/dL — ABNORMAL LOW (ref 12.0–15.0)
MCH: 32.2 pg (ref 26.0–34.0)
MCHC: 31 g/dL (ref 30.0–36.0)
MCV: 104.1 fL — ABNORMAL HIGH (ref 80.0–100.0)
Platelets: 151 10*3/uL (ref 150–400)
RBC: 2.45 MIL/uL — ABNORMAL LOW (ref 3.87–5.11)
RDW: 17.3 % — ABNORMAL HIGH (ref 11.5–15.5)
WBC: 6.8 10*3/uL (ref 4.0–10.5)
nRBC: 0 % (ref 0.0–0.2)

## 2021-03-09 LAB — HEPATITIS B SURFACE ANTIGEN: Hepatitis B Surface Ag: NONREACTIVE

## 2021-03-09 LAB — HEPATITIS B SURFACE ANTIBODY,QUALITATIVE: Hep B S Ab: REACTIVE — AB

## 2021-03-09 MED ORDER — PANTOPRAZOLE SODIUM 40 MG PO TBEC: 40.0000 mg | DELAYED_RELEASE_TABLET | Freq: Two times a day (BID) | ORAL | 0 refills | Status: DC

## 2021-03-09 MED ORDER — DOXERCALCIFEROL 4 MCG/2ML IV SOLN
4.0000 ug | INTRAVENOUS | Status: DC
  Administered 2021-03-09: 4 ug via INTRAVENOUS

## 2021-03-09 MED ORDER — FERROUS SULFATE 325 (65 FE) MG PO TABS: 325.0000 mg | ORAL_TABLET | Freq: Two times a day (BID) | ORAL | 0 refills | Status: DC

## 2021-03-09 MED ORDER — DOXERCALCIFEROL 4 MCG/2ML IV SOLN
INTRAVENOUS | Status: AC
  Filled 2021-03-09: qty 2

## 2021-03-09 NOTE — Evaluation (Signed)
Occupational Therapy Evaluation/Discharge Patient Details Name: Latoya Lopez MRN: [id number] DOB: [birth year] Today's Date: 03/09/2021    History of Present Illness Latoya Lopez is a 80 y.o. female with medical history significant for ESRD on hemodialysis, chronic diastolic CHF, hypertension, hypothyroidism, and chronic anemia who presented to the emergency department yesterday with general malaise.  Patient had been feeling generally poor for several days, was initially seen by her PCP and had a urinalysis and urine culture, was given a shot of Rocephin, and later notified that urine was not suggestive of infection. Mild pulmonary edema noted   Clinical Impression   PTA, pt lives with husband and daughter. Pt reports Modified Independence with ADLs and mobility with recent use of RW this month after fall from bed. Pt shares IADLs with family. Pt presents now with mild deficits in standing balance and cardiopulmonary tolerance but able to demo hallway mobility with RW at Supervision level. No LOB or safety concerns noted. Pt Modified Independent for UB ADLs and Setup for LB ADLs. Anticipate pt not far from baseline and will progress quickly after resolution of pulmonary edema with no further skilled OT services indicated at this time.  Pt typically does not wear O2, 93% on RA at rest. SpO2 87% with hallway mobility, 89% with mobility in room but quickly returns to low 90s with seated rest break. Pt denies SOB.    Follow Up Recommendations  No OT follow up;Supervision - Intermittent    Equipment Recommendations  None recommended by OT    Recommendations for Other Services       Precautions / Restrictions Precautions Precautions: Fall;Other (comment) Precaution Comments: monitor O2 Restrictions Weight Bearing Restrictions: No      Mobility Bed Mobility               General bed mobility comments: sitting EOB on entry    Transfers Overall transfer level: Modified  independent Equipment used: Rolling walker (2 wheeled)             General transfer comment: Mod I for sit to stand from bedside with RW    Balance Overall balance assessment: Needs assistance Sitting-balance support: No upper extremity supported;Feet supported Sitting balance-Leahy Scale: Normal     Standing balance support: Bilateral upper extremity supported;Single extremity supported;During functional activity Standing balance-Leahy Scale: Fair Standing balance comment: fair static standing without support, use of UE support for mobility                           ADL either performed or assessed with clinical judgement   ADL Overall ADL's : Needs assistance/impaired Eating/Feeding: Independent;Sitting Eating/Feeding Details (indicate cue type and reason): sitting EOB on entry with breakfast Grooming: Modified independent;Standing   Upper Body Bathing: Modified independent;Sitting   Lower Body Bathing: Set up;Sit to/from stand   Upper Body Dressing : Modified independent;Sitting   Lower Body Dressing: Set up;Sit to/from stand   Toilet Transfer: Supervision/safety;Ambulation;RW   Toileting- Clothing Manipulation and Hygiene: Set up;Sit to/from stand       Functional mobility during ADLs: Supervision/safety;Rolling walker General ADL Comments: Pt with minor cardiopulmonary deficits, new use of RW this month but very steady with it     Vision Baseline Vision/History: Wears glasses Wears Glasses: At all times Patient Visual Report: No change from baseline Vision Assessment?: No apparent visual deficits     Perception     Praxis      Pertinent Vitals/Pain Pain Assessment: No/denies  pain     Hand Dominance Right (can use both)   Extremity/Trunk Assessment Upper Extremity Assessment Upper Extremity Assessment: Overall WFL for tasks assessed   Lower Extremity Assessment Lower Extremity Assessment: Defer to PT evaluation   Cervical / Trunk  Assessment Cervical / Trunk Assessment: Normal   Communication Communication Communication: No difficulties   Cognition Arousal/Alertness: Awake/alert Behavior During Therapy: WFL for tasks assessed/performed Overall Cognitive Status: Within Functional Limits for tasks assessed                                     General Comments  Received on RA, 93% at rest. 87% with personal pulse ox during hallway mobility, 89% with room mobility and without mask on. Pt denies SOB. Likely due to pulmonary edema, may improve after HD today?    Exercises     Shoulder Instructions      Home Living Family/patient expects to be discharged to:: Private residence Living Arrangements: Spouse/significant other;Children Available Help at Discharge: Family;Available 24 hours/day Type of Home: Mobile home Home Access: Ramped entrance     Home Layout: One level     Bathroom Shower/Tub: Tub/shower unit;Walk-in shower   Bathroom Toilet: Handicapped height (has BSC over toilet)     Home Equipment: Bedside commode;Walker - 2 wheels;Cane - single point;Shower seat;Hand held shower head          Prior Functioning/Environment Level of Independence: Independent;Independent with assistive device(s)        Comments: Recently began using RW intermittently after a fall from bed earlier this month. Typically does not use AD. Independent with ADLs, shares IADLs with daughter/husband        OT Problem List:        OT Treatment/Interventions:      OT Goals(Current goals can be found in the care plan section) Acute Rehab OT Goals Patient Stated Goal: go home today OT Goal Formulation: All assessment and education complete, DC therapy  OT Frequency:     Barriers to D/C:            Co-evaluation              AM-PAC OT "6 Clicks" Daily Activity     Outcome Measure Help from another person eating meals?: None Help from another person taking care of personal grooming?:  None Help from another person toileting, which includes using toliet, bedpan, or urinal?: A Little Help from another person bathing (including washing, rinsing, drying)?: A Little Help from another person to put on and taking off regular upper body clothing?: None Help from another person to put on and taking off regular lower body clothing?: A Little 6 Click Score: 21   End of Session Equipment Utilized During Treatment: Gait belt;Rolling walker Nurse Communication: Mobility status;Other (comment) (O2)  Activity Tolerance: Patient tolerated treatment well Patient left: in bed;with call bell/phone within reach  OT Visit Diagnosis: Muscle weakness (generalized) (M62.81)                Time: 9937-1696 OT Time Calculation (min): 32 min Charges:  OT General Charges $OT Visit: 1 Visit OT Evaluation $OT Eval Low Complexity: 1 Low OT Treatments $Self Care/Home Management : 8-22 mins  Malachy Chamber, OTR/L Acute Rehab Services Office: [phone]  Layla Maw 03/09/2021, 8:11 AM

## 2021-03-09 NOTE — Progress Notes (Signed)
[facility] Progress Note   Subjective:   Latoya Lopez is sitting up on the edge of the bed today on evaluation and reports feeling "much better". She endorses occasional shob when she "talks a lot" or "gets upset", but denies any dyspnea on exertion when she ambulated two laps with her walker this morning. Still currently on [place]. She reports HD went well yesterday, and is agreeable to receiving tx again today to get her back on her TTS schedule.  Objective Vitals:   03/08/21 2337 03/09/21 0400 03/09/21 0444 03/09/21 0814  BP: (!) 125/55 (!) 141/67    Pulse: 92 85    Resp: 17 17    Temp: 98.2 F (36.8 C) 98.2 F (36.8 C)  98.7 F (37.1 C)  TempSrc: Oral Oral  Oral  SpO2: 97% 99%    Weight:   69.4 kg   Height:       Physical Exam General: Sitting on edge of bed, A&Ox3, in no acute distress. Appears much better clinically today. Heart: RRR with normal S1, S2. (+) 3/6 systolic murmur present. No rubs appreciated. Lungs: Unlabored; BS clear & equal bilaterally Abdomen: Soft, non-tender Extremities: Warm, appropriate color, no edema noted Dialysis Access: Left forearm AV graft, good bruits, (+) distal CMS  Additional Objective Labs: Basic Metabolic Panel: Recent Labs  Lab 03/07/21 2105 03/08/21 0416 03/09/21 0109  NA 139 139 136  K 4.8 4.9 4.1  CL 99 97* 95*  CO2 29 29 30   GLUCOSE 105* 102* 96  BUN 50* 53* 23  CREATININE 9.37* 9.89* 5.71*  CALCIUM 9.1 9.4 9.7   Liver Function Tests: No results for input(s): AST, ALT, ALKPHOS, BILITOT, PROT, ALBUMIN in the last 168 hours. No results for input(s): LIPASE, AMYLASE in the last 168 hours. CBC: Recent Labs  Lab 03/07/21 2105 03/08/21 0416 03/08/21 2008 03/09/21 0109  WBC 7.3 6.7 7.8 6.8  HGB 8.0* 7.6* 8.1* 7.9*  HCT 25.9* 24.5* 25.9* 25.5*  MCV 104.9* 104.3* 102.8* 104.1*  PLT 148* 148* 156 151   Blood Culture    Component Value Date/Time   SDES BLOOD RIGHT HAND 12/28/2019 1835   SPECREQUEST   12/28/2019 1835    BOTTLES DRAWN AEROBIC AND ANAEROBIC Blood Culture adequate volume Performed at [facility] [address] [place], [place] [address]    CULT NO GROWTH 5 DAYS 12/28/2019 1835   REPTSTATUS 01/02/2020 FINAL 12/28/2019 1835    Cardiac Enzymes: No results for input(s): CKTOTAL, CKMB, CKMBINDEX, TROPONINI in the last 168 hours. CBG: No results for input(s): GLUCAP in the last 168 hours. Iron Studies:  Recent Labs    03/07/21 2105  IRON 27*  TIBC 174*  FERRITIN 2,626*   @lablastinr3 @ Studies/Results: DG Chest Port 1 View  Result Date: 03/08/2021 CLINICAL DATA:  80 year old female with shortness of breath. EXAM: PORTABLE CHEST 1 VIEW COMPARISON:  Chest radiograph dated 03/06/2021 FINDINGS: There is cardiomegaly with vascular congestion, improved since the prior radiograph. There is trace left pleural effusion and left lung base atelectasis. Infiltrate is less likely. Clinical correlation is recommended. No pneumothorax. Atherosclerotic calcification of the aorta. No acute osseous pathology. IMPRESSION: Cardiomegaly with vascular congestion, improved since the prior radiograph. Electronically Signed   By: Anner Crete M.D.   On: 03/08/2021 19:33   Medications:  . atorvastatin  80 mg Oral Q breakfast  . calcium acetate  2,001 mg Oral BID WC  . carvedilol  25 mg Oral BID WC  . Chlorhexidine Gluconate Cloth  6 each Topical Q0600  . gabapentin  100 mg Oral q morning  . heparin  5,000 Units Subcutaneous Q8H  . levothyroxine  75 mcg Oral QAC breakfast  . pantoprazole (PROTONIX) IV  40 mg Intravenous Q12H  . raloxifene  60 mg Oral Q breakfast    Dialysis Orders: Center: [facility]  on TTS, 3 hr, BFR 400, DFR 500, 2K/2.5Ca, EDW 70 kg. Access: AV graft left forearm Hectorol 3 mcg tiw  Assessment/Plan: 1. Acute hypoxic respiratory failure: developed acute respiratory distress with hypoxia in ED after blood transfusion, significant improvement after treatment.  Managed by hospitalist with Lasix and goal to titrate FiO2 down as tolerated. 2. ESRD: HD received 4/27 for 3.0 hrs without issue; Net UF 3500 mL. Wt 69.4, slightly below EDW 70. Planning for another HD tx today to re-establish outpatient TTS schedule. 3. Hypertension/volume: BP has ranged 123/57 - 147/72 since admission. Appears euvolemic, although some concern for intravascular hypervolemia due to pulmonary edema and continued O2 requirement. Coreg has been increased and Norvasc held due to persistent tachycardia. 4. Anemia: Hgb 8.0 -> 7.6 -> 8.1 -> 7.9. Macrocytic MCV 104.1 Per review of oupt labs, Hgb has been trending down over past 3-4 wks with patient endorsing dark stools over that time period. FOBT was negative at [facility] ED; pending repeat here. 5. Secondary hyperparathyroidism: Ca 9.4 4/26. Most recent Phos 5.2 and PTH 915 on 4/14. Will increase outpatient Hectorol dose to 4 mcg IV with dialysis; continue home Phoslo 2,001 mg bid with meals. 6.  Nutrition:  Albumin 4.0 on 4/14. Renal diet, fluid restriction.   Teofilo Pod 03/09/2021, 9:05 AM

## 2021-03-09 NOTE — Progress Notes (Signed)
[facility] Progress Note   Subjective:   Feeling good this AM.  Had dark BM this AM but didn't call RN to send for hemoccult.  Breathing back to BL and ambulated in halls.  For D/c today  Objective Vitals:   03/08/21 2337 03/09/21 0400 03/09/21 0444 03/09/21 0814  BP: (!) 125/55 (!) 141/67    Pulse: 92 85    Resp: 17 17    Temp: 98.2 F (36.8 C) 98.2 F (36.8 C)  98.7 F (37.1 C)  TempSrc: Oral Oral  Oral  SpO2: 97% 99%    Weight:   69.4 kg   Height:       Physical Exam General: eating breakfast, looks energetic Heart: RRR Lungs: clear Abdomen: soft Extremities: no edema Dialysis Access: FAL AVG +t/b  Additional Objective Labs: Basic Metabolic Panel: Recent Labs  Lab 03/07/21 2105 03/08/21 0416 03/09/21 0109  NA 139 139 136  K 4.8 4.9 4.1  CL 99 97* 95*  CO2 29 29 30   GLUCOSE 105* 102* 96  BUN 50* 53* 23  CREATININE 9.37* 9.89* 5.71*  CALCIUM 9.1 9.4 9.7   Liver Function Tests: No results for input(s): AST, ALT, ALKPHOS, BILITOT, PROT, ALBUMIN in the last 168 hours. No results for input(s): LIPASE, AMYLASE in the last 168 hours. CBC: Recent Labs  Lab 03/07/21 2105 03/08/21 0416 03/08/21 2008 03/09/21 0109  WBC 7.3 6.7 7.8 6.8  HGB 8.0* 7.6* 8.1* 7.9*  HCT 25.9* 24.5* 25.9* 25.5*  MCV 104.9* 104.3* 102.8* 104.1*  PLT 148* 148* 156 151   Blood Culture    Component Value Date/Time   SDES BLOOD RIGHT HAND 12/28/2019 1835   SPECREQUEST  12/28/2019 1835    BOTTLES DRAWN AEROBIC AND ANAEROBIC Blood Culture adequate volume Performed at [facility] [address] [place], [place] [address]    CULT NO GROWTH 5 DAYS 12/28/2019 1835   REPTSTATUS 01/02/2020 FINAL 12/28/2019 1835    Cardiac Enzymes: No results for input(s): CKTOTAL, CKMB, CKMBINDEX, TROPONINI in the last 168 hours. CBG: No results for input(s): GLUCAP in the last 168 hours. Iron Studies:  Recent Labs    03/07/21 2105  IRON 27*  TIBC 174*  FERRITIN 2,626*    @lablastinr3 @ Studies/Results: DG Chest Port 1 View  Result Date: 03/08/2021 CLINICAL DATA:  80 year old female with shortness of breath. EXAM: PORTABLE CHEST 1 VIEW COMPARISON:  Chest radiograph dated 03/06/2021 FINDINGS: There is cardiomegaly with vascular congestion, improved since the prior radiograph. There is trace left pleural effusion and left lung base atelectasis. Infiltrate is less likely. Clinical correlation is recommended. No pneumothorax. Atherosclerotic calcification of the aorta. No acute osseous pathology. IMPRESSION: Cardiomegaly with vascular congestion, improved since the prior radiograph. Electronically Signed   By: Anner Crete M.D.   On: 03/08/2021 19:33   Medications:  . atorvastatin  80 mg Oral Q breakfast  . calcium acetate  2,001 mg Oral BID WC  . carvedilol  25 mg Oral BID WC  . Chlorhexidine Gluconate Cloth  6 each Topical Q0600  . gabapentin  100 mg Oral q morning  . heparin  5,000 Units Subcutaneous Q8H  . levothyroxine  75 mcg Oral QAC breakfast  . pantoprazole (PROTONIX) IV  40 mg Intravenous Q12H  . raloxifene  60 mg Oral Q breakfast    Dialysis Orders: Center: [facility]  on TTS, 3 hr, BFR 400, DFR 500, 2K/2Ca, EDW 70 kg. Access: AV graft left forearm Hectorol 3 mcg tiw  Assessment/Plan:  1.  Acute hypoxic respiratory failure: developed acute respiratory distress with hypoxia in ED after blood transfusion.  Improved with BP control and then UF with HD yesterday. 2.  ESRD: Last HD 4/27, another today to resume schedule.  Pre/post standing weights.   3.  Hypertension/volume: Normotensive, volume improved today.  Plan trial UF 1.5L with pre/post weights - may need new EDW.  4.  Anemia: Hgb 7.6, down from 8.0 on 4/26. Macrocytic MCV 104.3. Ferritin elevated at 2,626; iron 27; Sat 16% - hold IV iron with high ferritin. Folate & B12 normal. FOBT was negative at [facility] ED, but recommend repeat test here since patient endorses recent history of dark  stools.  If unable to have done here would like [facility] to give her hemoccult tests to do.  May need outpt GI eval - discussed. 5.  Metabolic bone disease: Ca 9.4 4/26. Most recent Phos 5.2 and PTH 915 on 4/14. Will increase outpatient Hectorol dose to 4 mcg IV with dialysis; continue home Phoslo 2,001 mg bid with meals. 6.  Nutrition:  Albumin 4.0 on 4/14. Renal diet, fluid restriction.  dispo - ok to d/c after dialysis today  Jannifer Hick MD 03/09/2021, 9:15 AM  [facility] Pager: [phone]

## 2021-03-09 NOTE — Evaluation (Signed)
Physical Therapy Evaluation Patient Details Name: Latoya Lopez MRN: [id number] DOB: Mar 25, 1941 Today's Date: 03/09/2021   History of Present Illness  Pt adm with acute hypoxic respiratory failure after blood transfusion, and missed dialysis with fluid overload. Pt underwent HD. PMH - ESRD on hemodialysis, chronic diastolic CHF, hypertension, hypothyroidism, and chronic anemia.  Clinical Impression  Pt doing well with mobility and no further PT needed.  Ready for dc from PT standpoint. Amb on RA with SpO2 92%.      Follow Up Recommendations No PT follow up    Equipment Recommendations  None recommended by PT    Recommendations for Other Services       Precautions / Restrictions Precautions Precautions: Fall;Other (comment) Precaution Comments: monitor O2      Mobility  Bed Mobility               General bed mobility comments: Sitting EOB    Transfers Overall transfer level: Modified independent Equipment used: Rolling walker (2 wheeled)             General transfer comment: Slow to rise  Ambulation/Gait Ambulation/Gait assistance: Supervision Gait Distance (Feet): 170 Feet Assistive device: Rolling walker (2 wheeled) Gait Pattern/deviations: Step-through pattern;Decreased stride length Gait velocity: decr Gait velocity interpretation: 1.31 - 2.62 ft/sec, indicative of limited community ambulator General Gait Details: Steady gait with rolling walker. Supervision to monitor SpO2  Stairs            Wheelchair Mobility    Modified Rankin (Stroke Patients Only)       Balance Overall balance assessment: Needs assistance Sitting-balance support: No upper extremity supported;Feet supported Sitting balance-Leahy Scale: Normal     Standing balance support: Bilateral upper extremity supported;Single extremity supported;During functional activity Standing balance-Leahy Scale: Fair                               Pertinent  Vitals/Pain      Home Living Family/patient expects to be discharged to:: Private residence Living Arrangements: Spouse/significant other;Children Available Help at Discharge: Family;Available 24 hours/day Type of Home: Mobile home Home Access: Ramped entrance     Home Layout: One level Home Equipment: Bedside commode;Walker - 2 wheels;Cane - single point;Shower seat;Hand held shower head      Prior Function Level of Independence: Independent;Independent with assistive device(s)         Comments: Recently began using RW intermittently after a fall from bed earlier this month. Typically does not use AD. Independent with ADLs, shares IADLs with daughter/husband     Hand Dominance   Dominant Hand: Right (can use both)    Extremity/Trunk Assessment   Upper Extremity Assessment Upper Extremity Assessment: Defer to OT evaluation    Lower Extremity Assessment Lower Extremity Assessment: Generalized weakness    Cervical / Trunk Assessment Cervical / Trunk Assessment: Normal  Communication   Communication: No difficulties  Cognition Arousal/Alertness: Awake/alert Behavior During Therapy: WFL for tasks assessed/performed Overall Cognitive Status: Within Functional Limits for tasks assessed                                        General Comments General comments (skin integrity, edema, etc.): Amb on RA with SpO2 92%    Exercises     Assessment/Plan    PT Assessment Patent does not need any further PT services  PT  Problem List         PT Treatment Interventions      PT Goals (Current goals can be found in the Care Plan section)  Acute Rehab PT Goals Patient Stated Goal: go home today PT Goal Formulation: All assessment and education complete, DC therapy    Frequency     Barriers to discharge        Co-evaluation               AM-PAC PT "6 Clicks" Mobility  Outcome Measure Help needed turning from your back to your side while in a  flat bed without using bedrails?: None Help needed moving from lying on your back to sitting on the side of a flat bed without using bedrails?: None Help needed moving to and from a bed to a chair (including a wheelchair)?: None Help needed standing up from a chair using your arms (e.g., wheelchair or bedside chair)?: None Help needed to walk in hospital room?: None Help needed climbing 3-5 steps with a railing? : A Little 6 Click Score: 23    End of Session   Activity Tolerance: Patient tolerated treatment well Patient left: Other (comment) (on bsc) Nurse Communication: Other (comment) (O2 sats) PT Visit Diagnosis: History of falling (Z91.81)    Time: 0479-9872 PT Time Calculation (min) (ACUTE ONLY): 14 min   Charges:   PT Evaluation $PT Eval Low Complexity: [place] Pager [phone] Office Ward 03/09/2021, 11:05 AM

## 2021-03-09 NOTE — Discharge Summary (Signed)
LYNNEL Lopez NMM:[id number] DOB: 04-02-1941 DOA: 03/07/2021  PCP: [place] date: 03/07/2021  Discharge date: 03/09/2021  Admitted From: Home   Disposition:  Home   Recommendations for Outpatient Follow-up:   Follow up with PCP in 1-2 weeks  PCP Please obtain BMP/CBC, 2 view CXR in 1week,  (see Discharge instructions)   PCP Please follow up on the following pending results: Monitor CBC, o2 need closely   Home Health: PT, RN  Equipment/Devices: 2 lit o2 PRN  Consultations: Renal Discharge Condition: Stable    CODE STATUS: Full    Diet Recommendation: Renal with 1.5    CC -  SOB  Brief history of present illness from the day of admission and additional interim summary    Latoya Lopez a 80 y.o.femalewith medical history significant forESRD on hemodialysis, chronic diastolic CHF, hypertension, hypothyroidism, and chronic anemia who presented to the emergency department yesterday with general malaise. Patient had been feeling generally poor for several days, was initially seen by her PCP and had a urinalysis and urine culture, was given a shot of Rocephin, and later notified that urine was not suggestive of infection, due to her multiple doctor visits she missed day of dialysis left some fluid overload causing shortness of breath, she was subsequently [facility] at [facility] and transferred to [facility] for admission and dialysis.                                                                   Hospital Course    1.Acute hypoxic respiratory failure; - Developed acute respiratory distress with hypoxia in ED after blood transfusion, and missed dialysis with fluid overload, initially received Lasix and BiPAP at [facility] ER after which she was much improved, now has  undergone 1 session of dialysis on 03/08/2021 and equal another session today prior to discharge 03/10/2019 down to 1.5 L nasal cannula oxygen resting much improved, she is now symptom-free we will discharge home with as needed 2 L oxygen temporarily, I think once her fluid status is completely optimized she will come off of oxygen, no other signs of infection or worsening CHF, follow-up with PCP and nephrology within a week of discharge.  2.ESRD - renal was consulted and underwent HD on 03/08/2021 and 03/09/2021.   3.Acute on chronic anemia- AOCD, FOBT -ve, no abdominal pain no frank bleeding, received 1 unit of packed RBC in the ER at [facility] and now stable H&H, placed on twice daily PPI, no signs of ongoing bleed, outpatient follow-up with PCP for anemia work-up, will place on low-dose iron for a month.  Can also get outpatient GI work-up if appropriate for age-appropriate work-up.   4.Hypertension -Home regimen  5.Hypothyroidism-Continue Synthroidstable TSH.  Lab Results  Component Value Date  TSH 2.172 03/08/2021     Discharge diagnosis     Principal Problem:   Acute respiratory failure with hypoxia (HCC) Active Problems:   ESRD (end stage renal disease) (HCC)   HTN (hypertension), benign   Hypothyroidism   Anemia of chronic disease   Acute on chronic diastolic CHF (congestive heart failure) [facility])    Discharge instructions    Discharge Instructions    Discharge instructions   Complete by: As directed    Follow with Primary MD [place] and with your gastroenterologist in 7 to 10 days    Get CBC, CMP, 2 view Chest X ray -  checked next visit within 1 week by Primary MD  Activity: As tolerated with Full fall precautions use walker/cane & assistance as needed  Disposition Home  Diet: Renal diet with 1.5 L/day total fluid restriction  Special Instructions: If you have smoked or chewed Tobacco  in the last 2 yrs please stop smoking, stop  any regular Alcohol  and or any Recreational drug use.  On your next visit with your primary care physician please Get Medicines reviewed and adjusted.  Please request your Prim.MD to go over all Hospital Tests and Procedure/Radiological results at the follow up, please get all Hospital records sent to your Prim MD by signing hospital release before you go home.  If you experience worsening of your admission symptoms, develop shortness of breath, life threatening emergency, suicidal or homicidal thoughts you must seek medical attention immediately by calling 911 or calling your MD immediately  if symptoms less severe.  You Must read complete instructions/literature along with all the possible adverse reactions/side effects for all the Medicines you take and that have been prescribed to you. Take any new Medicines after you have completely understood and accpet all the possible adverse reactions/side effects.   Increase activity slowly   Complete by: As directed       Discharge Medications   Allergies as of 03/09/2021   No Known Allergies     Medication List    STOP taking these medications   omeprazole 20 MG capsule Commonly known as: PRILOSEC     TAKE these medications   acetaminophen 500 MG tablet Commonly known as: TYLENOL Take 500 mg by mouth every 6 (six) hours as needed for mild pain or headache.   amLODipine 10 MG tablet Commonly known as: NORVASC Take 10 mg by mouth at bedtime.   atorvastatin 80 MG tablet Commonly known as: LIPITOR Take 80 mg by mouth daily with breakfast.   calcium acetate 667 MG capsule Commonly known as: PHOSLO Take 2 capsules (1,334 mg total) by mouth 3 (three) times daily with meals. What changed:   how much to take  when to take this   carvedilol 12.5 MG tablet Commonly known as: COREG Take 12.5-25 mg by mouth See admin instructions. Take 1 tablet (12.5mg ) by mouth in the evening ONLY on Tuesdays, Thursdays, & Saturdays. Take 1 tablet  (12.5 mg) by mouth twice daily on Sundays, Mondays, Wednesdays, & Fridays.   ferrous sulfate 325 (65 FE) MG tablet Take 1 tablet (325 mg total) by mouth 2 (two) times daily with a meal.   gabapentin 100 MG capsule Commonly known as: NEURONTIN Take 100 mg by mouth daily.   levothyroxine 75 MCG tablet Commonly known as: SYNTHROID Take 75 mcg by mouth daily before breakfast.   lidocaine-prilocaine cream Commonly known as: EMLA Apply 1 application topically See admin instructions. Apply small amount to  access site 1 to 2 hours prior to dialysis   loratadine 10 MG tablet Commonly known as: CLARITIN Take 10 mg by mouth daily.   multivitamin with minerals Tabs tablet Take 1 tablet by mouth daily.   pantoprazole 40 MG tablet Commonly known as: Protonix Take 1 tablet (40 mg total) by mouth 2 (two) times daily.   raloxifene 60 MG tablet Commonly known as: EVISTA Take 60 mg by mouth daily with breakfast.            Durable Medical Equipment  (From admission, onward)         Start     Ordered   03/09/21 0942  For home use only DME oxygen  Once       Question Answer Comment  Length of Need 6 Months   Mode or (Route) Nasal cannula   Liters per Minute 2   Frequency Continuous (stationary and portable oxygen unit needed)   Oxygen conserving device Yes   Oxygen delivery system Gas      03/09/21 0941           Follow-up Information    [facility] [place]. Schedule an appointment as soon as possible for a visit in 1 week(s).   Why: And with your gastroenterologist within 7 to 10 days for anemia work-up Contact information: [place] [address] [phone]               Major procedures and Radiology Reports - PLEASE review detailed and final reports thoroughly  -        DG Chest [facility] 1 View  Result Date: 03/08/2021 CLINICAL DATA:  80 year old female with shortness of breath. EXAM: PORTABLE CHEST 1 VIEW COMPARISON:   Chest radiograph dated 03/06/2021 FINDINGS: There is cardiomegaly with vascular congestion, improved since the prior radiograph. There is trace left pleural effusion and left lung base atelectasis. Infiltrate is less likely. Clinical correlation is recommended. No pneumothorax. Atherosclerotic calcification of the aorta. No acute osseous pathology. IMPRESSION: Cardiomegaly with vascular congestion, improved since the prior radiograph. Electronically Signed   By: Anner Crete M.D.   On: 03/08/2021 19:33    Micro Results     Recent Results (from the past 240 hour(s))  MRSA PCR Screening     Status: None   Collection Time: 03/08/21  2:48 AM   Specimen: Nasal Mucosa; Nasopharyngeal  Result Value Ref Range Status   MRSA by PCR NEGATIVE NEGATIVE Final    Comment:        The GeneXpert MRSA Assay (FDA approved for NASAL specimens only), is one component of a comprehensive MRSA colonization surveillance program. It is not intended to diagnose MRSA infection nor to guide or monitor treatment for MRSA infections. Performed at [facility], [place] [address], [place], [place] [address]   SARS CORONAVIRUS 2 (TAT 6-24 HRS) Nasopharyngeal Nasopharyngeal Swab     Status: None   Collection Time: 03/08/21 11:47 AM   Specimen: Nasopharyngeal Swab  Result Value Ref Range Status   SARS Coronavirus 2 NEGATIVE NEGATIVE Final    Comment: (NOTE) SARS-CoV-2 target nucleic acids are NOT DETECTED.  The SARS-CoV-2 RNA is generally detectable in upper and lower respiratory specimens during the acute phase of infection. Negative results do not preclude SARS-CoV-2 infection, do not rule out co-infections with other pathogens, and should not be used as the sole basis for treatment or other patient management decisions. Negative results must be combined with clinical observations,  patient history, and epidemiological information. The expected result is Negative.  Fact Sheet for  Patients: SugarRoll.be  Fact Sheet for Healthcare Providers: https://www.woods-mathews.com/  This test is not yet approved or cleared by the [place] FDA and  has been authorized for detection and/or diagnosis of SARS-CoV-2 by FDA under an Emergency Use Authorization (EUA). This EUA will remain  in effect (meaning this test can be used) for the duration of the COVID-19 declaration under Se ction 564(b)(1) of the Act, 21 U.S.C. section 360bbb-3(b)(1), unless the authorization is terminated or revoked sooner.  Performed at [facility], [place] [address] [place], [place] [address]     Today   Subjective    Jory Welke today has no headache,no chest abdominal pain,no new weakness tingling or numbness, feels much better wants to go home today.     Objective   Blood pressure (!) 141/67, pulse 85, temperature 98.7 F (37.1 C), temperature source Oral, resp. rate 17, height 5\' 9"  (1.753 m), weight 69.4 kg, SpO2 99 %.   Intake/Output Summary (Last 24 hours) at 03/09/2021 0945 Last data filed at 03/08/2021 1530 Gross per 24 hour  Intake --  Output 3500 ml  Net -3500 ml    Exam  Awake Alert, No new F.N deficits, Normal affect [place].AT,PERRAL Supple Neck,No JVD, No cervical lymphadenopathy appriciated.  Symmetrical Chest wall movement, Good air movement bilaterally, few rales RRR,No Gallops,Rubs or new Murmurs, No Parasternal Heave +ve B.Sounds, Abd Soft, Non tender, No organomegaly appriciated, No rebound -guarding or rigidity. No Cyanosis, Clubbing or edema, No new Rash or bruise   Data Review   CBC w Diff:  Lab Results  Component Value Date   WBC 6.8 03/09/2021   HGB 7.9 (L) 03/09/2021   HCT 25.5 (L) 03/09/2021   PLT 151 03/09/2021   LYMPHOPCT 25 12/30/2019   MONOPCT 15 12/30/2019   EOSPCT 6 12/30/2019   BASOPCT 1 12/30/2019    CMP:  Lab Results  Component Value Date   NA 136 03/09/2021   K 4.1 03/09/2021    CL 95 (L) 03/09/2021   CO2 30 03/09/2021   BUN 23 03/09/2021   CREATININE 5.71 (H) 03/09/2021   PROT 5.9 (L) 08/17/2017   ALBUMIN 2.8 (L) 12/29/2019   BILITOT 0.6 08/17/2017   ALKPHOS 51 08/17/2017   AST 24 08/17/2017   ALT 25 08/17/2017  .   Total Time in preparing paper work, data evaluation and todays exam - 4 minutes  Lala Lund M.D on 03/09/2021 at 9:45 AM  [facility]

## 2021-03-09 NOTE — Plan of Care (Signed)

## 2021-03-09 NOTE — TOC Initial Note (Signed)
Transition of Care [facility]) - Initial/Assessment Note    Patient Details  Name: Latoya Lopez MRN: [id number] Date of Birth: 09-03-1941  [facility] CM/SW Contact:    Verdell Carmine, RN Phone Number: 03/09/2021, 8:39 AM  Clinical Narrative:                 80 Yo patient admitted with Pulmonary edema. Has ESRD on dialysis. Lives with her daughter and husband. OT recommendation no needs at this time. CM will follow for any needs  Expected Discharge Plan: Home/Self Care Barriers to Discharge: Continued Medical Work up   Patient Goals and CMS Choice        Expected Discharge Plan and Services Expected Discharge Plan: Home/Self Care   Discharge Planning Services: CM Consult   Living arrangements for the past 2 months: Single Family Home Expected Discharge Date: 03/09/21                                    Prior Living Arrangements/Services Living arrangements for the past 2 months: Single Family Home Lives with:: Adult Children,Spouse Patient language and need for interpreter reviewed:: Yes        Need for Family Participation in Patient Care: Yes (Comment) Care giver support system in place?: Yes (comment)   Criminal Activity/Legal Involvement Pertinent to Current Situation/Hospitalization: No - Comment as needed  Activities of Daily Living Home Assistive Devices/Equipment: Walker (specify type) ADL Screening (condition at time of admission) Patient's cognitive ability adequate to safely complete daily activities?: Yes Is the patient deaf or have difficulty hearing?: No Does the patient have difficulty seeing, even when wearing glasses/contacts?: No Does the patient have difficulty concentrating, remembering, or making decisions?: No Patient able to express need for assistance with ADLs?: Yes Does the patient have difficulty dressing or bathing?: No Independently performs ADLs?: Yes (appropriate for developmental age) Does the patient have  difficulty walking or climbing stairs?: No Weakness of Legs: None Weakness of Arms/Hands: None  Permission Sought/Granted                  Emotional Assessment       Orientation: : Oriented to Self,Oriented to Place Alcohol / Substance Use: Not Applicable Psych Involvement: No (comment)  Admission diagnosis:  Acute respiratory failure with hypoxia ([place]) [J96.01] Patient Active Problem List   Diagnosis Date Noted  . Acute on chronic diastolic CHF (congestive heart failure) ([place]) 03/07/2021  . Sepsis without acute organ dysfunction ([place])   . Fever 12/28/2019  . Anemia of chronic disease 12/28/2019  . Hyperlipidemia 12/28/2019  . Acute respiratory failure with hypoxia ([place]) 12/28/2019  . 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 ESRD (end stage renal disease) ([place]) 08/17/2017  . HTN (hypertension), benign 08/17/2017  . Hypothyroidism 08/17/2017  . Acute congestive heart failure ([place])   . Pleural effusion    PCP:  [place]:   [id number] - [place], [place] [address] [place] [address] Phone: [phone] Fax: [facility] - [place], [place] [address] Phone: [phone] Fax: [phone]     Social Determinants of Health (SDOH) Interventions    Readmission Risk Interventions Readmission Risk Prevention Plan 12/29/2019  Transportation Screening Complete  PCP or Specialist Appt within  5-7 Days Not Complete  Not Complete comments pending medical stability  Home Care Screening Complete  Medication Review (RN CM) Referral to Pharmacy  Some recent data might be hidden

## 2021-03-09 NOTE — Discharge Instructions (Signed)
Follow with Primary MD [place] and with your gastroenterologist in 7 to 10 days    Get CBC, CMP, 2 view Chest X ray -  checked next visit within 1 week by Primary MD  Activity: As tolerated with Full fall precautions use walker/cane & assistance as needed  Disposition Home  Diet: Renal diet with 1.5 L/day total fluid restriction  Special Instructions: If you have smoked or chewed Tobacco  in the last 2 yrs please stop smoking, stop any regular Alcohol  and or any Recreational drug use.  On your next visit with your primary care physician please Get Medicines reviewed and adjusted.  Please request your Prim.MD to go over all Hospital Tests and Procedure/Radiological results at the follow up, please get all Hospital records sent to your Prim MD by signing hospital release before you go home.  If you experience worsening of your admission symptoms, develop shortness of breath, life threatening emergency, suicidal or homicidal thoughts you must seek medical attention immediately by calling 911 or calling your MD immediately  if symptoms less severe.  You Must read complete instructions/literature along with all the possible adverse reactions/side effects for all the Medicines you take and that have been prescribed to you. Take any new Medicines after you have completely understood and accpet all the possible adverse reactions/side effects.

## 2021-03-10 ENCOUNTER — Telehealth: Payer: Self-pay | Admitting: Physician Assistant

## 2021-03-10 LAB — HAPTOGLOBIN: Haptoglobin: 266 mg/dL (ref 42–346)

## 2021-03-10 LAB — HEPATITIS B SURFACE ANTIBODY, QUANTITATIVE: Hep B S AB Quant (Post): 68.7 m[IU]/mL (ref >9.9)

## 2021-03-10 LAB — BASIC METABOLIC PANEL
Anion gap: 11 (ref 5–15)
BUN: 23 mg/dL (ref 8–23)
CO2: 30 mmol/L (ref 22–32)
Calcium: 9.7 mg/dL (ref 8.9–10.3)
Chloride: 95 mmol/L — ABNORMAL LOW (ref 98–111)
Creatinine, Ser: 5.71 mg/dL — ABNORMAL HIGH (ref 0.44–1.00)
GFR, Estimated: 7 mL/min — ABNORMAL LOW (ref >60)
Glucose, Bld: 96 mg/dL (ref 70–99)
Potassium: 4.1 mmol/L (ref 3.5–5.1)
Sodium: 136 mmol/L (ref 135–145)

## 2021-03-10 NOTE — Telephone Encounter (Signed)
Transition of care contact from inpatient facility  Date of Discharge: 03/09/21 Date of Contact: 03/10/21 Method of contact: Phone  Attempted to contact patient to discuss transition of care from inpatient admission. Patient did not answer the phone and unable to leave message. Will continue to attempt to contact patient at home/at HD center.  Anice Paganini, PA-C 03/10/2021, 1:29 PM  [employer]

## 2022-02-03 DIAGNOSIS — N186 End stage renal disease: Secondary | ICD-10-CM | POA: Insufficient documentation

## 2022-02-12 DIAGNOSIS — I350 Nonrheumatic aortic (valve) stenosis: Secondary | ICD-10-CM | POA: Insufficient documentation

## 2022-03-30 DIAGNOSIS — I2510 Atherosclerotic heart disease of native coronary artery without angina pectoris: Secondary | ICD-10-CM | POA: Insufficient documentation

## 2022-03-30 DIAGNOSIS — Z87891 Personal history of nicotine dependence: Secondary | ICD-10-CM | POA: Insufficient documentation

## 2023-01-25 DIAGNOSIS — Z87891 Personal history of nicotine dependence: Secondary | ICD-10-CM | POA: Insufficient documentation

## 2023-06-05 ENCOUNTER — Ambulatory Visit (INDEPENDENT_AMBULATORY_CARE_PROVIDER_SITE_OTHER): Payer: Medicare Other | Admitting: Podiatry

## 2023-06-05 DIAGNOSIS — M2042 Other hammer toe(s) (acquired), left foot: Secondary | ICD-10-CM | POA: Diagnosis not present

## 2023-06-05 DIAGNOSIS — L97512 Non-pressure chronic ulcer of other part of right foot with fat layer exposed: Secondary | ICD-10-CM

## 2023-06-05 DIAGNOSIS — I739 Peripheral vascular disease, unspecified: Secondary | ICD-10-CM

## 2023-06-05 DIAGNOSIS — L97521 Non-pressure chronic ulcer of other part of left foot limited to breakdown of skin: Secondary | ICD-10-CM

## 2023-06-05 DIAGNOSIS — M2041 Other hammer toe(s) (acquired), right foot: Secondary | ICD-10-CM | POA: Diagnosis not present

## 2023-06-05 MED ORDER — MEDIHONEY WOUND/BURN DRESSING EX PSTE: 1.0000 | PASTE | Freq: Every day | CUTANEOUS | 2 refills | Status: DC

## 2023-06-05 MED ORDER — LIDOCAINE-PRILOCAINE 2.5-2.5 % EX CREA: 1.0000 | TOPICAL_CREAM | CUTANEOUS | 0 refills | Status: DC | PRN

## 2023-06-05 NOTE — Progress Notes (Signed)
Chief Complaint  Patient presents with  . Callouses    Pt has a callus on her left 3rd toe and a sore on her right 3rd toe she states she soaks her feet with epsom salt and water and the things her husband might have put to much epsom salt   HPI: 82 y.o. female presenting today with a family member for concern of painful wounds on the dorsal aspect of toes bilateral.  Denies drainage from the left but there is some drainage on her right.  States that they are both painful but the one on the dorsal right second toe is exquisitely tender.  States that this was not caused by injury.  She does not feel that her shoes have been rubbing the area.  As she is on dialysis and her dates for dialysis are Tuesdays, Thursdays, and Saturdays.  Past Medical History:  Diagnosis Date  . Anemia of renal disease   . Arthritis    osteoarthritis  . Cancer (HCC)    skin- basal- 7 times and location  . Dyspnea    when climbing stairs  . ESRD (end stage renal disease) (HCC)    hemodialysis MWF at [facility]  . GERD (gastroesophageal reflux disease)   . Heart murmur    "Nothing to be concerned about, the heart Dr said."  . Hyperkalemia   . Hyperlipidemia   . Hyperphosphatemia   . Hypertension    Essential with goal blood pressure less than 130/80, 12/22/2019-   . Hypokalemia   . Hypoparathyroidism (HCC)   . Hypothyroidism   . Membranous glomerulonephritis   . Proteinuria   . S/P partial hysterectomy   . Seasonal allergies   . Secondary hyperparathyroidism, renal (HCC)   . Wears glasses     Past Surgical History:  Procedure Laterality Date  . A/V FISTULAGRAM N/A 02/10/2020   Procedure: A/V FISTULAGRAM - Left Arm;  Surgeon: Cephus Shelling, MD;  Location: [facility] INVASIVE CV LAB;  Service: Cardiovascular;  Laterality: N/A;  . ABDOMINAL HYSTERECTOMY     PARTIAL  . AV FISTULA PLACEMENT Right 08/06/2017   Procedure: ARTERIOVENOUS (AV) FISTULA CREATION RIGHT ARM;  Surgeon: Sherren Kerns, MD;  Location: [facility] OR;  Service: Vascular;  Laterality: Right;  . AV FISTULA PLACEMENT Right 08/20/2017   Procedure: INSERTION OF ARTERIOVENOUS (AV) GORE-TEX STRETCH GRAFT INTO RIGHT ARM;  Surgeon: Sherren Kerns, MD;  Location: [facility] OR;  Service: Vascular;  Laterality: Right;  . AV FISTULA PLACEMENT Left 05/29/2019   Procedure: ARTERIOVENOUS (AV) FISTULA CREATION LEFT ARM;  Surgeon: Nada Libman, MD;  Location: [facility] OR;  Service: Vascular;  Laterality: Left;  . AV FISTULA PLACEMENT Left 12/23/2019   Procedure: INSERTION OF LEFT ARM ARTERIOVENOUS (AV) artegraft;  Surgeon: Nada Libman, MD;  Location: [facility] OR;  Service: Vascular;  Laterality: Left;  . BASCILIC VEIN TRANSPOSITION Left 07/22/2019   Procedure: SECOND STAGE BASILIC VEIN TRANSPOSITION LEFT ARM;  Surgeon: Nada Libman, MD;  Location: [facility] OR;  Service: Vascular;  Laterality: Left;  . COLONOSCOPY    . INSERTION OF DIALYSIS CATHETER N/A 08/17/2017   Procedure: INSERTION OF DIALYSIS CATHETER;  Surgeon: Larina Earthly, MD;  Location: [facility] OR;  Service: Vascular;  Laterality: N/A;  . INSERTION OF DIALYSIS CATHETER Right 09/21/2018   Procedure: INSERTION OF DIALYSIS CATHETER;  Surgeon: Cephus Shelling, MD;  Location: [facility] OR;  Service: Vascular;  Laterality: Right;  . REVISION OF ARTERIOVENOUS GORETEX GRAFT Right 07/07/2018  Procedure: REVISION OF ARTERIOVENOUS GORETEX GRAFT;  Surgeon: Sherren Kerns, MD;  Location: [facility] OR;  Service: Vascular;  Laterality: Right;  . SKIN BIOPSY      SKIN CANCER 6 AREAS  . THROMBECTOMY AND REVISION OF ARTERIOVENTOUS (AV) GORETEX  GRAFT Right 09/17/2018   Procedure: THROMBECTOMY AND REVISION OF ARTERIOVENTOUS (AV) GORETEX  GRAFT;  Surgeon: Nada Libman, MD;  Location: [facility] OR;  Service: Vascular;  Laterality: Right;  . TUBUALIGATION      No Known Allergies  Smoker?: former smoker, quit 27 years ago  Review of Systems  Skin:        Ulcer dorsal aspect of toes    PHYSICAL EXAM: There were no  vitals filed for this visit.  General: The patient is alert and oriented x3 in no acute distress.  Dermatology: Skin has a tight leathery texture to the feet and toes.  Skin is taut.  There is diffuse hyperpigmentation in the lower legs and feet.    Wound 1 description:  Location:  dorsal right 2nd toe    Depth: To subcutaneous tissue    Wound Border: No hyperkeratosis.  Minimal maceration    Odor?:  None    Surrounding Tissue: Wound base has fibrous tissue with some granular tissue noted.  No edema or erythema    Infected?:  No    Necrosis?:  No gangrenous changes    Pain?:  Yes, significant    Tunneling: No    Dimensions (cm): 1.1 cm x 0.4 cm x 0.2 cm  Wound 2 description:  Location: Dorsal left third toe    Depth: Partial-thickness/superficial    Wound Border: Hyperkeratotic/eschar    Odor?:  No    Surrounding Tissue: No edema or erythema    Infected?:  No    Necrosis?:  No    Pain?:  Yes, mild    Tunneling: None    Dimensions (cm): 0.2 cm x 0.3 cm x 0.1 cm  Vascular: Pedal pulses are absent bilateral.  Capillary refill is prolonged to the toes bilateral.  Toes are slightly cool but not cold.  Neurological: Light touch sensation grossly intact bilateral feet.   Musculoskeletal Exam: There are contractures of the PIP joints of the lesser toes which are semirigid in nature.  ASSESSMENT / PLAN OF CARE: 1. PVD (peripheral vascular disease) (HCC)   2. Skin ulcer of toe of right foot with fat layer exposed (HCC)   3. Skin ulcer of toe of left foot, limited to breakdown of skin (HCC)      Meds ordered this encounter  Medications  . lidocaine-prilocaine (EMLA) cream    Sig: Apply 1 Application topically as needed.    Dispense:  30 g    Refill:  0  . leptospermum manuka honey (MEDIHONEY) PSTE paste    Sig: Apply 1 Application topically daily. Apply to wounds and cover with gauze once daily    Dispense:  30 mL    Refill:  2   US ARTERIAL SEG MULTIPLE LE (ABI, SEGMENTAL  PRESSURES, PVR'S) VAS [place] ABI WITH/WO TBI  The ulcerations were sharply debrided of hyperkeratotic and devitalized soft tissue with sterile #312 blade to the level of subcutaneous tissue on the right toe and to the level of dermis on the left toe .  Hemostasis obtained.  Antibiotic ointment and DSD applied.  Reviewed off-loading with patient.  Prescription for Emla cream sent in to help manage her discomfort.  She can apply this at dressing changes and let  this sit for 10 minutes before redressing the toes.  Prescription for Medihoney was sent to her pharmacy since there is no coverage for the collagenase Santyl ointment.  She will apply Medihoney to the wound with a gauze dressing once daily.  Informed the patient that I am very concerned regarding arterial supply to the lower leg and foot due to the skin changes as well as the ulceration, slow healing, and significant pain to the wounds.  Will get her set up for ABIs, arterial ultrasound, segmental pressures and PVRs.  It does not appear that any [facility] location will perform all of these tests so we will need to send her to a [facility] location  Reviewed daily dressing changes with patient.  Discussed risks / concerns regarding ulcer with patient and possible sequelae if left untreated.  Stressed importance of infection prevention at home. Short-term goals are: prevent infection, off-load ulcer, heal ulcer Long-term goals are:  prevent recurrence, prevent amputation.   Follow-up 2 weeks  Cheney Gosch DBurna Mortimer, DPM, FACFAS [facility]     [address] [place], [place] [address]                Office [phone]  Fax [phone]

## 2023-06-19 ENCOUNTER — Ambulatory Visit: Payer: Medicare Other | Admitting: Podiatry

## 2023-06-19 DIAGNOSIS — L97512 Non-pressure chronic ulcer of other part of right foot with fat layer exposed: Secondary | ICD-10-CM | POA: Diagnosis not present

## 2023-06-19 DIAGNOSIS — I739 Peripheral vascular disease, unspecified: Secondary | ICD-10-CM | POA: Diagnosis not present

## 2023-06-19 NOTE — Progress Notes (Signed)
Chief Complaint  Patient presents with   Wound Check    Skin ulcer of right 2nd toe and 4th left toe. Patient is applying medihoney to area.    HPI: 82 y.o. female presenting today for follow-up of dorsal right second toe ulceration and dorsal left third toe ulceration.  She notes that she is not having any pain to the left third toe wound.  She notes that the right second toe wound is still extremely tender.  The Emla cream did not provide any relief.  As she does not feel that the right second toe wound has improved at all.  She is scheduled for vascular studies on 07/03/2023.  She is currently using Medihoney and a gauze dressing to the toes.  This patient is on dialysis.  Past Medical History:  Diagnosis Date   Anemia of renal disease    Arthritis    osteoarthritis   Cancer (HCC)    skin- basal- 7 times and location   Dyspnea    when climbing stairs   ESRD (end stage renal disease) (HCC)    hemodialysis MWF at [facility]   GERD (gastroesophageal reflux disease)    Heart murmur    "Nothing to be concerned about, the heart Dr said."   Hyperkalemia    Hyperlipidemia    Hyperphosphatemia    Hypertension    Essential with goal blood pressure less than 130/80, 12/22/2019-    Hypokalemia    Hypoparathyroidism (HCC)    Hypothyroidism    Membranous glomerulonephritis    Proteinuria    S/P partial hysterectomy    Seasonal allergies    Secondary hyperparathyroidism, renal (HCC)    Wears glasses     Past Surgical History:  Procedure Laterality Date   A/V FISTULAGRAM N/A 02/10/2020   Procedure: A/V FISTULAGRAM - Left Arm;  Surgeon: Cephus Shelling, MD;  Location: [facility] INVASIVE CV LAB;  Service: Cardiovascular;  Laterality: N/A;   ABDOMINAL HYSTERECTOMY     PARTIAL   AV FISTULA PLACEMENT Right 08/06/2017   Procedure: ARTERIOVENOUS (AV) FISTULA CREATION RIGHT ARM;  Surgeon: Sherren Kerns, MD;  Location: [facility] OR;  Service: Vascular;  Laterality: Right;   AV FISTULA  PLACEMENT Right 08/20/2017   Procedure: INSERTION OF ARTERIOVENOUS (AV) GORE-TEX STRETCH GRAFT INTO RIGHT ARM;  Surgeon: Sherren Kerns, MD;  Location: [facility] OR;  Service: Vascular;  Laterality: Right;   AV FISTULA PLACEMENT Left 05/29/2019   Procedure: ARTERIOVENOUS (AV) FISTULA CREATION LEFT ARM;  Surgeon: Nada Libman, MD;  Location: [facility] OR;  Service: Vascular;  Laterality: Left;   AV FISTULA PLACEMENT Left 12/23/2019   Procedure: INSERTION OF LEFT ARM ARTERIOVENOUS (AV) artegraft;  Surgeon: Nada Libman, MD;  Location: [facility] OR;  Service: Vascular;  Laterality: Left;   BASCILIC VEIN TRANSPOSITION Left 07/22/2019   Procedure: SECOND STAGE BASILIC VEIN TRANSPOSITION LEFT ARM;  Surgeon: Nada Libman, MD;  Location: [facility] OR;  Service: Vascular;  Laterality: Left;   COLONOSCOPY     INSERTION OF DIALYSIS CATHETER N/A 08/17/2017   Procedure: INSERTION OF DIALYSIS CATHETER;  Surgeon: Larina Earthly, MD;  Location: [facility] OR;  Service: Vascular;  Laterality: N/A;   INSERTION OF DIALYSIS CATHETER Right 09/21/2018   Procedure: INSERTION OF DIALYSIS CATHETER;  Surgeon: Cephus Shelling, MD;  Location: [facility] OR;  Service: Vascular;  Laterality: Right;   REVISION OF ARTERIOVENOUS GORETEX GRAFT Right 07/07/2018   Procedure: REVISION OF ARTERIOVENOUS GORETEX GRAFT;  Surgeon: Sherren Kerns, MD;  Location: [facility] OR;  Service: Vascular;  Laterality: Right;   SKIN BIOPSY      SKIN CANCER 6 AREAS   THROMBECTOMY AND REVISION OF ARTERIOVENTOUS (AV) GORETEX  GRAFT Right 09/17/2018   Procedure: THROMBECTOMY AND REVISION OF ARTERIOVENTOUS (AV) GORETEX  GRAFT;  Surgeon: Nada Libman, MD;  Location: [facility] OR;  Service: Vascular;  Laterality: Right;   TUBUALIGATION     No Known Allergies   PHYSICAL EXAM: There were no vitals filed for this visit.  General: The patient is alert and oriented x3 in no acute distress.  Dermatology: Forefoot is cool bilaterally.  Skin has a leathery texture to the dorsal foot and toes.   There is dependent rubor / hyperpigmentation noted to the foot bilateral    Wound 1 description:  Location: Dorsal right second toe    Depth: To subcutaneous tissue    Wound Border: Minimal to no hyperkeratosis.  Wound has fibrous base no gangrenous changes    Odor?:  None    Surrounding Tissue: No erythema or edema    Infected?:  No    Necrosis?:  No    Pain?:  Yes, significant    Tunneling: No    Dimensions (cm): 1.1 cm x 0.4 cm x 0.2 cm  Wound 2 description:  Location: Dorsal left third toe PIPJ    Depth: Closed    Wound Border: Minimal loose eschar    Odor?:  No    Surrounding Tissue: No erythema or edema    Infected?:  No    Necrosis?:  No    Pain?:  No    Tunneling: None    Dimensions (cm): Not applicable  Vascular: Pedal pulses are absent.  Neurological: Light touch sensation grossly intact bilateral feet.    ASSESSMENT / PLAN OF CARE: 1. PVD (peripheral vascular disease) (HCC)   2. Skin ulcer of toe of right foot with fat layer exposed (HCC)      Meds ordered this encounter  Medications   pentoxifylline (TRENTAL) 400 MG CR tablet    Sig: Take 1 tablet (400 mg total) by mouth daily. On your dialysis days, take your tablet after dialysis.    Dispense:  30 tablet    Refill:  2   The ulceration on the right second toe was sharply debrided of hyperkeratotic and devitalized soft tissue with sterile #312 blade to the level of subcutaneous tissue.  This was painful for the patient..  Hemostasis obtained.  Antibiotic ointment and DSD applied.  Reviewed off-loading with patient.  Reviewed daily dressing changes with patient.  Since the prescription Emla cream has not been providing any improvement of pain for the patient and she has not had a vascular studies yet, will start the patient on prescription for Pentoxyfylline 400 mg once daily.  On her dialysis days, she will take this after dialysis treatment.  Will see if this can help improve some blood flow to the toes while  awaiting her vascular studies  Discussed risks / concerns regarding ulcer with patient and possible sequelae if left untreated.  Stressed importance of infection prevention at home. Short-term goals are: Prevent infection, off-load ulcer, heal ulcer Long-term goals are:  prevent recurrence, prevent amputation.   Return for 8/23 or later for ulcer check and review vascular results.   Clerance Lav, DPM, FACFAS [facility]     [address]  [place], [place] [address]                Office [phone]  Fax [phone]

## 2023-06-22 MED ORDER — PENTOXIFYLLINE ER 400 MG PO TBCR
400.0000 mg | EXTENDED_RELEASE_TABLET | Freq: Every day | ORAL | 2 refills | Status: DC
Start: 2023-06-22 — End: 2023-09-13

## 2023-07-03 ENCOUNTER — Ambulatory Visit: Payer: Medicare Other | Attending: Podiatry

## 2023-07-03 ENCOUNTER — Ambulatory Visit (INDEPENDENT_AMBULATORY_CARE_PROVIDER_SITE_OTHER): Payer: Medicare Other

## 2023-07-03 DIAGNOSIS — L97521 Non-pressure chronic ulcer of other part of left foot limited to breakdown of skin: Secondary | ICD-10-CM

## 2023-07-03 DIAGNOSIS — I739 Peripheral vascular disease, unspecified: Secondary | ICD-10-CM

## 2023-07-03 DIAGNOSIS — L97512 Non-pressure chronic ulcer of other part of right foot with fat layer exposed: Secondary | ICD-10-CM | POA: Diagnosis present

## 2023-07-04 ENCOUNTER — Telehealth: Payer: Self-pay | Admitting: Podiatry

## 2023-07-04 DIAGNOSIS — I739 Peripheral vascular disease, unspecified: Secondary | ICD-10-CM

## 2023-07-04 DIAGNOSIS — L97521 Non-pressure chronic ulcer of other part of left foot limited to breakdown of skin: Secondary | ICD-10-CM

## 2023-07-04 DIAGNOSIS — L97512 Non-pressure chronic ulcer of other part of right foot with fat layer exposed: Secondary | ICD-10-CM

## 2023-07-04 NOTE — Telephone Encounter (Signed)
Called patient to review vascular studies.  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  Due to her poor healing wound and significant toe pain, I feel she should be seen by the vascular surgeon.  I'm going to put in an order for a consult with the surgeon to see what our options are for her at this time.  If she calls back, this was what it was for.

## 2023-07-10 ENCOUNTER — Ambulatory Visit (INDEPENDENT_AMBULATORY_CARE_PROVIDER_SITE_OTHER): Payer: Medicare Other | Admitting: Podiatry

## 2023-07-10 DIAGNOSIS — L97511 Non-pressure chronic ulcer of other part of right foot limited to breakdown of skin: Secondary | ICD-10-CM

## 2023-07-10 DIAGNOSIS — I739 Peripheral vascular disease, unspecified: Secondary | ICD-10-CM | POA: Diagnosis not present

## 2023-07-10 NOTE — Progress Notes (Signed)
Chief Complaint  Patient presents with   Wound Check    Skin ulcer of toe to right foot. Patient continues to take the prescribed medication and she is applying medihoney to the area. Patient stated she had the circulation test done here in [place] last Wednesday and has another appointment in October in [place].    HPI: 82 y.o. female presenting today for follow-up of ulceration to the toes.  Since last visit, I did call the patient to review some of the findings.  We initiated pentoxifylline medication to see if this may provide some improvement in flow to the toes to facilitate healing.  As she does note she is not having as much pain in the toes at bedtime.  She notes there is still some pain to the toe during the day but overall she thinks there has been slight improvement.  We did place a consult with the vascular surgeon but her appointment is not until early October.  Trying to see if the patient is a candidate for any type of treatment to improve blood flow permanently to the toes and prevent recurrence due to her significant ulcer pain in the toes which has not improved with any topical medications.  Her insurance would not approve topical Emla cream.  The wound on the left toe has resolved.  Past Medical History:  Diagnosis Date   Anemia of renal disease    Arthritis    osteoarthritis   Cancer (HCC)    skin- basal- 7 times and location   Dyspnea    when climbing stairs   ESRD (end stage renal disease) (HCC)    hemodialysis MWF at [facility]   GERD (gastroesophageal reflux disease)    Heart murmur    "Nothing to be concerned about, the heart Dr said."   Hyperkalemia    Hyperlipidemia    Hyperphosphatemia    Hypertension    Essential with goal blood pressure less than 130/80, 12/22/2019-    Hypokalemia    Hypoparathyroidism (HCC)    Hypothyroidism    Membranous glomerulonephritis    Proteinuria    S/P partial hysterectomy    Seasonal allergies     Secondary hyperparathyroidism, renal (HCC)    Wears glasses     Past Surgical History:  Procedure Laterality Date   A/V FISTULAGRAM N/A 02/10/2020   Procedure: A/V FISTULAGRAM - Left Arm;  Surgeon: Cephus Shelling, MD;  Location: [facility] IN[facility];  Service: Cardiovascular;  Laterality: N/A;   ABDOMINAL HYSTERECTOMY     PARTIAL   AV FISTULA PLACEMENT Right 08/06/2017   Procedure: ARTERIOVENOUS (AV) FISTULA CREATION RIGHT ARM;  Surgeon: Sherren Kerns, MD;  Location: [facility] OR;  Service: Vascular;  Laterality: Right;   AV FISTULA PLACEMENT Right 08/20/2017   Procedure: INSERTION OF ARTERIOVENOUS (AV) GORE-TEX STRETCH GRAFT INTO RIGHT ARM;  Surgeon: Sherren Kerns, MD;  Location: [facility] OR;  Service: Vascular;  Laterality: Right;   AV FISTULA PLACEMENT Left 05/29/2019   Procedure: ARTERIOVENOUS (AV) FISTULA CREATION LEFT ARM;  Surgeon: Nada Libman, MD;  Location: [facility] OR;  Service: Vascular;  Laterality: Left;   AV FISTULA PLACEMENT Left 12/23/2019   Procedure: INSERTION OF LEFT ARM ARTERIOVENOUS (AV) artegraft;  Surgeon: Nada Libman, MD;  Location: [facility] OR;  Service: Vascular;  Laterality: Left;   BASCILIC VEIN TRANSPOSITION Left 07/22/2019   Procedure: SECOND STAGE BASILIC VEIN TRANSPOSITION LEFT ARM;  Surgeon: Nada Libman, MD;  Location: [facility] OR;  Service: Vascular;  Laterality: Left;   COLONOSCOPY     INSERTION OF DIALYSIS CATHETER N/A 08/17/2017   Procedure: INSERTION OF DIALYSIS CATHETER;  Surgeon: Larina Earthly, MD;  Location: [facility] OR;  Service: Vascular;  Laterality: N/A;   INSERTION OF DIALYSIS CATHETER Right 09/21/2018   Procedure: INSERTION OF DIALYSIS CATHETER;  Surgeon: Cephus Shelling, MD;  Location: [facility] OR;  Service: Vascular;  Laterality: Right;   REVISION OF ARTERIOVENOUS GORETEX GRAFT Right 07/07/2018   Procedure: REVISION OF ARTERIOVENOUS GORETEX GRAFT;  Surgeon: Sherren Kerns, MD;  Location: [facility] OR;  Service: Vascular;  Laterality: Right;   SKIN BIOPSY      SKIN  CANCER 6 AREAS   THROMBECTOMY AND REVISION OF ARTERIOVENTOUS (AV) GORETEX  GRAFT Right 09/17/2018   Procedure: THROMBECTOMY AND REVISION OF ARTERIOVENTOUS (AV) GORETEX  GRAFT;  Surgeon: Nada Libman, MD;  Location: [facility] OR;  Service: Vascular;  Laterality: Right;   TUBUALIGATION      No Known Allergies   PHYSICAL EXAM: There were no vitals filed for this visit.  General: The patient is alert and oriented x3 in no acute distress.  Dermatology: Skin is cool in the toes, but overall the integrity of the skin does appear better overall since the last visit.  Less duskiness is seen around the toe wound right foot Wound 1 description:  Location: Dorsal right second toe    Depth: To dermis    Wound Border: No hyperkeratosis.     Wound Base: 80% granular base Odor?:  No    Surrounding Tissue: No erythema or edema or cyanosis noted today    Infected?:  No    Necrosis?:  No    Pain?:  Yes    Tunneling: None    Dimensions (cm): 0.7 cm x 0.3 cm x 0.1 cm  Vascular: Pedal pulses are absent.  Vascular test results: LOWER EXTREMITY ARTERIAL DUPLEX STUDY   Patient Name:  Latoya Lopez  Date of Exam:   07/03/2023  Medical Rec #: [id number]         Accession #:    [id number]  Date of Birth: September 17, 1941         Patient Gender: F  Patient Age:   75 years  Exam Location:  [place]  Procedure:      VAS [place] LOWER EXTREMITY ARTERIAL DUPLEX  Referring Phys: Joe Gee    ---------------------------------------------------------------------------  -----    Indications: PVD (peripheral vascular disease) (HCC) [I73.9 (ICD-10-CM)];  Skin              ulcer of toe of right foot with fat layer exposed (HCC)  [I34.742              (ICD-10-CM)]; Skin ulcer of toe of left foot, limited to  breakdown              of skin (HCC) [V95.638 (ICD-10-CM)].   Other Factors: ESRD on hemodialysis.   Current ABI: 05/25/2020 right 1.07 left 1.11   Comparison Study: No prior exam for comparison.   Performing  Technologist: Louie Boston RDCS     Examination Guidelines: A complete evaluation includes B-mode imaging,  spectral  Doppler, color Doppler, and power Doppler as needed of all accessible  portions  of each vessel. Bilateral testing is considered an integral part of a  complete  examination. Limited examinations for reoccurring indications may be  performed  as noted.       +----------+--------+-----+--------+--------+--------+  RIGHT    PSV cm/sRatioStenosisWaveformComments  +----------+--------+-----+--------+--------+--------+  EIA Distal99                   biphasic          +----------+--------+-----+--------+--------+--------+  CFA Distal71                   biphasic          +----------+--------+-----+--------+--------+--------+  DFA      80                   biphasic          +----------+--------+-----+--------+--------+--------+  SFA Prox  83                   biphasic          +----------+--------+-----+--------+--------+--------+  SFA Mid   92                   biphasic          +----------+--------+-----+--------+--------+--------+  SFA Distal107                  biphasic          +----------+--------+-----+--------+--------+--------+  POP Prox  66                   biphasic          +----------+--------+-----+--------+--------+--------+  ATA Mid   36                   biphasic          +----------+--------+-----+--------+--------+--------+  PTA Mid   48                   biphasic          +----------+--------+-----+--------+--------+--------+  PERO Mid  35                   biphasic          +----------+--------+-----+--------+--------+--------+        +----------+--------+-----+--------+--------+--------+  LEFT     PSV cm/sRatioStenosisWaveformComments  +----------+--------+-----+--------+--------+--------+  EIA Distal83                   biphasic           +----------+--------+-----+--------+--------+--------+  CFA Distal73                   biphasic          +----------+--------+-----+--------+--------+--------+  DFA      60                   biphasic          +----------+--------+-----+--------+--------+--------+  SFA Prox  75                   biphasic          +----------+--------+-----+--------+--------+--------+  SFA Mid   110                  biphasic          +----------+--------+-----+--------+--------+--------+  SFA Distal114                  biphasic          +----------+--------+-----+--------+--------+--------+  POP Prox  68                   biphasic          +----------+--------+-----+--------+--------+--------+  ATA Distal  occluded                  +----------+--------+-----+--------+--------+--------+  PTA Mid   76                   biphasic          +----------+--------+-----+--------+--------+--------+  PERO Mid  44                   biphasic          +----------+--------+-----+--------+--------+--------+   Anterior tibial artery f indings are characteristic of total occlusion.       Summary:  Right: Diffuse moderate calcific plaque. . No stenosis seen.   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 Diffuse  moderate calcific plaque. Dampened biphasic flow. Occluded anterior tibial  artery with colateral flow otherwise no stenosis.     See table(s) above for measurements and observations.      Electronically signed by Norman Herrlich MD on 07/04/2023 at 12:23:36 PM.       Patient Name:  Latoya Lopez  Date of Exam:   07/03/2023  Medical Rec #: [id number]         Accession #:    [id number]  Date of Birth: 03/25/1941         Patient Gender: F  Patient Age:   38 years  Exam Location:  [place]  Procedure:      VAS [place] ABI WITH/WO TBI  Referring Phys: Kirra Verga     ---------------------------------------------------------------------------  -----    Indications: PVD (peripheral vascular disease) (HCC) [I73.9 (ICD-10-CM)];  Skin              ulcer of toe of right foot with fat layer exposed (HCC)  [G40.102              (ICD-10-CM)]; Skin ulcer of toe of left foot, limited to  breakdown              of skin (HCC) [V25.366 (ICD-10-CM)]   Other Factors: ESRD on hemodialysis, left brachial fistula in place.   Comparison Study: ABI performed 05/25/2020 right 1.07 left 1.11   Performing Technologist: Reel, Jimmy RDCS, RVT     Examination Guidelines: A complete evaluation includes at minimum, Doppler  waveform signals and systolic blood pressure reading at the level of  bilateral  brachial, anterior tibial, and posterior tibial arteries, when vessel  segments  are accessible. Bilateral testing is considered an integral part of a  complete  examination. Photoelectric Plethysmograph (PPG) waveforms and toe systolic  pressure readings are included as required and additional duplex testing  as  needed. Limited examinations for reoccurring indications may be performed  as  noted.     ABI Findings:  +---------+------------------+-----+--------+--------+  Right   Rt Pressure (mmHg)IndexWaveformComment   +---------+------------------+-----+--------+--------+  Brachial 133                                      +---------+------------------+-----+--------+--------+  PTA     121               0.91                   +---------+------------------+-----+--------+--------+  DP      116               0.87                   +---------+------------------+-----+--------+--------+  Great Toe113               0.85                   +---------+------------------+-----+--------+--------+   +---------+------------------+-----+--------+-------+  Left    Lt Pressure (mmHg)IndexWaveformComment   +---------+------------------+-----+--------+-------+  PTA     146               1.10                  +---------+------------------+-----+--------+-------+  DP      112               0.84                  +---------+------------------+-----+--------+-------+  Great Toe113               0.85                  +---------+------------------+-----+--------+-------+           Summary:  Right: Resting right ankle-brachial index indicates mild right lower  extremity arterial disease. The right toe-brachial index is normal.   Left: Resting left ankle-brachial index is within normal range. The left  toe-brachial index is normal.    *See table(s) above for measurements and observations.      Electronically signed by Norman Herrlich MD on 07/04/2023 at 12:19:27 PM.     ASSESSMENT / PLAN OF CARE: 1. PVD (peripheral vascular disease) (HCC)   2. Skin ulcer of toe of right foot, limited to breakdown of skin [facility])     The vascular test results were reviewed in more detail today with the patient and her family member.  Explained how she has complete occlusion of the left anterior tibial artery and multiple atherosclerotic plaques in the right arterial vessels.  Also explained to the family member how prescription Trental works and helping change the shape of the red blood cell to squeeze through smaller vessels (i.e. toes).  The ulceration did not need debrided today.  It does appear to be improving and granulating in better now.  Medihoney ointment and DSD applied.  Reviewed off-loading with patient.  Informed patient that overall it does appear that the wound has finally shown some improvement since initiating the Pentoxyfylline medication.  She will continue with this medication, 400 mg daily, and she will take the tablet after dialysis on her dialysis days.  She can call if any refills are needed going forward.  Reviewed daily dressing changes with patient.  Discussed risks  / concerns regarding ulcer with patient and possible sequelae if left untreated.  Stressed importance of infection prevention at home. Short-term goals are: prevent infection, off-load ulcer, heal ulcer Long-term goals are:  prevent recurrence, prevent amputation.   Return in about 3 weeks (around 07/31/2023) for f/u ulcer.   Clerance Lav, DPM, FACFAS [facility]     [address] [place], [place] [address]                Office [phone]  Fax [phone]

## 2023-07-31 ENCOUNTER — Ambulatory Visit: Payer: Medicare Other | Admitting: Podiatry

## 2023-08-09 ENCOUNTER — Ambulatory Visit: Payer: Medicare Other | Admitting: Podiatry

## 2023-08-19 ENCOUNTER — Encounter: Payer: Self-pay | Admitting: Surgery

## 2023-08-19 ENCOUNTER — Ambulatory Visit (INDEPENDENT_AMBULATORY_CARE_PROVIDER_SITE_OTHER): Payer: Medicare Other | Admitting: Surgery

## 2023-08-19 VITALS — BP 91/29 | HR 73 | Temp 99.6°F | Resp 20 | Ht 69.0 in | Wt 145.0 lb

## 2023-08-19 DIAGNOSIS — N186 End stage renal disease: Secondary | ICD-10-CM | POA: Diagnosis not present

## 2023-08-19 DIAGNOSIS — I70213 Atherosclerosis of native arteries of extremities with intermittent claudication, bilateral legs: Secondary | ICD-10-CM

## 2023-08-19 DIAGNOSIS — Z992 Dependence on renal dialysis: Secondary | ICD-10-CM

## 2023-08-19 NOTE — Progress Notes (Signed)
[facility] [place]  Patient name: Latoya Lopez MRN: [id number] DOB: 11-01-1941 Sex: female   REQUESTING PROVIDER:    Dr. Burna Mortimer   REASON FOR CONSULT:    PAD  HISTORY OF PRESENT ILLNESS:   Latoya Lopez is a 82 y.o. female, who has been seen in the past for dialysis.  She has been seen and treated for bilateral toe ulcers which have all healed.  She complains of pain in both feet, right greater than left.  She describes this as burning and numbness.  She has taken gabapentin which does help.  She has had several falls recently.  She is able to ambulate with a walker.  She is a former smoker.  She is medically managed for hypertension.  She takes a statin for hypercholesterolemia.  PAST MEDICAL HISTORY    Past Medical History:  Diagnosis Date   Anemia of renal disease    Arthritis    osteoarthritis   Cancer (HCC)    skin- basal- 7 times and location   Dyspnea    when climbing stairs   ESRD (end stage renal disease) (HCC)    hemodialysis MWF at [facility] kidney center   GERD (gastroesophageal reflux disease)    Heart murmur    "Nothing to be concerned about, the heart Dr said."   Hyperkalemia    Hyperlipidemia    Hyperphosphatemia    Hypertension    Essential with goal blood pressure less than 130/80, 12/22/2019-    Hypokalemia    Hypoparathyroidism (HCC)    Hypothyroidism    Membranous glomerulonephritis    Proteinuria    S/P partial hysterectomy    Seasonal allergies    Secondary hyperparathyroidism, renal (HCC)    Wears glasses      FAMILY HISTORY   Family History  Problem Relation Age of Onset   Stroke Mother    Heart attack Father    Heart attack Sister     SOCIAL HISTORY:   Social History   Socioeconomic History   Marital status: Married    Spouse name: Not on file   Number of children: Not on file   Years of education: Not on file   Highest education level: Not on file   Occupational History   Not on file  Tobacco Use   Smoking status: Former    Current packs/day: 0.00    Types: Cigarettes    Quit date: 1997    Years since quitting: 27.7   Smokeless tobacco: Never   Tobacco comments:    quit smoking cigarettes in 1997  Vaping Use   Vaping status: Never Used  Substance and Sexual Activity   Alcohol use: No   Drug use: No   Sexual activity: Not on file  Other Topics Concern   Not on file  Social History Narrative   Not on file   Social Determinants of Health   Financial Resource Strain: Low Risk  (02/05/2022)   Received from [facility], [facility]   Overall Financial Resource Strain (CARDIA)    Difficulty of Paying Living Expenses: Not hard at all  Food Insecurity: Not on file  Transportation Needs: No Transportation Needs (02/09/2022)   Received from [facility], [facility]   PRAPARE - Transportation    Lack of Transportation (Medical): No    Lack of Transportation (Non-Medical): No  Physical Activity: Not on file  Stress: No Stress Concern Present (02/03/2022)   Received from [facility], [facility]   [place]  [employer] - Occupational Stress Questionnaire    Feeling of Stress : Not at all  Social Connections: Unknown (03/27/2022)   Received from [facility], [facility]   Social Network    Social Network: Not on file  Intimate Partner Violence: Unknown (02/12/2022)   Received from [facility], [facility]   HITS    Physically Hurt: Not on file    Insult or Talk Down To: Not on file    Threaten Physical Harm: Not on file    Scream or Curse: Not on file    ALLERGIES:    No Known Allergies  CURRENT MEDICATIONS:    Current Outpatient Medications  Medication Sig Dispense Refill   acetaminophen (TYLENOL) 500 MG tablet Take 500 mg by mouth every 6 (six) hours as needed for mild pain or headache.     amLODipine (NORVASC) 10 MG tablet Take 5 mg by mouth at bedtime.     atorvastatin  (LIPITOR) 80 MG tablet Take 80 mg by mouth daily with breakfast.      calcium acetate (PHOSLO) 667 MG capsule Take 2 capsules (1,334 mg total) by mouth 3 (three) times daily with meals. (Patient taking differently: Take 2,001 mg by mouth 2 (two) times daily with a meal.) 90 capsule 0   carvedilol (COREG) 12.5 MG tablet Take 12.5-25 mg by mouth See admin instructions. Take 1 tablet (12.5mg ) by mouth in the evening ONLY on Tuesdays, Thursdays, & Saturdays. Take 1 tablet (12.5 mg) by mouth twice daily on Sundays, Mondays, Wednesdays, & Fridays.     ferrous sulfate 325 (65 FE) MG tablet Take 1 tablet (325 mg total) by mouth 2 (two) times daily with a meal. 60 tablet 0   gabapentin (NEURONTIN) 100 MG capsule Take 100 mg by mouth daily.     leptospermum manuka honey (MEDIHONEY) PSTE paste Apply 1 Application topically daily. Apply to wounds and cover with gauze once daily 30 mL 2   levothyroxine (SYNTHROID, LEVOTHROID) 75 MCG tablet Take 75 mcg by mouth daily before breakfast.     lidocaine-prilocaine (EMLA) cream Apply 1 Application topically as needed. 30 g 0   loratadine (CLARITIN) 10 MG tablet Take 10 mg by mouth daily.      Multiple Vitamin (MULTIVITAMIN WITH MINERALS) TABS tablet Take 1 tablet by mouth daily.      pantoprazole (PROTONIX) 40 MG tablet Take 1 tablet (40 mg total) by mouth 2 (two) times daily. 60 tablet 0   pentoxifylline (TRENTAL) 400 MG CR tablet Take 1 tablet (400 mg total) by mouth daily. On your dialysis days, take your tablet after dialysis. 30 tablet 2   raloxifene (EVISTA) 60 MG tablet Take 60 mg by mouth daily with breakfast.      No current facility-administered medications for this visit.    REVIEW OF SYSTEMS:   [X]  denotes positive finding, [ ]  denotes negative finding Cardiac  Comments:  Chest pain or chest pressure:    Shortness of breath upon exertion:    Short of breath when lying flat:    Irregular heart rhythm:        Vascular    Pain in calf, thigh, or hip  brought on by ambulation:    Pain in feet at night that wakes you up from your sleep:     Blood clot in your veins:    Leg swelling:         Pulmonary    Oxygen at home:    Productive cough:  Wheezing:         Neurologic    Sudden weakness in arms or legs:     Sudden numbness in arms or legs:     Sudden onset of difficulty speaking or slurred speech:    Temporary loss of vision in one eye:     Problems with dizziness:         Gastrointestinal    Blood in stool:      Vomited blood:         Genitourinary    Burning when urinating:     Blood in urine:        Psychiatric    Major depression:         Hematologic    Bleeding problems:    Problems with blood clotting too easily:        Skin    Rashes or ulcers:        Constitutional    Fever or chills:     PHYSICAL EXAM:   Vitals:   08/19/23 1150  BP: (!) 91/29  Pulse: 73  Resp: 20  Temp: 99.6 F (37.6 C)  SpO2: 92%  Weight: 145 lb (65.8 kg)  Height: 5\' 9"  (1.753 m)    GENERAL: The patient is a well-nourished female, in no acute distress. The vital signs are documented above. CARDIAC: There is a regular rate and rhythm.  VASCULAR: Nonpalpable pedal pulses PULMONARY: Nonlabored respirations ABDOMEN: Soft and non-tender with normal pitched bowel sounds.  MUSCULOSKELETAL: There are no major deformities or cyanosis. NEUROLOGIC: No focal weakness or paresthesias are detected. SKIN: There are no ulcers or rashes noted. PSYCHIATRIC: The patient has a normal affect.  STUDIES:   I have reviewed the following: ABI Findings:  +---------+------------------+-----+--------+--------+  Right   Rt Pressure (mmHg)IndexWaveformComment   +---------+------------------+-----+--------+--------+  Brachial 133                                      +---------+------------------+-----+--------+--------+  PTA     121               0.91                   +---------+------------------+-----+--------+--------+   DP      116               0.87                   +---------+------------------+-----+--------+--------+  Great Toe113               0.85                   +---------+------------------+-----+--------+--------+   +---------+------------------+-----+--------+-------+  Left    Lt Pressure (mmHg)IndexWaveformComment  +---------+------------------+-----+--------+-------+  PTA     146               1.10                  +---------+------------------+-----+--------+-------+  DP      112               0.84                  +---------+------------------+-----+--------+-------+  Great Toe113               0.85                  +---------+------------------+-----+--------+-------+  Right toe pressure: 113 Left toe pressure: 113  Right: Diffuse moderate calcific plaque. . No stenosis seen.   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 Diffuse  moderate calcific plaque. Dampened biphasic flow. Occluded anterior tibial  artery with colateral flow otherwise no stenosis.     ASSESSMENT and PLAN   PAD with bilateral leg pain: Her symptoms are more consistent with neuropathy given that she denies claudication symptoms.  She describes her pain as tingling and burning and numbness.  She is on gabapentin which does help.  Potentially, her dose could be increased in the future.  She does have what appears to be tibial disease on her noninvasive imaging.  I proposed scheduling her for angiography to better define her anatomy and see if she has options to improve her blood flow.  At this time, the patient does not wish to have any procedure done.  Therefore we elected to have her come back for further discussions in approximately 6 months.  She will call us if she develops any new wounds.   Charlena Cross, MD, FACS [facility] [phone] Pager [phone]

## 2023-08-27 ENCOUNTER — Emergency Department (HOSPITAL_COMMUNITY)
Admission: EM | Admit: 2023-08-27 | Discharge: 2023-08-27 | Disposition: A | Discharge status: Home / Self Care | Payer: Medicare Other | Attending: Emergency Medicine | Admitting: Emergency Medicine

## 2023-08-27 DIAGNOSIS — I509 Heart failure, unspecified: Secondary | ICD-10-CM | POA: Diagnosis not present

## 2023-08-27 DIAGNOSIS — I132 Hypertensive heart and chronic kidney disease with heart failure and with stage 5 chronic kidney disease, or end stage renal disease: Secondary | ICD-10-CM | POA: Diagnosis not present

## 2023-08-27 DIAGNOSIS — Z992 Dependence on renal dialysis: Secondary | ICD-10-CM | POA: Diagnosis not present

## 2023-08-27 DIAGNOSIS — N186 End stage renal disease: Secondary | ICD-10-CM | POA: Diagnosis not present

## 2023-08-27 DIAGNOSIS — M79605 Pain in left leg: Secondary | ICD-10-CM | POA: Insufficient documentation

## 2023-08-27 DIAGNOSIS — M79604 Pain in right leg: Secondary | ICD-10-CM | POA: Insufficient documentation

## 2023-08-27 DIAGNOSIS — Z79899 Other long term (current) drug therapy: Secondary | ICD-10-CM | POA: Insufficient documentation

## 2023-08-27 LAB — CBC WITH DIFFERENTIAL/PLATELET
Abs Immature Granulocytes: 0.03 10*3/uL (ref 0.00–0.07)
Basophils Absolute: 0.1 10*3/uL (ref 0.0–0.1)
Basophils Relative: 1 %
Eosinophils Absolute: 0.3 10*3/uL (ref 0.0–0.5)
Eosinophils Relative: 5 %
HCT: 24.7 % — ABNORMAL LOW (ref 36.0–46.0)
Hemoglobin: 7.3 g/dL — ABNORMAL LOW (ref 12.0–15.0)
Immature Granulocytes: 0 %
Lymphocytes Relative: 33 %
Lymphs Abs: 2.3 10*3/uL (ref 0.7–4.0)
MCH: 28.3 pg (ref 26.0–34.0)
MCHC: 29.6 g/dL — ABNORMAL LOW (ref 30.0–36.0)
MCV: 95.7 fL (ref 80.0–100.0)
Monocytes Absolute: 0.7 10*3/uL (ref 0.1–1.0)
Monocytes Relative: 10 %
Neutro Abs: 3.6 10*3/uL (ref 1.7–7.7)
Neutrophils Relative %: 51 %
Platelets: 301 10*3/uL (ref 150–400)
RBC: 2.58 MIL/uL — ABNORMAL LOW (ref 3.87–5.11)
RDW: 18.3 % — ABNORMAL HIGH (ref 11.5–15.5)
WBC: 6.9 10*3/uL (ref 4.0–10.5)
nRBC: 0 % (ref 0.0–0.2)

## 2023-08-27 LAB — BASIC METABOLIC PANEL
Anion gap: 14 (ref 5–15)
BUN: 27 mg/dL — ABNORMAL HIGH (ref 8–23)
CO2: 28 mmol/L (ref 22–32)
Calcium: 9.2 mg/dL (ref 8.9–10.3)
Chloride: 96 mmol/L — ABNORMAL LOW (ref 98–111)
Creatinine, Ser: 5.17 mg/dL — ABNORMAL HIGH (ref 0.44–1.00)
GFR, Estimated: 8 mL/min — ABNORMAL LOW (ref >60)
Glucose, Bld: 92 mg/dL (ref 70–99)
Potassium: 4.3 mmol/L (ref 3.5–5.1)
Sodium: 138 mmol/L (ref 135–145)

## 2023-08-27 LAB — MAGNESIUM: Magnesium: 1.8 mg/dL (ref 1.7–2.4)

## 2023-08-27 NOTE — Discharge Instructions (Signed)
You were seen in the Emergency Department for leg pain Your blood work and EKG looked okay The leg pain is most likely due to a chronic issue You need to call the vascular surgery office again and tell them you want the test which is called angiography scheduled You did not need emergent dialysis here tonight but you should follow-up with your dialysis team as scheduled on Thursday Return to the emergency department if you are unable to walk, have severe pain or for any other concerns

## 2023-08-27 NOTE — ED Triage Notes (Signed)
Dialysis center [facility] due patient pain on legs. 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 Pt was schedule for having a procedure "to check blood flow on both legs on 10/07, however, MD cancelled the procedure".  Pt stated leg feels as usual, but wants the procedure done.   Dialysis schedule TTS, pt didn't have any treatment today.   PT Aox4, pain 9 out of 10 in both legs. Stated living at home with husband

## 2023-08-27 NOTE — ED Provider Notes (Signed)
[place] EMERGENCY DEPARTMENT AT [facility] Provider Note   CSN: [id number] Arrival date & time: 08/27/23  1320     History  Chief Complaint  Patient presents with   Leg Pain    Bilateral, chronic     Latoya Lopez is a 82 y.o. female.  With a past medical history of ESRD on dialysis (TTS), heart failure, hypertension and peripheral neuropathy presents to the ED for lower extremity pain.She has experienced bilateral lower extremity pain and paresthesias for months to years.  For this reason she was seen by vascular surgery (Dr. Myra Gianotti) primary doctor on October 7.  At that time the vascular team had planned to schedule an outpatient angiography of bilateral lower extremities however patient did not wish to have the procedure done at this time and there was a plan for follow-up in 6 months or sooner if she develops any new symptoms or wounds.  She presents to the ED today stating that she wants the test done now as her ongoing bilateral lower extremity pain has been bothersome for her.  No new injuries or lesions to the lower extremities.  No fevers chills chest pain or shortness of breath.  She was scheduled for dialysis earlier today but did not attend this appointment as she decided to come to the ED instead.   Leg Pain      Home Medications Prior to Admission medications   Medication Sig Start Date End Date Taking? Authorizing Provider  acetaminophen (TYLENOL) 500 MG tablet Take 500 mg by mouth every 6 (six) hours as needed for mild pain or headache.    [provider]  amLODipine (NORVASC) 10 MG tablet Take 5 mg by mouth at bedtime. 05/11/19   [provider]  atorvastatin (LIPITOR) 80 MG tablet Take 80 mg by mouth daily with breakfast.     [provider]  calcium acetate (PHOSLO) 667 MG capsule Take 2 capsules (1,334 mg total) by mouth 3 (three) times daily with meals. Patient taking differently: Take 2,001 mg by mouth 2 (two) times  daily with a meal. 08/21/17   Maxie Barb, MD  carvedilol (COREG) 12.5 MG tablet Take 12.5-25 mg by mouth See admin instructions. Take 1 tablet (12.5mg ) by mouth in the evening ONLY on Tuesdays, Thursdays, & Saturdays. Take 1 tablet (12.5 mg) by mouth twice daily on Sundays, Mondays, Wednesdays, & Fridays.    [provider]  ferrous sulfate 325 (65 FE) MG tablet Take 1 tablet (325 mg total) by mouth 2 (two) times daily with a meal. 03/09/21   Leroy Sea, MD  gabapentin (NEURONTIN) 100 MG capsule Take 100 mg by mouth daily. 05/04/20   [provider]  leptospermum manuka honey (MEDIHONEY) PSTE paste Apply 1 Application topically daily. Apply to wounds and cover with gauze once daily 06/05/23   McCaughan, Dia D, DPM  levothyroxine (SYNTHROID, LEVOTHROID) 75 MCG tablet Take 75 mcg by mouth daily before breakfast.    [provider]  lidocaine-prilocaine (EMLA) cream Apply 1 Application topically as needed. 06/05/23   McCaughan, Dia D, DPM  loratadine (CLARITIN) 10 MG tablet Take 10 mg by mouth daily.     [provider]  Multiple Vitamin (MULTIVITAMIN WITH MINERALS) TABS tablet Take 1 tablet by mouth daily.     [provider]  pantoprazole (PROTONIX) 40 MG tablet Take 1 tablet (40 mg total) by mouth 2 (two) times daily. 03/09/21   Leroy Sea, MD  pentoxifylline (TRENTAL) 400  MG CR tablet Take 1 tablet (400 mg total) by mouth daily. On your dialysis days, take your tablet after dialysis. 06/22/23 09/20/23  McCaughan, Dia D, DPM  raloxifene (EVISTA) 60 MG tablet Take 60 mg by mouth daily with breakfast.     [provider]      Allergies    Patient has no known allergies.    Review of Systems   Review of Systems  Physical Exam Updated Vital Signs BP 116/61   Pulse 80   Temp 98.1 F (36.7 C)   Resp 16   SpO2 97%  Physical Exam Vitals and nursing note reviewed.  HENT:     Head: Normocephalic and atraumatic.  Eyes:      Pupils: Pupils are equal, round, and reactive to light.  Cardiovascular:     Rate and Rhythm: Normal rate and regular rhythm.  Pulmonary:     Effort: Pulmonary effort is normal.     Breath sounds: Normal breath sounds.  Abdominal:     Palpations: Abdomen is soft.     Tenderness: There is no abdominal tenderness.  Musculoskeletal:     Comments: Unable to palpate DP or PT pulses Biphasic DP and PT pulses bilaterally with Doppler Sensation intact to light touch throughout bilateral lower extremities 5-5 symmetric strength throughout bilateral lower extremities  Skin:    General: Skin is warm and dry.  Neurological:     Mental Status: She is alert.  Psychiatric:        Mood and Affect: Mood normal.     ED Results / Procedures / Treatments   Labs (all labs ordered are listed, but only abnormal results are displayed) Labs Reviewed  CBC WITH DIFFERENTIAL/PLATELET - Abnormal; Notable for the following components:      Result Value   RBC 2.58 (*)    Hemoglobin 7.3 (*)    HCT 24.7 (*)    MCHC 29.6 (*)    RDW 18.3 (*)    All other components within normal limits  BASIC METABOLIC PANEL - Abnormal; Notable for the following components:   Chloride 96 (*)    BUN 27 (*)    Creatinine, Ser 5.17 (*)    GFR, Estimated 8 (*)    All other components within normal limits  MAGNESIUM    EKG None  Radiology No results found.  Procedures Procedures    Medications Ordered in ED Medications - No data to display  ED Course/ Medical Decision Making/ A&P Clinical Course as of 08/27/23 1734  Tue Aug 27, 2023  1730 No major electrolyte abnormalities.  Renal function at baseline.  CBC appears similar to previous.  Patient will be discharged with instructions to follow-up with vascular surgeon to have angiography completed as outpatient.  I have messaged her vascular surgeon as well [MP]    Clinical Course User Index [MP] Royanne Foots, DO                                 Medical  Decision Making 82 year old female with history as above presenting for chronic lower extremity pain.  She was recently seen by vascular surgery as an outpatient 1 week ago and it appears as though she declined offer of bilateral lower extremity angiography at that time.  She presented from her scheduled dialysis appointment today due to ongoing lower extremity pain.  No inciting injuries or new skin lesions.  Good biphasic pulses bilaterally DP  PT. low suspicion for acute ischemia.  Will obtain basic laboratory workup and EKG to ascertain urgent need for dialysis tonight.  Otherwise she will be instructed to follow-up with the vascular surgery office again in order to schedule angiography as was initially intended           Final Clinical Impression(s) / ED Diagnoses Final diagnoses:  Right leg pain  Left leg pain  ESRD (end stage renal disease) [facility])    Rx / DC Orders ED Discharge Orders     None         Royanne Foots, DO 08/27/23 1735

## 2023-08-29 ENCOUNTER — Telehealth: Payer: Self-pay

## 2023-08-29 NOTE — Telephone Encounter (Signed)
Attempted to reach patient to schedule angiogram. Left VM for patient to return call.

## 2023-08-29 NOTE — Telephone Encounter (Signed)
-----   Message from Durene Cal sent at 08/28/2023  6:55 PM EDT ----- Regarding: RE: Please Advise Just schedule angio ----- Message ----- From: Hurshel Keys, RN Sent: 08/28/2023   4:35 PM EDT To: Nada Libman, MD Subject: Please Advise                                  Dr. Myra Gianotti,  This pt called, at [facility] ED last night with leg pain, she now wants the AGM. Can that be scheduled based off your last OV note or do you need to see her in person again?  Thanks, Lawanna Kobus, RN - Triage

## 2023-08-30 ENCOUNTER — Other Ambulatory Visit: Payer: Self-pay

## 2023-08-30 DIAGNOSIS — I70213 Atherosclerosis of native arteries of extremities with intermittent claudication, bilateral legs: Secondary | ICD-10-CM

## 2023-08-30 MED ORDER — SODIUM CHLORIDE 0.9 % IV SOLN: 250.0000 mL | INTRAVENOUS | Status: AC | PRN

## 2023-08-30 MED ORDER — SODIUM CHLORIDE 0.9% FLUSH: 3.0000 mL | Freq: Two times a day (BID) | INTRAVENOUS | Status: DC

## 2023-08-30 NOTE — Telephone Encounter (Signed)
Patient returned call and scheduled for angiogram on non-HD day with Dr. Karin Lieu on 10/23. Instructions provided and she voiced understanding.

## 2023-09-04 ENCOUNTER — Encounter (HOSPITAL_COMMUNITY)
Admission: RE | Disposition: A | Discharge status: Home / Self Care | Payer: Self-pay | Source: Home / Self Care | Attending: Vascular Surgery

## 2023-09-04 ENCOUNTER — Other Ambulatory Visit: Payer: Self-pay

## 2023-09-04 ENCOUNTER — Encounter (HOSPITAL_COMMUNITY): Payer: Self-pay | Admitting: Vascular Surgery

## 2023-09-04 ENCOUNTER — Ambulatory Visit (HOSPITAL_COMMUNITY)
Admission: RE | Admit: 2023-09-04 | Discharge: 2023-09-04 | Disposition: A | Discharge status: Home / Self Care | Payer: Medicare Other | Attending: Vascular Surgery | Admitting: Vascular Surgery

## 2023-09-04 DIAGNOSIS — I70223 Atherosclerosis of native arteries of extremities with rest pain, bilateral legs: Secondary | ICD-10-CM | POA: Diagnosis present

## 2023-09-04 DIAGNOSIS — N186 End stage renal disease: Secondary | ICD-10-CM | POA: Diagnosis not present

## 2023-09-04 DIAGNOSIS — Z992 Dependence on renal dialysis: Secondary | ICD-10-CM | POA: Insufficient documentation

## 2023-09-04 DIAGNOSIS — I120 Hypertensive chronic kidney disease with stage 5 chronic kidney disease or end stage renal disease: Secondary | ICD-10-CM | POA: Insufficient documentation

## 2023-09-04 DIAGNOSIS — Z87891 Personal history of nicotine dependence: Secondary | ICD-10-CM | POA: Diagnosis not present

## 2023-09-04 DIAGNOSIS — I70213 Atherosclerosis of native arteries of extremities with intermittent claudication, bilateral legs: Secondary | ICD-10-CM

## 2023-09-04 DIAGNOSIS — E7800 Pure hypercholesterolemia, unspecified: Secondary | ICD-10-CM | POA: Diagnosis not present

## 2023-09-04 DIAGNOSIS — Z79899 Other long term (current) drug therapy: Secondary | ICD-10-CM | POA: Diagnosis not present

## 2023-09-04 HISTORY — PX: PERIPHERAL VASCULAR INTERVENTION: CATH118257

## 2023-09-04 HISTORY — PX: ABDOMINAL AORTOGRAM W/LOWER EXTREMITY: CATH118223

## 2023-09-04 LAB — POCT I-STAT, CHEM 8
BUN: 28 mg/dL — ABNORMAL HIGH (ref 8–23)
Calcium, Ion: 1.16 mmol/L (ref 1.15–1.40)
Chloride: 95 mmol/L — ABNORMAL LOW (ref 98–111)
Creatinine, Ser: 4.4 mg/dL — ABNORMAL HIGH (ref 0.44–1.00)
Glucose, Bld: 79 mg/dL (ref 70–99)
HCT: 28 % — ABNORMAL LOW (ref 36.0–46.0)
Hemoglobin: 9.5 g/dL — ABNORMAL LOW (ref 12.0–15.0)
Potassium: 4.2 mmol/L (ref 3.5–5.1)
Sodium: 137 mmol/L (ref 135–145)
TCO2: 33 mmol/L — ABNORMAL HIGH (ref 22–32)

## 2023-09-04 SURGERY — ABDOMINAL AORTOGRAM W/LOWER EXTREMITY
Anesthesia: LOCAL

## 2023-09-04 MED ORDER — HEPARIN SODIUM (PORCINE) 1000 UNIT/ML IJ SOLN
INTRAMUSCULAR | Status: DC | PRN
  Administered 2023-09-04: 5000 [IU] via INTRAVENOUS

## 2023-09-04 MED ORDER — CLOPIDOGREL BISULFATE 75 MG PO TABS
300.0000 mg | ORAL_TABLET | Freq: Once | ORAL | Status: AC
  Administered 2023-09-04: 300 mg via ORAL
  Filled 2023-09-04: qty 4

## 2023-09-04 MED ORDER — CLOPIDOGREL BISULFATE 75 MG PO TABS
75.0000 mg | ORAL_TABLET | Freq: Every day | ORAL | 11 refills | Status: DC
Start: 2023-09-04 — End: 2023-09-13

## 2023-09-04 MED ORDER — HEPARIN SODIUM (PORCINE) 1000 UNIT/ML IJ SOLN
INTRAMUSCULAR | Status: AC
  Filled 2023-09-04: qty 10

## 2023-09-04 MED ORDER — SODIUM CHLORIDE 0.9% FLUSH: 3.0000 mL | INTRAVENOUS | Status: DC | PRN

## 2023-09-04 MED ORDER — ONDANSETRON HCL 4 MG/2ML IJ SOLN: 4.0000 mg | Freq: Four times a day (QID) | INTRAMUSCULAR | Status: DC | PRN

## 2023-09-04 MED ORDER — LABETALOL HCL 5 MG/ML IV SOLN: 10.0000 mg | INTRAVENOUS | Status: DC | PRN

## 2023-09-04 MED ORDER — LIDOCAINE HCL (PF) 1 % IJ SOLN
INTRAMUSCULAR | Status: DC | PRN
  Administered 2023-09-04: 2 mL via INTRADERMAL

## 2023-09-04 MED ORDER — SODIUM CHLORIDE 0.9 % WEIGHT BASED INFUSION: 1.0000 mL/kg/h | INTRAVENOUS | Status: DC

## 2023-09-04 MED ORDER — HEPARIN (PORCINE) IN NACL 2000-0.9 UNIT/L-% IV SOLN
INTRAVENOUS | Status: DC | PRN
  Administered 2023-09-04: 1000 mL

## 2023-09-04 MED ORDER — FENTANYL CITRATE (PF) 100 MCG/2ML IJ SOLN
INTRAMUSCULAR | Status: DC | PRN
  Administered 2023-09-04: 50 ug via INTRAVENOUS

## 2023-09-04 MED ORDER — ASPIRIN 81 MG PO TBEC
81.0000 mg | DELAYED_RELEASE_TABLET | Freq: Every day | ORAL | Status: DC
  Administered 2023-09-04: 81 mg via ORAL
  Filled 2023-09-04: qty 1

## 2023-09-04 MED ORDER — ACETAMINOPHEN 325 MG PO TABS
650.0000 mg | ORAL_TABLET | ORAL | Status: DC | PRN
Start: 2023-09-04 — End: 2023-09-04

## 2023-09-04 MED ORDER — FENTANYL CITRATE (PF) 100 MCG/2ML IJ SOLN
INTRAMUSCULAR | Status: AC
  Filled 2023-09-04: qty 2

## 2023-09-04 MED ORDER — CLOPIDOGREL BISULFATE 75 MG PO TABS: 75.0000 mg | ORAL_TABLET | Freq: Every day | ORAL | Status: DC

## 2023-09-04 MED ORDER — IODIXANOL 320 MG/ML IV SOLN
INTRAVENOUS | Status: DC | PRN
  Administered 2023-09-04: 135 mL via INTRA_ARTERIAL

## 2023-09-04 MED ORDER — LIDOCAINE HCL (PF) 1 % IJ SOLN
INTRAMUSCULAR | Status: AC
  Filled 2023-09-04: qty 30

## 2023-09-04 MED ORDER — SODIUM CHLORIDE 0.9 % IV SOLN
250.0000 mL | INTRAVENOUS | Status: DC | PRN
Start: 2023-09-04 — End: 2023-09-04

## 2023-09-04 MED ORDER — HYDRALAZINE HCL 20 MG/ML IJ SOLN: 5.0000 mg | INTRAMUSCULAR | Status: DC | PRN

## 2023-09-04 MED ORDER — ASPIRIN 81 MG PO TBEC: 81.0000 mg | DELAYED_RELEASE_TABLET | Freq: Every day | ORAL | 2 refills | Status: DC

## 2023-09-04 SURGICAL SUPPLY — 18 items
BALLN IN.PACT DCB 4X40 (BALLOONS) ×2
BALLN MUSTANG 4X40X135 (BALLOONS) ×2
BALLOON MUSTANG 4X40X135 (BALLOONS) IMPLANT
CATH BEACON 5 .035 65 VANSC3 (CATHETERS) IMPLANT
COVER DOME SNAP 22 D (MISCELLANEOUS) IMPLANT
DCB IN.PACT 4X40 (BALLOONS) IMPLANT
DEVICE CLOSURE MYNXGRIP 6/7F (Vascular Products) IMPLANT
GLIDEWIRE ADV .035X260CM (WIRE) IMPLANT
KIT ENCORE 26 ADVANTAGE (KITS) IMPLANT
KIT MICROPUNCTURE NIT STIFF (SHEATH) IMPLANT
KIT SYRINGE INJ CVI SPIKEX1 (MISCELLANEOUS) IMPLANT
SET ATX-X65L (MISCELLANEOUS) IMPLANT
SHEATH CATAPULT 6FR 60 (SHEATH) IMPLANT
SHEATH PINNACLE 5F 10CM (SHEATH) IMPLANT
SHEATH PINNACLE 6F 10CM (SHEATH) IMPLANT
SHEATH PROBE COVER 6X72 (BAG) IMPLANT
TRAY PV CATH (CUSTOM PROCEDURE TRAY) ×2 IMPLANT
WIRE BENTSON .035X145CM (WIRE) IMPLANT

## 2023-09-04 NOTE — Discharge Instructions (Signed)
Femoral Site Care This sheet gives you information about how to care for yourself after your procedure. Your health care provider may also give you more specific instructions. If you have problems or questions, contact your health care provider. What can I expect after the procedure?  After the procedure, it is common to have: Bruising that usually fades within 1-2 weeks. Tenderness at the site. Follow these instructions at home: Wound care Follow instructions from your health care provider about how to take care of your insertion site. Make sure you: Wash your hands with soap and water before you change your bandage (dressing). If soap and water are not available, use hand sanitizer. Remove your dressing as told by your health care provider. In 24 hours Do not take baths, swim, or use a hot tub until your health care provider approves. You may shower 24-48 hours after the procedure or as told by your health care provider. Gently wash the site with plain soap and water. Pat the area dry with a clean towel. Do not rub the site. This may cause bleeding. Do not apply powder or lotion to the site. Keep the site clean and dry. Check your femoral site every day for signs of infection. Check for: Redness, swelling, or pain. Fluid or blood. Warmth. Pus or a bad smell. Activity For the first 2-3 days after your procedure, or as long as directed: Avoid climbing stairs as much as possible. Do not squat. Do not lift anything that is heavier than 10 lb (4.5 kg), or the limit that you are told, until your health care provider says that it is safe. For 5 days Rest as directed. Avoid sitting for a long time without moving. Get up to take short walks every 1-2 hours. Do not drive for 24 hours if you were given a medicine to help you relax (sedative). General instructions Take over-the-counter and prescription medicines only as told by your health care provider. Keep all follow-up visits as told by  your health care provider. This is important. Contact a health care provider if you have: A fever or chills. You have redness, swelling, or pain around your insertion site. Get help right away if: The catheter insertion area swells very fast. You pass out. You suddenly start to sweat or your skin gets clammy. The catheter insertion area is bleeding, and the bleeding does not stop when you hold steady pressure on the area. The area near or just beyond the catheter insertion site becomes pale, cool, tingly, or numb. These symptoms may represent a serious problem that is an emergency. Do not wait to see if the symptoms will go away. Get medical help right away. Call your local emergency services (911 in the [place] Do not drive yourself to the hospital. Summary After the procedure, it is common to have bruising that usually fades within 1-2 weeks. Check your femoral site every day for signs of infection. Do not lift anything that is heavier than 10 lb (4.5 kg), or the limit that you are told, until your health care provider says that it is safe. This information is not intended to replace advice given to you by your health care provider. Make sure you discuss any questions you have with your health care provider. Document Revised: 11/11/2017 Document Reviewed: 11/11/2017 Elsevier Patient Education  2020 [employer]vier Inc[employer] 

## 2023-09-04 NOTE — H&P (Signed)
[facility]  Patient seen and examined in preop holding.  No complaints. No changes to medication history or physical exam since last seen in clinic. After discussing the risks and benefits of bilateral lower extremity angiogram in an effort to define lower extremity perfusion, and possibly improved to eliminate rest pain, Latoya Lopez elected to proceed.   After evaluating studies, Latoya Lopez is aware that I think that there is a low likelihood of intervention, however with her continued pain, we will rule this out as she is not palpable.  Right leg with more pain.  Will access left common femoral artery in retrograde fashion.  Victorino Sparrow MD     Patient name: Latoya Lopez MRN: [id number] DOB: Jun 20, 1941 Sex: female   REQUESTING PROVIDER:    Dr. Burna Mortimer   REASON FOR CONSULT:    PAD  HISTORY OF PRESENT ILLNESS:   ANAIJA SORRENTI is a 82 y.o. female, who has been seen in the past for dialysis.  She has been seen and treated for bilateral toe ulcers which have all healed.  She complains of pain in both feet, right greater than left.  She describes this as burning and numbness.  She has taken gabapentin which does help.  She has had several falls recently.  She is able to ambulate with a walker.  She is a former smoker.  She is medically managed for hypertension.  She takes a statin for hypercholesterolemia.  PAST MEDICAL HISTORY    Past Medical History:  Diagnosis Date   Anemia of renal disease    Arthritis    osteoarthritis   Cancer (HCC)    skin- basal- 7 times and location   Dyspnea    when climbing stairs   ESRD (end stage renal disease) (HCC)    hemodialysis T-Th-S  at [facility]   GERD (gastroesophageal reflux disease)    Heart murmur    "Nothing to be concerned about, the heart Dr said."   Hyperkalemia    Hyperlipidemia    Hyperphosphatemia    Hypertension    Essential  with goal blood pressure less than 130/80, 12/22/2019-    Hypokalemia    Hypoparathyroidism (HCC)    Hypothyroidism    Membranous glomerulonephritis    Proteinuria    S/P partial hysterectomy    Seasonal allergies    Secondary hyperparathyroidism, renal (HCC)    Wears glasses      FAMILY HISTORY   Family History  Problem Relation Age of Onset   Stroke Mother    Heart attack Father    Heart attack Sister     SOCIAL HISTORY:   Social History   Socioeconomic History   Marital status: Married    Spouse name: Not on file   Number of children: Not on file   Years of education: Not on file   Highest education level: Not on file  Occupational History   Not on file  Tobacco Use   Smoking status: Former    Current packs/day: 0.00    Types: Cigarettes    Quit date: 1997    Years since quitting: 27.8   Smokeless tobacco: Never   Tobacco comments:    quit smoking cigarettes in 1997  Vaping Use   Vaping status: Never Used  Substance and Sexual Activity   Alcohol use: No   Drug use: No   Sexual activity: Not on file  Other Topics Concern   Not on file  Social  History Narrative   Not on file   Social Determinants of Health   Financial Resource Strain: Low Risk  (02/05/2022)   Received from [facility], [facility]   Overall Financial Resource Strain (CARDIA)    Difficulty of Paying Living Expenses: Not hard at all  Food Insecurity: Not on file  Transportation Needs: No Transportation Needs (02/09/2022)   Received from [facility], [facility]   PRAPARE - Transportation    Lack of Transportation (Medical): No    Lack of Transportation (Non-Medical): No  Physical Activity: Not on file  Stress: No Stress Concern Present (02/03/2022)   Received from [facility], [facility] of Occupational Health - Occupational Stress Questionnaire    Feeling of Stress : Not at all  Social Connections: Unknown (03/27/2022)   Received from [facility],  [facility]   Social Network    Social Network: Not on file  Intimate Partner Violence: Unknown (02/12/2022)   Received from [facility], [facility]   HITS    Physically Hurt: Not on file    Insult or Talk Down To: Not on file    Threaten Physical Harm: Not on file    Scream or Curse: Not on file    ALLERGIES:    No Known Allergies  CURRENT MEDICATIONS:    Current Facility-Administered Medications  Medication Dose Route Frequency Provider Last Rate Last Admin   Heparin (Porcine) in NaCl 2000-0.9 UNIT/L-% SOLN    PRN Victorino Sparrow, MD   1,000 mL at 09/04/23 0949   sodium chloride flush (NS) 0.9 % injection 3 mL  3 mL Intravenous PRN Victorino Sparrow, MD        REVIEW OF SYSTEMS:   [X]  denotes positive finding, [ ]  denotes negative finding Cardiac  Comments:  Chest pain or chest pressure:    Shortness of breath upon exertion:    Short of breath when lying flat:    Irregular heart rhythm:        Vascular    Pain in calf, thigh, or hip brought on by ambulation:    Pain in feet at night that wakes you up from your sleep:     Blood clot in your veins:    Leg swelling:         Pulmonary    Oxygen at home:    Productive cough:     Wheezing:         Neurologic    Sudden weakness in arms or legs:     Sudden numbness in arms or legs:     Sudden onset of difficulty speaking or slurred speech:    Temporary loss of vision in one eye:     Problems with dizziness:         Gastrointestinal    Blood in stool:      Vomited blood:         Genitourinary    Burning when urinating:     Blood in urine:        Psychiatric    Major depression:         Hematologic    Bleeding problems:    Problems with blood clotting too easily:        Skin    Rashes or ulcers:        Constitutional    Fever or chills:     PHYSICAL EXAM:   Vitals:   09/04/23 0756  BP: 102/86  Pulse: 92  Resp: Marland Kitchen)  21  Temp: (!) 97.2 F (36.2 C)  TempSrc: Oral  SpO2: 91%  Weight: 65.8  kg  Height: 5\' 9"  (1.753 m)    GENERAL: The patient is a well-nourished female, in no acute distress. The vital signs are documented above. CARDIAC: There is a regular rate and rhythm.  VASCULAR: Nonpalpable pedal pulses PULMONARY: Nonlabored respirations ABDOMEN: Soft and non-tender with normal pitched bowel sounds.  MUSCULOSKELETAL: There are no major deformities or cyanosis. NEUROLOGIC: No focal weakness or paresthesias are detected. SKIN: There are no ulcers or rashes noted. PSYCHIATRIC: The patient has a normal affect.  STUDIES:   I have reviewed the following: ABI Findings:  +---------+------------------+-----+--------+--------+  Right   Rt Pressure (mmHg)IndexWaveformComment   +---------+------------------+-----+--------+--------+  Brachial 133                                      +---------+------------------+-----+--------+--------+  PTA     121               0.91                   +---------+------------------+-----+--------+--------+  DP      116               0.87                   +---------+------------------+-----+--------+--------+  Great Toe113               0.85                   +---------+------------------+-----+--------+--------+   +---------+------------------+-----+--------+-------+  Left    Lt Pressure (mmHg)IndexWaveformComment  +---------+------------------+-----+--------+-------+  PTA     146               1.10                  +---------+------------------+-----+--------+-------+  DP      112               0.84                  +---------+------------------+-----+--------+-------+  Great Toe113               0.85                  +---------+------------------+-----+--------+-------+  Right toe pressure: 113 Left toe pressure: 113  Right: Diffuse moderate calcific plaque. . No stenosis seen.   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 Diffuse  moderate calcific plaque.  Dampened biphasic flow. Occluded anterior tibial  artery with colateral flow otherwise no stenosis.     ASSESSMENT and PLAN   PAD with bilateral leg pain: Her symptoms are more consistent with neuropathy given that she denies claudication symptoms.  She describes her pain as tingling and burning and numbness.  She is on gabapentin which does help.  Potentially, her dose could be increased in the future.  She does have what appears to be tibial disease on her noninvasive imaging.  I proposed scheduling her for angiography to better define her anatomy and see if she has options to improve her blood flow.  At this time, the patient does not wish to have any procedure done.  Therefore we elected to have her come back for further discussions in approximately 6 months.  She will call us if she develops any new wounds.  Charlena Cross, MD, FACS [facility] [phone] Pager [phone]

## 2023-09-04 NOTE — Op Note (Signed)
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  Ultrasound-guided micropuncture access of the left common femoral artery in retrograde fashion 2.  Aortogram 3.  Second-order cannulation, left lower extremity angiogram 4.  Third order cannulation, left lower extremity angiogram 5.  Drug-coated balloon angioplasty 4x34mm superficial femoral artery 6.  Device is to closure-Mynx   Indications: Patient is an 82 year old female with history of bilateral lower extremity pain.  ABIs appeared to demonstrate mild to moderate disease, with bilateral lower extremity duplex demonstrating tibial disease.  On physical exam she had a nonpalpable pulse.  Being that she continues to have rest pain in bilateral lower extremities, we discussed angiography of bilateral lower extremities to rule out peripheral arterial disease as a culprit.  After discussing risk and benefits of angiogram with possible intervention, Latoya Lopez elected to proceed.  Findings:  Aorta: Widely patent infrarenal aorta, no flow-limiting stenosis in the aortoiliac segments bilaterally On the right: Widely patent common femoral artery, profunda, superficial femoral artery patent with focal area of 75% stenosis.  Patent profunda.  Two-vessel peroneal, posterior tibial outflow to the foot filling plantar branches in the foot.  On the left: Widely patent common femoral artery, frontal, superficial femoral artery, popliteal artery.  High takeoff of the anterior tibial artery which became atretic and occluded.  Two-vessel runoff to the level of the foot via the peroneal and posterior tibial arteries which continued to the foot via plantar arteries.   Procedure:  The patient was identified in the holding area and taken to room 8.  The  patient was then placed supine on the table and prepped and draped in the usual sterile fashion.  A time out was called.  Ultrasound was used to evaluate the left common femoral artery.  It was patent .  A digital ultrasound image was acquired.  A micropuncture needle was used to access the left common femoral artery under ultrasound guidance.  An 018 wire was advanced without resistance and a micropuncture sheath was placed.  The 018 wire was removed and a benson wire was placed.  The micropuncture sheath was exchanged for a 5 french sheath.  An omniflush catheter was advanced over the wire to the level of L-1.  An abdominal angiogram was obtained.  The catheter was then pulled down to the aortic bifurcation and bilateral lower extremity angiogram followed.  There was poor opacification below the level of the knee, therefore using the omniflush catheter and a benson wire, the aortic bifurcation was crossed and the catheter was placed into theright external iliac artery and right runoff was obtained.  Runoff was difficult to assess, therefore the flush catheter was then positioned into the superficial femoral artery for continued runoff of the right lower extremity.  Intervention followed.  Prior to closure, the remainder of the left lower extremity was assessed through retrograde injections from the sheath.  See results above.   I elected to intervene on the 75% focal stenosis appreciated in the distal superficial femoral artery.  A 6 x 60 cm sheath was brought to field and parked in the proximal superficial femoral artery.  The patient was heparinized.  A series of wires and catheters were used to cross the lesion, and a 4 x 40 mm balloon was brought onto the field.  This was inflated across the lesion.  Follow-up imaging demonstrated significant improvement however there was some luminal irregularity.  I elected to reinflated balloon across the lesion, and she was using drug-coated balloon for angioplasty.   The drug-coated balloon was sized at 4 x 40 mm.  This was inflated for 3 minutes.  Follow-up angiography demonstrated excellent result with resolution of flow-limiting stenosis.    The sheath was pulled back and to be followed of the remainder of the left lower extremity.  See results above.  The left-sided percutaneous access site was closed using a minx device without issue.    Impression: Successful drug-coated balloon angioplasty of 75% stenosis in the right distal superficial femoral artery.  Patient with two-vessel runoff to the foot bilaterally.  I expect to the right lower extremity pain to improve, but being that she has mild amount of left lower extremity rest pain, I think there is another etiology as well.        Victorino Sparrow MD [facility] Office: [phone]

## 2023-09-05 ENCOUNTER — Encounter (HOSPITAL_COMMUNITY): Payer: Self-pay | Admitting: Vascular Surgery

## 2023-09-11 ENCOUNTER — Inpatient Hospital Stay (HOSPITAL_COMMUNITY): Payer: Medicare Other

## 2023-09-11 ENCOUNTER — Encounter (HOSPITAL_COMMUNITY): Payer: Self-pay | Admitting: Pulmonary Disease

## 2023-09-11 ENCOUNTER — Encounter (HOSPITAL_COMMUNITY): Payer: Self-pay

## 2023-09-11 DIAGNOSIS — I132 Hypertensive heart and chronic kidney disease with heart failure and with stage 5 chronic kidney disease, or end stage renal disease: Secondary | ICD-10-CM | POA: Diagnosis present

## 2023-09-11 DIAGNOSIS — I214 Non-ST elevation (NSTEMI) myocardial infarction: Secondary | ICD-10-CM

## 2023-09-11 DIAGNOSIS — I5021 Acute systolic (congestive) heart failure: Secondary | ICD-10-CM | POA: Diagnosis present

## 2023-09-11 DIAGNOSIS — Z66 Do not resuscitate: Secondary | ICD-10-CM | POA: Diagnosis present

## 2023-09-11 DIAGNOSIS — E876 Hypokalemia: Secondary | ICD-10-CM | POA: Diagnosis present

## 2023-09-11 DIAGNOSIS — R188 Other ascites: Secondary | ICD-10-CM | POA: Diagnosis present

## 2023-09-11 DIAGNOSIS — Z8249 Family history of ischemic heart disease and other diseases of the circulatory system: Secondary | ICD-10-CM | POA: Diagnosis not present

## 2023-09-11 DIAGNOSIS — Z85828 Personal history of other malignant neoplasm of skin: Secondary | ICD-10-CM | POA: Diagnosis not present

## 2023-09-11 DIAGNOSIS — I5082 Biventricular heart failure: Secondary | ICD-10-CM | POA: Diagnosis present

## 2023-09-11 DIAGNOSIS — Z992 Dependence on renal dialysis: Secondary | ICD-10-CM | POA: Diagnosis not present

## 2023-09-11 DIAGNOSIS — Z79890 Hormone replacement therapy (postmenopausal): Secondary | ICD-10-CM | POA: Diagnosis not present

## 2023-09-11 DIAGNOSIS — R570 Cardiogenic shock: Secondary | ICD-10-CM | POA: Diagnosis present

## 2023-09-11 DIAGNOSIS — E209 Hypoparathyroidism, unspecified: Secondary | ICD-10-CM | POA: Diagnosis present

## 2023-09-11 DIAGNOSIS — Z87891 Personal history of nicotine dependence: Secondary | ICD-10-CM

## 2023-09-11 DIAGNOSIS — D631 Anemia in chronic kidney disease: Secondary | ICD-10-CM | POA: Diagnosis present

## 2023-09-11 DIAGNOSIS — J9601 Acute respiratory failure with hypoxia: Secondary | ICD-10-CM | POA: Diagnosis present

## 2023-09-11 DIAGNOSIS — I739 Peripheral vascular disease, unspecified: Secondary | ICD-10-CM | POA: Diagnosis present

## 2023-09-11 DIAGNOSIS — N2581 Secondary hyperparathyroidism of renal origin: Secondary | ICD-10-CM | POA: Diagnosis present

## 2023-09-11 DIAGNOSIS — Z79899 Other long term (current) drug therapy: Secondary | ICD-10-CM | POA: Diagnosis not present

## 2023-09-11 DIAGNOSIS — E785 Hyperlipidemia, unspecified: Secondary | ICD-10-CM | POA: Diagnosis present

## 2023-09-11 DIAGNOSIS — R579 Shock, unspecified: Secondary | ICD-10-CM | POA: Diagnosis not present

## 2023-09-11 DIAGNOSIS — Z7902 Long term (current) use of antithrombotics/antiplatelets: Secondary | ICD-10-CM

## 2023-09-11 DIAGNOSIS — Z515 Encounter for palliative care: Secondary | ICD-10-CM | POA: Diagnosis not present

## 2023-09-11 DIAGNOSIS — Z90711 Acquired absence of uterus with remaining cervical stump: Secondary | ICD-10-CM | POA: Diagnosis not present

## 2023-09-11 DIAGNOSIS — E039 Hypothyroidism, unspecified: Secondary | ICD-10-CM | POA: Diagnosis present

## 2023-09-11 DIAGNOSIS — Z823 Family history of stroke: Secondary | ICD-10-CM | POA: Diagnosis not present

## 2023-09-11 DIAGNOSIS — I701 Atherosclerosis of renal artery: Secondary | ICD-10-CM | POA: Diagnosis present

## 2023-09-11 DIAGNOSIS — K219 Gastro-esophageal reflux disease without esophagitis: Secondary | ICD-10-CM | POA: Diagnosis present

## 2023-09-11 DIAGNOSIS — N186 End stage renal disease: Secondary | ICD-10-CM | POA: Diagnosis present

## 2023-09-11 DIAGNOSIS — R54 Age-related physical debility: Secondary | ICD-10-CM | POA: Diagnosis present

## 2023-09-11 DIAGNOSIS — Z7982 Long term (current) use of aspirin: Secondary | ICD-10-CM

## 2023-09-11 LAB — ECHOCARDIOGRAM COMPLETE
AR max vel: 1.3 cm2
AV Area VTI: 1.05 cm2
AV Area mean vel: 1.1 cm2
AV Mean grad: 30.5 mm[Hg]
AV Peak grad: 45 mm[Hg]
Ao pk vel: 3.35 m/s
Area-P 1/2: 4.01 cm2
Calc EF: 45.8 %
Height: 69 in
P 1/2 time: 206 ms
S' Lateral: 2.7 cm
Single Plane A2C EF: 42.2 %
Single Plane A4C EF: 46.4 %
Weight: 2289.26 [oz_av]

## 2023-09-11 LAB — BASIC METABOLIC PANEL
Anion gap: 17 — ABNORMAL HIGH (ref 5–15)
BUN: 16 mg/dL (ref 8–23)
CO2: 23 mmol/L (ref 22–32)
Calcium: 8.2 mg/dL — ABNORMAL LOW (ref 8.9–10.3)
Chloride: 97 mmol/L — ABNORMAL LOW (ref 98–111)
Creatinine, Ser: 4.4 mg/dL — ABNORMAL HIGH (ref 0.44–1.00)
GFR, Estimated: 10 mL/min — ABNORMAL LOW (ref >60)
Glucose, Bld: 121 mg/dL — ABNORMAL HIGH (ref 70–99)
Potassium: 3.4 mmol/L — ABNORMAL LOW (ref 3.5–5.1)
Sodium: 137 mmol/L (ref 135–145)

## 2023-09-11 LAB — BODY FLUID CELL COUNT WITH DIFFERENTIAL
Eos, Fluid: 1 %
Lymphs, Fluid: 64 %
Monocyte-Macrophage-Serous Fluid: 8 % — ABNORMAL LOW (ref 50–90)
Neutrophil Count, Fluid: 26 % — ABNORMAL HIGH (ref 0–25)
Other Cells, Fluid: 1 %
Total Nucleated Cell Count, Fluid: 164 uL (ref 0–1000)

## 2023-09-11 LAB — CBC
HCT: 27.2 % — ABNORMAL LOW (ref 36.0–46.0)
Hemoglobin: 7.9 g/dL — ABNORMAL LOW (ref 12.0–15.0)
MCH: 28.6 pg (ref 26.0–34.0)
MCHC: 29 g/dL — ABNORMAL LOW (ref 30.0–36.0)
MCV: 98.6 fL (ref 80.0–100.0)
Platelets: 304 10*3/uL (ref 150–400)
RBC: 2.76 MIL/uL — ABNORMAL LOW (ref 3.87–5.11)
RDW: 21.1 % — ABNORMAL HIGH (ref 11.5–15.5)
WBC: 16.8 10*3/uL — ABNORMAL HIGH (ref 4.0–10.5)
nRBC: 0.3 % — ABNORMAL HIGH (ref 0.0–0.2)

## 2023-09-11 LAB — TROPONIN I (HIGH SENSITIVITY)
Troponin I (High Sensitivity): 6824 ng/L (ref <18)
Troponin I (High Sensitivity): 7332 ng/L (ref <18)

## 2023-09-11 LAB — HEPATIC FUNCTION PANEL
ALT: 31 U/L (ref 0–44)
AST: 73 U/L — ABNORMAL HIGH (ref 15–41)
Albumin: 1.8 g/dL — ABNORMAL LOW (ref 3.5–5.0)
Alkaline Phosphatase: 132 U/L — ABNORMAL HIGH (ref 38–126)
Bilirubin, Direct: 0.7 mg/dL — ABNORMAL HIGH (ref 0.0–0.2)
Indirect Bilirubin: 0.9 mg/dL (ref 0.3–0.9)
Total Bilirubin: 1.6 mg/dL — ABNORMAL HIGH (ref 0.3–1.2)
Total Protein: 5.9 g/dL — ABNORMAL LOW (ref 6.5–8.1)

## 2023-09-11 LAB — HEPARIN LEVEL (UNFRACTIONATED)
Heparin Unfractionated: 0.4 [IU]/mL (ref 0.30–0.70)
Heparin Unfractionated: 0.33 [IU]/mL (ref 0.30–0.70)

## 2023-09-11 LAB — GLUCOSE, CAPILLARY: Glucose-Capillary: 113 mg/dL — ABNORMAL HIGH (ref 70–99)

## 2023-09-11 LAB — CG4 I-STAT (LACTIC ACID): Lactic Acid, Venous: 1.7 mmol/L (ref 0.5–1.9)

## 2023-09-11 LAB — MAGNESIUM: Magnesium: 1.7 mg/dL (ref 1.7–2.4)

## 2023-09-11 LAB — MRSA NEXT GEN BY PCR, NASAL: MRSA by PCR Next Gen: DETECTED — AB

## 2023-09-11 LAB — APTT: aPTT: 126 s — ABNORMAL HIGH (ref 24–36)

## 2023-09-11 LAB — PHOSPHORUS: Phosphorus: 4.4 mg/dL (ref 2.5–4.6)

## 2023-09-11 MED ORDER — EPINEPHRINE HCL 5 MG/250ML IV SOLN IN NS
0.5000 ug/min | INTRAVENOUS | Status: DC
Start: 2023-09-11 — End: 2023-09-13
  Administered 2023-09-11: 4 ug/min via INTRAVENOUS
  Administered 2023-09-11 – 2023-09-12 (×2): 6 ug/min via INTRAVENOUS
  Filled 2023-09-11 (×3): qty 250

## 2023-09-11 MED ORDER — POLYETHYLENE GLYCOL 3350 17 G PO PACK: 17.0000 g | PACK | Freq: Every day | ORAL | Status: DC | PRN

## 2023-09-11 MED ORDER — ALBUMIN HUMAN 25 % IV SOLN
25.0000 g | Freq: Four times a day (QID) | INTRAVENOUS | Status: AC
  Administered 2023-09-11 – 2023-09-12 (×4): 25 g via INTRAVENOUS
  Filled 2023-09-11 (×4): qty 100

## 2023-09-11 MED ORDER — HEPARIN (PORCINE) 25000 UT/250ML-% IV SOLN
800.0000 [IU]/h | INTRAVENOUS | Status: DC
  Administered 2023-09-11: 800 [IU]/h via INTRAVENOUS

## 2023-09-11 MED ORDER — HEPARIN (PORCINE) 25000 UT/250ML-% IV SOLN
900.0000 [IU]/h | INTRAVENOUS | Status: DC
  Administered 2023-09-11: 800 [IU]/h via INTRAVENOUS

## 2023-09-11 MED ORDER — DOCUSATE SODIUM 100 MG PO CAPS
100.0000 mg | ORAL_CAPSULE | Freq: Two times a day (BID) | ORAL | Status: DC | PRN
Start: 2023-09-11 — End: 2023-09-12

## 2023-09-11 MED ORDER — MUPIROCIN 2 % EX OINT
1.0000 | TOPICAL_OINTMENT | Freq: Two times a day (BID) | CUTANEOUS | Status: DC
  Administered 2023-09-12 (×2): 1 via NASAL
  Filled 2023-09-11: qty 22

## 2023-09-11 MED ORDER — POTASSIUM CHLORIDE CRYS ER 20 MEQ PO TBCR
40.0000 meq | EXTENDED_RELEASE_TABLET | Freq: Once | ORAL | Status: AC
  Administered 2023-09-11: 40 meq via ORAL
  Filled 2023-09-11: qty 2

## 2023-09-11 MED ORDER — CHLORHEXIDINE GLUCONATE CLOTH 2 % EX PADS
6.0000 | MEDICATED_PAD | Freq: Every day | CUTANEOUS | Status: DC
Start: 2023-09-11 — End: 2023-09-13
  Administered 2023-09-11 – 2023-09-12 (×2): 6 via TOPICAL

## 2023-09-11 MED ORDER — ASPIRIN 81 MG PO TBEC
81.0000 mg | DELAYED_RELEASE_TABLET | Freq: Every day | ORAL | Status: DC
  Administered 2023-09-11 – 2023-09-12 (×2): 81 mg via ORAL
  Filled 2023-09-11 (×2): qty 1

## 2023-09-11 MED ORDER — CHLORHEXIDINE GLUCONATE CLOTH 2 % EX PADS
6.0000 | MEDICATED_PAD | Freq: Every day | CUTANEOUS | Status: DC
  Administered 2023-09-12: 6 via TOPICAL

## 2023-09-11 MED ORDER — CALCIUM ACETATE (PHOS BINDER) 667 MG PO CAPS
1334.0000 mg | ORAL_CAPSULE | Freq: Three times a day (TID) | ORAL | Status: DC
  Administered 2023-09-11 – 2023-09-12 (×3): 1334 mg via ORAL
  Filled 2023-09-11 (×3): qty 2

## 2023-09-11 MED ORDER — ATORVASTATIN CALCIUM 80 MG PO TABS
80.0000 mg | ORAL_TABLET | Freq: Every day | ORAL | Status: DC
  Administered 2023-09-11 – 2023-09-12 (×2): 80 mg via ORAL
  Filled 2023-09-11 (×2): qty 1

## 2023-09-11 MED ORDER — SODIUM CHLORIDE 0.9 % IV SOLN
2.0000 g | INTRAVENOUS | Status: DC
  Administered 2023-09-11: 2 g via INTRAVENOUS
  Filled 2023-09-11: qty 20

## 2023-09-11 MED ORDER — CALCITRIOL 0.25 MCG PO CAPS: 0.7500 ug | ORAL_CAPSULE | ORAL | Status: DC

## 2023-09-11 MED ORDER — LEVOTHYROXINE SODIUM 75 MCG PO TABS
75.0000 ug | ORAL_TABLET | Freq: Every day | ORAL | Status: DC
Start: 2023-09-11 — End: 2023-09-12
  Administered 2023-09-11 – 2023-09-12 (×2): 75 ug via ORAL
  Filled 2023-09-11 (×2): qty 1

## 2023-09-11 MED ORDER — OXIDIZED CELLULOSE EX PADS
1.0000 | MEDICATED_PAD | Freq: Once | CUTANEOUS | Status: DC
  Filled 2023-09-11: qty 1

## 2023-09-11 MED ORDER — CLOPIDOGREL BISULFATE 75 MG PO TABS
75.0000 mg | ORAL_TABLET | Freq: Every day | ORAL | Status: DC
  Administered 2023-09-11 – 2023-09-12 (×2): 75 mg via ORAL
  Filled 2023-09-11 (×2): qty 1

## 2023-09-11 MED ORDER — FAMOTIDINE 20 MG PO TABS
20.0000 mg | ORAL_TABLET | Freq: Two times a day (BID) | ORAL | Status: DC
  Administered 2023-09-11 – 2023-09-12 (×3): 20 mg via ORAL
  Filled 2023-09-11 (×3): qty 1

## 2023-09-11 MED ORDER — ORAL CARE MOUTH RINSE: 15.0000 mL | OROMUCOSAL | Status: DC | PRN

## 2023-09-11 NOTE — H&P (Signed)
NAME:  Latoya Lopez, MRN:  [id number], DOB:  [birth year], LOS: 0 ADMISSION DATE:  08/24/2023, CONSULTATION DATE:  09/12/2023 REFERRING MD:  [facility] CHIEF COMPLAINT:  L groin pain   History of Present Illness:  Latoya Lopez is a 82 y.o. female who has a PMH as below. She presented to [facility] ED 10/29 with left groin pain after she had a aortogram with LLE angiogram and balloon angioplasty of L superficial femoral artery 1 week prior (09/04/23) for critical bilateral limb ischemia. She has had no external bleeding or bruising following the procedure.  On EMS arrival, she was hypoxic to the 80s with improvement to 90s after being placed on 4L O2. Workup in ED suggestive of NSTEMI and presumed cardiogenic shock. She was started on Heparin. She remained hypotensive after IVF's and was started on Epinephrine infusion. She had a right femoral CVL placed at [facility].  She was later transferred to [facility] for ongoing workup and management.  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); Fever; Anemia of chronic disease; Hyperlipidemia; Acute respiratory failure with hypoxia (HCC); Sepsis without acute organ dysfunction (HCC); Acute on chronic diastolic CHF (congestive heart failure) (HCC); Aortic valve stenosis; Atherosclerosis of native coronary artery of native heart without angina pectoris; Benign hypertension with chronic kidney disease, stage V (HCC); Disorder of phosphorus metabolism, unspecified; ESRD on hemodialysis (HCC); Former smoker; Gastroesophageal reflux disease without esophagitis; Heart failure, unspecified (HCC); History of Mohs micrographic surgery for skin cancer; History of tobacco abuse; Hypercalcemia; and NSTEMI (non-ST elevated myocardial infarction) (HCC) on their problem  list.  Significant Hospital Events: Including procedures, antibiotic start and stop dates in addition to other pertinent events   10/30 admit  Interim History / Subjective:  Comfortable on 4L O2. Currently denies any chest pain, dyspnea.  Objective:  Blood pressure (!) 119/95, pulse 98, resp. rate (!) 23, SpO2 91%.       No intake or output data in the 24 hours ending 09/08/2023 0549 There were no vitals filed for this visit.  Examination: General: Elderly female, chronically ill appearing, resting in bed, in NAD. Neuro: A&O x 3, no deficits. HEENT: [place]/AT. Sclerae anicteric. EOMI. Cardiovascular: RRR, 4/6 holosystolic murmur.  Lungs: Respirations even and unlabored.  CTA bilaterally, No W/R/R. Abdomen: Abdomen distended with + fluid wave. BS x 4, soft, NT.  Musculoskeletal: No gross deformities, no edema.  Skin: Intact, warm, no rashes.  Labs/imaging personally reviewed:  CXR 10/30 > CM, vascular congestion. CTA aorta 10/30 > mod to severe stenosis of celiac artery with probable small dissection flap in proximal common hepatic artery, sever narrowing of bilateral renal arteries, hepatic steatosis suggestive of cirrhosis, large volume ascites, cholelithiasis, R>L pleural effusions.  Assessment & Plan:   NSTEMI. Presumed cardiogenic shock. Hx CHF, HTN, HLD. - Continue heparin infusion. - Repeat EKG, troponin. - Assess echo. - Continue Epinephrine infusion, goal MAP > 65. - Has femoral CVL so unfortunately no CVP or co-ox's currently. Can consider changing lines depending on her course over the next few hours. - Continue PTA Atorvastatin, ASA, Clopidogrel - Hold PTA Amlodipine, Carvedilol.  Acute hypoxic respiratory failure. Acute pulmonary edema. Pleural effusions R > L. - Continue supplemental O2 as needed to maintain SpO2 > 92%. - Defer diuresis as she is anuric. She will need volume removal via HD vs CRRT. - Mobilize as able.  Probable small dissection flap of  proximal common hepatic artery. - Day team to please consult vascular surgery.  Bilateral critical limb ischemia - s/p aortogram with LLE angiogram and balloon angioplasty of L superficial femoral artery 09/04/23. - Supportive care.  Hypokalemia. ESRD on HD TTS - last full session Tues 10/29. - 40 mEq K x 1. - Day team to please consult nephrology. - Follow BMP.  Chronic ascites - last paracentesis September 2024. - Day team to evaluate for diagnostic and therapeutic paracentesis. - Volume removal per HD vs CRRT.  AoC Anemia. - Transfuse for Hgb < 7.  Hx Hypothyroidism. - Continue PTA Synthroid.   Best practice (evaluated daily):  Diet/type: NPO DVT prophylaxis: systemic heparin GI prophylaxis: H2B Lines: Central line Foley:  N/A Code Status:  full code Last date of multidisciplinary goals of care discussion: None yet.  Labs   CBC: Recent Labs  Lab 09/04/23 0802  HGB 9.5*  HCT 28.0*    Basic Metabolic Panel: Recent Labs  Lab 09/04/23 0802  NA 137  K 4.2  CL 95*  GLUCOSE 79  BUN 28*  CREATININE 4.40*   GFR: Estimated Creatinine Clearance: 10.6 mL/min (A) (by C-G formula based on SCr of 4.4 mg/dL (H)). No results for input(s): "PROCALCITON", "WBC", "LATICACIDVEN" in the last 168 hours.  Liver Function Tests: No results for input(s): "AST", "ALT", "ALKPHOS", "BILITOT", "PROT", "ALBUMIN" in the last 168 hours. No results for input(s): "LIPASE", "AMYLASE" in the last 168 hours. No results for input(s): "AMMONIA" in the last 168 hours.  ABG    Component Value Date/Time   TCO2 33 (H) 09/04/2023 0802     Coagulation Profile: No results for input(s): "INR", "PROTIME" in the last 168 hours.  Cardiac Enzymes: No results for input(s): "CKTOTAL", "CKMB", "CKMBINDEX", "TROPONINI" in the last 168 hours.  HbA1C: No results found for: "HGBA1C"  CBG: No results for input(s): "GLUCAP" in the last 168 hours.  Review of Systems:   All negative; except for  those that are bolded, which indicate positives.  Constitutional: weight loss, weight gain, night sweats, fevers, chills, fatigue, weakness.  HEENT: headaches, sore throat, sneezing, nasal congestion, post nasal drip, difficulty swallowing, tooth/dental problems, visual complaints, visual changes, ear aches. Neuro: difficulty with speech, weakness, numbness, ataxia. CV:  chest pain, orthopnea, PND, swelling in lower extremities, dizziness, palpitations, syncope.  Resp: cough, hemoptysis, dyspnea, wheezing. GI: heartburn, indigestion, abdominal pain, nausea, vomiting, diarrhea, constipation, change in bowel habits, loss of appetite, hematemesis, melena, hematochezia.  GU: dysuria, change in color of urine, urgency or frequency, flank pain, hematuria. MSK: joint pain or swelling, decreased range of motion, L groin pain at incisional site from 1 angiogram 1 week prior. Psych: change in mood or affect, depression, anxiety, suicidal ideations, homicidal ideations. Skin: rash, itching, bruising.   Past Medical History:  She,  has a past medical history of Anemia of renal disease, Arthritis, Cancer (HCC), Dyspnea, ESRD (end stage renal disease) (HCC), GERD (gastroesophageal reflux disease), Heart murmur, Hyperkalemia, Hyperlipidemia, Hyperphosphatemia, Hypertension, Hypokalemia, Hypoparathyroidism (HCC), Hypothyroidism, Membranous glomerulonephritis, Proteinuria, S/P partial hysterectomy, Seasonal allergies, Secondary hyperparathyroidism, renal (HCC), and Wears glasses.   Surgical History:   Past Surgical History:  Procedure Laterality Date   A/V FISTULAGRAM N/A 02/10/2020   Procedure: A/V FISTULAGRAM - Left Arm;  Surgeon: Cephus Shelling, MD;  Location[facility] INVASIVE CV LAB;  Service: Cardiovascular;  Laterality: N/A;   ABDOMINAL AORTOGRAM W/LOWER EXTREMITY N/A 09/04/2023   Procedure: ABDOMINAL AORTOGRAM W/LOWER EXTREMITY;  Surgeon: Gerarda Fraction  E, MD;  Location: [facility] INVASIVE CV LAB;  Service:  Cardiovascular;  Laterality: N/A;   ABDOMINAL HYSTERECTOMY     PARTIAL   AV FISTULA PLACEMENT Right 08/06/2017   Procedure: ARTERIOVENOUS (AV) FISTULA CREATION RIGHT ARM;  Surgeon: Sherren Kerns, MD;  Location: [facility] OR;  Service: Vascular;  Laterality: Right;   AV FISTULA PLACEMENT Right 08/20/2017   Procedure: INSERTION OF ARTERIOVENOUS (AV) GORE-TEX STRETCH GRAFT INTO RIGHT ARM;  Surgeon: Sherren Kerns, MD;  Location: [facility] OR;  Service: Vascular;  Laterality: Right;   AV FISTULA PLACEMENT Left 05/29/2019   Procedure: ARTERIOVENOUS (AV) FISTULA CREATION LEFT ARM;  Surgeon: Nada Libman, MD;  Location: [facility] OR;  Service: Vascular;  Laterality: Left;   AV FISTULA PLACEMENT Left 12/23/2019   Procedure: INSERTION OF LEFT ARM ARTERIOVENOUS (AV) artegraft;  Surgeon: Nada Libman, MD;  Location: [facility] OR;  Service: Vascular;  Laterality: Left;   BASCILIC VEIN TRANSPOSITION Left 07/22/2019   Procedure: SECOND STAGE BASILIC VEIN TRANSPOSITION LEFT ARM;  Surgeon: Nada Libman, MD;  Location: [facility] OR;  Service: Vascular;  Laterality: Left;   COLONOSCOPY     INSERTION OF DIALYSIS CATHETER N/A 08/17/2017   Procedure: INSERTION OF DIALYSIS CATHETER;  Surgeon: Larina Earthly, MD;  Location: [facility] OR;  Service: Vascular;  Laterality: N/A;   INSERTION OF DIALYSIS CATHETER Right 09/21/2018   Procedure: INSERTION OF DIALYSIS CATHETER;  Surgeon: Cephus Shelling, MD;  Location: [facility] OR;  Service: Vascular;  Laterality: Right;   PERIPHERAL VASCULAR INTERVENTION  09/04/2023   Procedure: PERIPHERAL VASCULAR INTERVENTION;  Surgeon: Victorino Sparrow, MD;  Location: [facility] INVASIVE CV LAB;  Service: Cardiovascular;;   REVISION OF ARTERIOVENOUS GORETEX GRAFT Right 07/07/2018   Procedure: REVISION OF ARTERIOVENOUS GORETEX GRAFT;  Surgeon: Sherren Kerns, MD;  Location: [facility] OR;  Service: Vascular;  Laterality: Right;   SKIN BIOPSY      SKIN CANCER 6 AREAS   THROMBECTOMY AND REVISION OF ARTERIOVENTOUS (AV) GORETEX  GRAFT  Right 09/17/2018   Procedure: THROMBECTOMY AND REVISION OF ARTERIOVENTOUS (AV) GORETEX  GRAFT;  Surgeon: Nada Libman, MD;  Location: [facility] OR;  Service: Vascular;  Laterality: Right;   TUBUALIGATION       Social History:   reports that she quit smoking about 27 years ago. Her smoking use included cigarettes. She has never used smokeless tobacco. She reports that she does not drink alcohol and does not use drugs.   Family History:  Her family history includes Heart attack in her father and sister; Stroke in her mother.   Allergies No Known Allergies   Home Medications  Prior to Admission medications   Medication Sig Start Date End Date Taking? Authorizing Provider  acetaminophen (TYLENOL) 500 MG tablet Take 500 mg by mouth every 6 (six) hours as needed for mild pain or headache.    [provider]  amLODipine (NORVASC) 5 MG tablet Take 5 mg by mouth at bedtime. 05/11/19   [provider]  aspirin EC 81 MG tablet Take 1 tablet (81 mg total) by mouth daily. Swallow whole. 09/04/23 09/03/24  Victorino Sparrow, MD  atorvastatin (LIPITOR) 80 MG tablet Take 80 mg by mouth daily with breakfast.     [provider]  calcium acetate (PHOSLO) 667 MG capsule Take 2 capsules (1,334 mg total) by mouth 3 (three) times daily with meals. Patient taking differently: Take 1,334 mg by mouth 2 (two) times daily with a meal. 08/21/17   Maxie Barb, MD  carvedilol (COREG) 25 MG tablet Take 25 mg by mouth See admin instructions. Take 1 tablet (25 mg) by mouth in the evening ONLY on Tuesdays, Thursdays, & Saturdays. Take 1 tablet (25 mg) by mouth twice daily on Sundays, Mondays, Wednesdays, & Fridays.    [provider]  clopidogrel (PLAVIX) 75 MG tablet Take 1 tablet (75 mg total) by mouth daily. 09/04/23 09/03/24  Victorino Sparrow, MD  famotidine (PEPCID) 20 MG tablet Take 20 mg by mouth 2 (two) times daily.    [provider]  gabapentin (NEURONTIN) 100 MG  capsule Take 100 mg by mouth at bedtime. 05/04/20   [provider]  leptospermum manuka honey (MEDIHONEY) PSTE paste Apply 1 Application topically daily. Apply to wounds and cover with gauze once daily 06/05/23   McCaughan, Dia D, DPM  levothyroxine (SYNTHROID, LEVOTHROID) 75 MCG tablet Take 75 mcg by mouth daily before breakfast.    [provider]  lidocaine-prilocaine (EMLA) cream Apply 1 Application topically as needed. 06/05/23   McCaughan, Dia D, DPM  loperamide (IMODIUM A-D) 2 MG tablet Take 2 mg by mouth daily as needed (Diarrhea). 08/27/23 08/25/24  [provider]  multivitamin (RENA-VIT) TABS tablet Take 1 tablet by mouth daily.    [provider]  omeprazole (PRILOSEC) 20 MG capsule Take 20 mg by mouth daily.    [provider]  pentoxifylline (TRENTAL) 400 MG CR tablet Take 1 tablet (400 mg total) by mouth daily. On your dialysis days, take your tablet after dialysis. Patient taking differently: Take 400 mg by mouth at bedtime. 06/22/23 09/20/23  McCaughan, Dia D, DPM  raloxifene (EVISTA) 60 MG tablet Take 60 mg by mouth daily with breakfast.     [provider]     Critical care time: 40 min.   Rutherford Guys, PA - [facility] For pager details, please see AMION or use Epic chat  After 1900, please call [facility] for cross coverage needs 08/17/2023, 5:49 AM

## 2023-09-11 NOTE — Progress Notes (Signed)
PHARMACY - ANTICOAGULATION CONSULT NOTE  Pharmacy Consult for heparin Indication: chest pain/ACS  No Known Allergies  Patient Measurements: Height: 5\' 9"  (175.3 cm) Weight: 64.9 kg (143 lb 1.3 oz) IBW/kg (Calculated) : 66.2 Heparin Dosing Weight: 65.8 kg  Vital Signs: Temp: 99 F (37.2 C) (10/30 2012) Temp Source: Oral (10/30 2012) BP: 92/50 (10/30 2200) Pulse Rate: 90 (10/30 2200)  Labs:  Estimated Creatinine Clearance: 10.4 mL/min (A) (by C-G formula based on SCr of 4.4 mg/dL (H)).   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   Dyspnea    when climbing stairs   ESRD (end stage renal disease) (HCC)    hemodialysis T-Th-S  at [facility]   GERD (gastroesophageal reflux disease)    Heart murmur    "Nothing to be concerned about, the heart Dr said."   Hyperkalemia    Hyperlipidemia    Hyperphosphatemia    Hypertension    Essential with goal blood pressure less than 130/80, 12/22/2019-    Hypokalemia    Hypoparathyroidism (HCC)    Hypothyroidism    Membranous glomerulonephritis    Proteinuria    S/P partial hysterectomy    Seasonal allergies    Secondary hyperparathyroidism, renal (HCC)    Wears glasses    Assessment: 22 yoF with history of bilat lower extremity pain s/p aortogram w/ LLE angiogram and balloon angioplasty of L. Superficial artery presented to [facility] ED with right groin pain, transferred to [facility]. Pharmacy consulted to dose heparin for ACS.  Heparin level this morning is within goal range on 800 units/hr.   Heparin off since ~1400 for paracentesis. Okay to restart heparin now per cardiology. Noted cath deferred and treating medically for now.  Goal of Therapy:  Heparin level 0.3-0.7 units/ml Monitor platelets by anticoagulation protocol: Yes   Plan:  Restart heparin 800 units/hr Will f/u heparin level 8 hours  post restart  Christoper Fabian, PharmD, BCPS Please see amion for complete clinical pharmacist phone list  09/11/2023 10:24 PM

## 2023-09-11 NOTE — Plan of Care (Signed)

## 2023-09-11 NOTE — Consult Note (Signed)
Advanced Heart Failure Team Consult Note   Primary Physician: Patient, No Pcp Per PCP-Cardiologist:  None  Reason for Consultation: RV Failure/Cardiogenic Shock   HPI:    Latoya Lopez is seen today for evaluation of RV Failure/Cardiogenic Shock at the request of Dr Merrily Pew.   Latoya Lopez is a 82 year old with history of ESRD (HD Tue/Thr/Sat), HTN, hypothyroidism, anemia, HLD,  ascites, and PAD. Had Paracentesis Spetmeber 2024. Followed by [facility] and recently having bilateral lower extremity pain. ABI with mild-mod disease demonstrating tibial disease.  Had an outpatient angiogram with ballon angioplasty on 09/04/23.   Presented to [facility] with left groin pain. On arrival hypoxic and hypotensive. Transferred to [facility] on epi drip and given IV fluids.  Placed on oxygen and given IV fluids. CXR with vascular congestion.  HS trop [id number]. EKG without ST elevation. Started on heparin drip.   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 Severe TR. RV severely reduced. LV 45-50%. Mod -Severe AS. Aortic Valve mean gradient 30.5 mmhg.    Home Medications Prior to Admission medications   Medication Sig Start Date End Date Taking? Authorizing Provider  acetaminophen (TYLENOL) 500 MG tablet Take 500 mg by mouth every 6 (six) hours as needed for mild pain or headache.   Yes [provider]  amLODipine (NORVASC) 5 MG tablet Take 5 mg by mouth at bedtime. 05/11/19  Yes [provider]  aspirin EC 81 MG tablet Take 1 tablet (81 mg total) by mouth daily. Swallow whole. 09/04/23 09/03/24 Yes Victorino Sparrow, MD  atorvastatin (LIPITOR) 80 MG tablet Take 80 mg by mouth daily with breakfast.    Yes [provider]  calcium  acetate (PHOSLO) 667 MG capsule Take 2 capsules (1,334 mg total) by mouth 3 (three) times daily with meals. Patient taking differently: Take 1,334 mg by mouth 2 (two) times daily with a meal. 1 in the Morning, and 1 at Bedtime 08/21/17  Yes Maxie Barb, MD  carvedilol (COREG) 25 MG tablet Take 25 mg by mouth See admin instructions. Take 1 tablet (25 mg) by mouth in the evening ONLY on Tuesdays, Thursdays, & Saturdays. Take 1 tablet (25 mg) by mouth twice daily on Sundays, Mondays, Wednesdays, & Fridays.   Yes [provider]  clopidogrel (PLAVIX) 75 MG tablet Take 1 tablet (75 mg total) by mouth daily. 09/04/23 09/03/24 Yes Victorino Sparrow, MD  famotidine (PEPCID) 20 MG tablet Take 20 mg by mouth 2 (two) times daily.   Yes [provider]  gabapentin (NEURONTIN) 100 MG capsule Take 100 mg by mouth at bedtime. 05/04/20  Yes [provider]  levothyroxine (SYNTHROID, LEVOTHROID) 75 MCG tablet Take 75 mcg by mouth daily before breakfast.   Yes [provider]  lidocaine-prilocaine (EMLA) cream Apply 1 Application topically as needed. 06/05/23  Yes McCaughan, Dia D, DPM  loperamide (IMODIUM A-D) 2 MG tablet Take 2 mg by mouth daily as needed (Diarrhea). 08/27/23 08/25/24 Yes [provider]  omeprazole (PRILOSEC) 20 MG capsule Take 20 mg by mouth daily.   Yes [provider]  pentoxifylline (TRENTAL) 400 MG CR tablet Take 1 tablet (400 mg total) by mouth daily. On your dialysis days, take your tablet after dialysis. Patient taking differently: Take 400  mg by mouth at bedtime. 06/22/23 09/20/23 Yes McCaughan, Dia D, DPM  raloxifene (EVISTA) 60 MG tablet Take 60 mg by mouth daily with breakfast.    Yes [provider]    Past 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   Dyspnea    when climbing stairs   ESRD  (end stage renal disease) (HCC)    hemodialysis T-Th-S  at [facility]   GERD (gastroesophageal reflux disease)    Heart murmur    "Nothing to be concerned about, the heart Dr said."   Hyperkalemia    Hyperlipidemia    Hyperphosphatemia    Hypertension    Essential with goal blood pressure less than 130/80, 12/22/2019-    Hypokalemia    Hypoparathyroidism (HCC)    Hypothyroidism    Membranous glomerulonephritis    Proteinuria    S/P partial hysterectomy    Seasonal allergies    Secondary hyperparathyroidism, renal (HCC)    Wears glasses     Past Surgical History: Past Surgical History:  Procedure Laterality Date   A/V FISTULAGRAM N/A 02/10/2020   Procedure: A/V FISTULAGRAM - Left Arm;  Surgeon: Cephus Shelling, MD;  Location: [facility] INVASIVE CV LAB;  Service: Cardiovascular;  Laterality: N/A;   ABDOMINAL AORTOGRAM W/LOWER EXTREMITY N/A 09/04/2023   Procedure: ABDOMINAL AORTOGRAM W/LOWER EXTREMITY;  Surgeon: Victorino Sparrow, MD;  Location: [facility] INVASIVE CV LAB;  Service: Cardiovascular;  Laterality: N/A;   ABDOMINAL HYSTERECTOMY     PARTIAL   AV FISTULA PLACEMENT Right 08/06/2017   Procedure: ARTERIOVENOUS (AV) FISTULA CREATION RIGHT ARM;  Surgeon: Sherren Kerns, MD;  Location: [facility] OR;  Service: Vascular;  Laterality: Right;   AV FISTULA PLACEMENT Right 08/20/2017   Procedure: INSERTION OF ARTERIOVENOUS (AV) GORE-TEX STRETCH GRAFT INTO RIGHT ARM;  Surgeon: Sherren Kerns, MD;  Location: [facility] OR;  Service: Vascular;  Laterality: Right;   AV FISTULA PLACEMENT Left 05/29/2019   Procedure: ARTERIOVENOUS (AV) FISTULA CREATION LEFT ARM;  Surgeon: Nada Libman, MD;  Location: [facility] OR;  Service: Vascular;  Laterality: Left;   AV FISTULA PLACEMENT Left 12/23/2019   Procedure: INSERTION OF LEFT ARM ARTERIOVENOUS (AV) artegraft;  Surgeon: Nada Libman, MD;  Location: [facility] OR;  Service: Vascular;  Laterality: Left;   BASCILIC VEIN TRANSPOSITION Left 07/22/2019   Procedure: SECOND  STAGE BASILIC VEIN TRANSPOSITION LEFT ARM;  Surgeon: Nada Libman, MD;  Location: [facility] OR;  Service: Vascular;  Laterality: Left;   COLONOSCOPY     INSERTION OF DIALYSIS CATHETER N/A 08/17/2017   Procedure: INSERTION OF DIALYSIS CATHETER;  Surgeon: Larina Earthly, MD;  Location: [facility] OR;  Service: Vascular;  Laterality: N/A;   INSERTION OF DIALYSIS CATHETER Right 09/21/2018   Procedure: INSERTION OF DIALYSIS CATHETER;  Surgeon: Cephus Shelling, MD;  Location: [facility] OR;  Service: Vascular;  Laterality: Right;   PERIPHERAL VASCULAR INTERVENTION  09/04/2023   Procedure: PERIPHERAL VASCULAR INTERVENTION;  Surgeon: Victorino Sparrow, MD;  Location: [facility] INVASIVE CV LAB;  Service: Cardiovascular;;   REVISION OF ARTERIOVENOUS GORETEX GRAFT Right 07/07/2018   Procedure: REVISION OF ARTERIOVENOUS GORETEX GRAFT;  Surgeon: Sherren Kerns, MD;  Location: [facility] OR;  Service: Vascular;  Laterality: Right;   SKIN BIOPSY      SKIN CANCER 6 AREAS   THROMBECTOMY AND REVISION OF ARTERIOVENTOUS (AV) GORETEX  GRAFT Right 09/17/2018   Procedure: THROMBECTOMY AND REVISION OF ARTERIOVENTOUS (AV) GORETEX  GRAFT;  Surgeon: Nada Libman, MD;  Location: [facility] OR;  Service: Vascular;  Laterality: Right;   TUBUALIGATION      Family History: Family History  Problem Relation Age of Onset   Stroke Mother    Heart attack Father    Heart attack Sister     Social History: Social History   Socioeconomic History   Marital status: Married    Spouse name: Not on file   Number of children: Not on file   Years of education: Not on file   Highest education level: Not on file  Occupational History   Not on file  Tobacco Use   Smoking status: Former    Current packs/day: 0.00    Types: Cigarettes    Quit date: 1997    Years since quitting: 27.8   Smokeless tobacco: Never   Tobacco comments:    quit smoking cigarettes in 1997  Vaping Use   Vaping status: Never Used  Substance and Sexual Activity   Alcohol use: No    Drug use: No   Sexual activity: Not on file  Other Topics Concern   Not on file  Social History Narrative   Not on file   Social Determinants of Health   Financial Resource Strain: Low Risk  (02/05/2022)   Received from [facility], [facility]   Overall Financial Resource Strain (CARDIA)    Difficulty of Paying Living Expenses: Not hard at all  Food Insecurity: Not on file  Transportation Needs: No Transportation Needs (02/09/2022)   Received from [facility], [facility]   PRAPARE - Transportation    Lack of Transportation (Medical): No    Lack of Transportation (Non-Medical): No  Physical Activity: Not on file  Stress: No Stress Concern Present (02/03/2022)   Received from [facility], [facility] of Occupational Health - Occupational Stress Questionnaire    Feeling of Stress : Not at all  Social Connections: Unknown (03/27/2022)   Received from [facility], [facility]   Social Network    Social Network: Not on file    Allergies:  No Known Allergies  Objective:    Vital Signs:   Temp:  [98.1 F (36.7 C)] 98.1 F (36.7 C) September 14, 2023 0730) Pulse Rate:  [92-98] 93 2023-09-14 1100) Resp:  [7-25] 19 14-Sep-2023 1100) BP: (81-119)/(53-95) 95/59 14-Sep-2023 1100) SpO2:  [91 %-100 %] 100 % 14-Sep-2023 1100) Weight:  [64.9 kg-65.8 kg] 64.9 kg Sep 14, 2023 0630) Last BM Date :  (PTA)  Weight change: Filed Weights   2023-09-14 0548 2023/09/14 0630  Weight: 65.8 kg 64.9 kg    Intake/Output:  No intake or output data in the 24 hours ending 2023/09/14 1208    Physical Exam    General:  . No resp difficulty HEENT: normal Neck: supple. JVP 11-12 . Carotids 2+ bilat; no bruits. No lymphadenopathy or thyromegaly appreciated. Cor: PMI nondisplaced. Regular rate & rhythm. No rubs, gallops or murmurs. Lungs: clear on 4 liters  Abdomen: soft, tender, nondistended. No hepatosplenomegaly. No bruits or masses. Good bowel sounds. Extremities: no cyanosis, clubbing, rash, edema.  Lower extremities cool.  Neuro: alert & orientedx3, cranial nerves grossly intact. moves all 4 extremities w/o difficulty. Affect pleasant   Telemetry   SR  EKG    SR   Labs   Basic Metabolic Panel: Recent Labs  Lab 2023/09/14 0531  NA 137  K 3.4*  CL  97*  CO2 23  GLUCOSE 121*  BUN 16  CREATININE 4.40*  CALCIUM 8.2*  MG 1.7    Liver Function Tests: No results for input(s): "AST", "ALT", "ALKPHOS", "BILITOT", "PROT", "ALBUMIN" in the last 168 hours. No results for input(s): "LIPASE", "AMYLASE" in the last 168 hours. No results for input(s): "AMMONIA" in the last 168 hours.  CBC: Recent Labs  Lab 09-24-2023 0531  WBC 16.8*  HGB 7.9*  HCT 27.2*  MCV 98.6  PLT 304    Cardiac Enzymes: No results for input(s): "CKTOTAL", "CKMB", "CKMBINDEX", "TROPONINI" in the last 168 hours.  BNP: BNP (last 3 results) No results for input(s): "BNP" in the last 8760 hours.  ProBNP (last 3 results) No results for input(s): "PROBNP" in the last 8760 hours.   CBG: Recent Labs  Lab 2023/09/24 0624  GLUCAP 113*    Coagulation Studies: No results for input(s): "LABPROT", "INR" in the last 72 hours.   Imaging   ECHOCARDIOGRAM COMPLETE  Result Date: September 24, 2023    ECHOCARDIOGRAM REPORT   Patient Name:   Latoya Lopez Date of Exam: 09/24/23 Medical Rec #:  [id number]        Height:       69.0 in Accession #:    [id number]       Weight:       143.1 lb Date of Birth:  05-25-1941        BSA:          1.792 m Patient Age:    82 years         BP:           105/60 mmHg Patient Gender: F                HR:           94 bpm. Exam Location:  Inpatient Procedure: 2D Echo, 3D Echo, Cardiac Doppler and Color Doppler Indications:    122-I22.9 Subsequent ST elevation (STEM) and non-ST elevation                 (NSTEMI) myocardial infarction  History:        Patient has no prior history of Echocardiogram examinations.                 CHF, Previous Myocardial Infarction, Aortic Valve Disease;  Risk                 Factors:Dyslipidemia and Former Smoker. ESRD. Aortic stenosis.  Sonographer:    Sheralyn Boatman RDCS Referring Phys: [id number] RAHUL P DESAI IMPRESSIONS  1. RV apex and mid wall are akinetic. Mid and apical septum are hypokinetic. Findings could represent acute RV infarction, and the mid to apical inferoseptum are hypokinetic in the LV. There is severe TR with incomplete leaflet coaptation, but this seems to be related to teathered septal leaflet which is due to the regional RV WMA since the TV annulus is not dilated. Right ventricular systolic function is severely reduced. The right ventricular size is severely enlarged. There is moderately elevated pulmonary artery systolic pressure. The estimated right ventricular systolic pressure is 46.6 mmHg.  2. Left ventricular ejection fraction, by estimation, is 45 to 50%. The left ventricle has mildly decreased function. The left ventricle demonstrates regional wall motion abnormalities (see scoring diagram/findings for description). There is mild concentric left ventricular hypertrophy. Left ventricular diastolic parameters are consistent with Grade II diastolic dysfunction (pseudonormalization).  3. Left atrial size was mildly dilated.  4. Right atrial size  was severely dilated.  5. A small pericardial effusion is present. The pericardial effusion is posterior to the left ventricle.  6. The mitral valve is degenerative. Moderate mitral valve regurgitation. Moderate to severe mitral annular calcification.  7. The tricuspid valve is abnormal. Tricuspid valve regurgitation is severe.  8. Moderate to severe AS is present. Vmax 3.3 m/s, MG 30 mmHG, AVA 1.05 cm2. AoV CW signal is rounded suggestive of severe AS. Moderate AI is present which makes AVA by VTI problematic. Suspect moderate to severe AS is present with moderate AI, combined  severe aortic valvular heart disease is present. The aortic valve is tricuspid. There is moderate calcification of the aortic  valve. There is moderate thickening of the aortic valve. Aortic valve regurgitation is moderate. Moderate to severe aortic valve stenosis. Aortic valve area, by VTI measures 1.05 cm. Aortic valve mean gradient measures 30.5 mmHg. Aortic valve Vmax measures 3.35 m/s.  9. The inferior vena cava is dilated in size with <50% respiratory variability, suggesting right atrial pressure of 15 mmHg. FINDINGS  Left Ventricle: Left ventricular ejection fraction, by estimation, is 45 to 50%. The left ventricle has mildly decreased function. The left ventricle demonstrates regional wall motion abnormalities. The left ventricular internal cavity size was normal in size. There is mild concentric left ventricular hypertrophy. Left ventricular diastolic parameters are consistent with Grade II diastolic dysfunction (pseudonormalization).  LV Wall Scoring: The apical septal segment is hypokinetic. Right Ventricle: RV apex and mid wall are akinetic. Mid and apical septum are hypokinetic. Findings could represent acute RV infarction, and the mid to apical inferoseptum are hypokinetic in the LV. There is severe TR with incomplete leaflet coaptation, but this seems to be related to teathered septal leaflet which is due to the regional RV WMA since the TV annulus is not dilated. The right ventricular size is severely enlarged. No increase in right ventricular wall thickness. Right ventricular systolic  function is severely reduced. There is moderately elevated pulmonary artery systolic pressure. The tricuspid regurgitant velocity is 2.81 m/s, and with an assumed right atrial pressure of 15 mmHg, the estimated right ventricular systolic pressure is 46.6 mmHg. Left Atrium: Left atrial size was mildly dilated. Right Atrium: Right atrial size was severely dilated. Pericardium: A small pericardial effusion is present. The pericardial effusion is posterior to the left ventricle. Mitral Valve: The mitral valve is degenerative in appearance.  Moderate to severe mitral annular calcification. Moderate mitral valve regurgitation. MV peak gradient, 9.7 mmHg. The mean mitral valve gradient is 4.0 mmHg. Tricuspid Valve: The tricuspid valve is abnormal. Tricuspid valve regurgitation is severe. No evidence of tricuspid stenosis. The flow in the hepatic veins is reversed during ventricular systole. Aortic Valve: Moderate to severe AS is present. Vmax 3.3 m/s, MG 30 mmHG, AVA 1.05 cm2. AoV CW signal is rounded suggestive of severe AS. Moderate AI is present which makes AVA by VTI problematic. Suspect moderate to severe AS is present with moderate AI, combined severe aortic valvular heart disease is present. The aortic valve is tricuspid. There is moderate calcification of the aortic valve. There is moderate thickening of the aortic valve. Aortic valve regurgitation is moderate. Aortic regurgitation PHT measures 206 msec. Moderate to severe aortic stenosis is present. Aortic valve mean gradient measures 30.5 mmHg. Aortic valve peak gradient measures 45.0 mmHg. Aortic valve area, by VTI measures 1.05 cm. Pulmonic Valve: The pulmonic valve was grossly normal. Pulmonic valve regurgitation is mild. No evidence of pulmonic stenosis. Aorta: The aortic root and ascending  aorta are structurally normal, with no evidence of dilitation. Venous: The inferior vena cava is dilated in size with less than 50% respiratory variability, suggesting right atrial pressure of 15 mmHg. IAS/Shunts: The atrial septum is grossly normal. Additional Comments: Mild ascites is present.  LEFT VENTRICLE PLAX 2D LVIDd:         3.25 cm     Diastology LVIDs:         2.70 cm     LV e' medial:    7.62 cm/s LV PW:         1.15 cm     LV E/e' medial:  17.5 LV IVS:        1.20 cm     LV e' lateral:   7.72 cm/s LVOT diam:     2.00 cm     LV E/e' lateral: 17.3 LV SV:         70 LV SV Index:   39 LVOT Area:     3.14 cm  LV Volumes (MOD) LV vol d, MOD A2C: 50.5 ml LV vol d, MOD A4C: 59.9 ml LV vol s, MOD  A2C: 29.2 ml LV vol s, MOD A4C: 32.1 ml LV SV MOD A2C:     21.3 ml LV SV MOD A4C:     59.9 ml LV SV MOD BP:      25.9 ml RIGHT VENTRICLE            IVC RV S prime:     8.59 cm/s  IVC diam: 2.40 cm TAPSE (M-mode): 1.9 cm LEFT ATRIUM             Index        RIGHT ATRIUM           Index LA diam:        4.00 cm 2.23 cm/m   RA Area:     23.50 cm LA Vol (A2C):   47.3 ml 26.40 ml/m  RA Volume:   82.40 ml  45.98 ml/m LA Vol (A4C):   48.5 ml 27.07 ml/m LA Biplane Vol: 49.6 ml 27.68 ml/m  AORTIC VALVE                     PULMONIC VALVE AV Area (Vmax):    1.30 cm      PR End Diast Vel: 1.76 msec AV Area (Vmean):   1.10 cm AV Area (VTI):     1.05 cm AV Vmax:           335.33 cm/s AV Vmean:          262.500 cm/s AV VTI:            0.663 m AV Peak Grad:      45.0 mmHg AV Mean Grad:      30.5 mmHg LVOT Vmax:         139.00 cm/s LVOT Vmean:        91.600 cm/s LVOT VTI:          0.222 m LVOT/AV VTI ratio: 0.33 AI PHT:            206 msec  AORTA Ao Root diam: 2.80 cm Ao Asc diam:  3.10 cm MITRAL VALVE                TRICUSPID VALVE MV Area (PHT): 4.01 cm     TR Peak grad:   31.6 mmHg MV Peak grad:  9.7 mmHg     TR Vmax:  281.00 cm/s MV Mean grad:  4.0 mmHg MV Vmax:       1.56 m/s     SHUNTS MV Vmean:      88.8 cm/s    Systemic VTI:  0.22 m MV Decel Time: 189 msec     Systemic Diam: 2.00 cm MV E velocity: 133.67 cm/s MV A velocity: 89.47 cm/s MV E/A ratio:  1.49 Lennie Odor MD Electronically signed by Lennie Odor MD Signature Date/Time: September 25, 2023/11:35:01 AM    Final      Medications:     Current Medications:  aspirin EC  81 mg Oral Daily   atorvastatin  80 mg Oral Q breakfast   Chlorhexidine Gluconate Cloth  6 each Topical Daily   clopidogrel  75 mg Oral Daily   famotidine  20 mg Oral BID   levothyroxine  75 mcg Oral Q0600    Infusions:  epinephrine 6 mcg/min (09-25-2023 1028)   heparin 800 Units/hr (2023-09-25 0510)      Patient Profile  Latoya Lopez is a 82 year old with history of ESRD (HD  Tue/Thr/Sat), HTN, hypothyroidism, anemia, HLD,  ascites, and PAD. Had Paracentesis Spetmeber 2024. Followed by [facility] and recently having bilateral lower extremity pain. ABI with mild-mod disease demonstrating tibial disease.  Had an outpatient angiogram with ballon angioplasty on 09/04/23.   Admitted with shock possible cardiogenic/septic.   Assessment/Plan  1. Shock  Cardiogenic versus Septic. Echo akinetic RV + Severe RV failure.  WBC 16.8. Check Lactic acid . Check blood cultures.  Had Paracentesis in September. Complaining of abdominal pain. ? Possible spontaneous bacterial peritonitis.   Continue epi 6 mcg.   2. NSTEMI  HS Trop 2956>2130. EKG- no ST changes identified. On heparin drip.  Cath discussed but deferred for now given risk/benefit. Continue heparin drip.   3. Acute Hypoxic Respiratory Failure  Hypoxic on arrival. Improved with oxygen.   4. PAD -09/04/23 S/P Aortogram balloon angioplasty R distal superficial femoral artery.  - On aspirin, plavix, atorvastatin  5. ESRD -Had HD 09/10/23.  -Nephrology consulted.    6.Hypothyroidism   Length of Stay: 0  Tonye Becket, NP  September 25, 2023, 12:08 PM  Advanced Heart Failure Team Pager [phone] (M-F; 7a - 5p)  Please contact [facility] for night-coverage after hours (4p -7a ) and weekends on [email or web address]

## 2023-09-11 NOTE — Progress Notes (Addendum)
PHARMACY - ANTICOAGULATION CONSULT NOTE  Pharmacy Consult for heparin Indication: chest pain/ACS  No Known Allergies  Patient Measurements: Height: 5\' 9"  (175.3 cm) Weight: 65.8 kg (145 lb 1 oz) IBW/kg (Calculated) : 66.2 Heparin Dosing Weight: 65.8 kg  Vital Signs: BP: 119/95 (10/30 0509) Pulse Rate: 98 (10/30 0509)  Labs:  Estimated Creatinine Clearance: 10.6 mL/min (A) (by C-G formula based on SCr of 4.4 mg/dL (H)).   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   Dyspnea    when climbing stairs   ESRD (end stage renal disease) (HCC)    hemodialysis T-Th-S  at [facility]   GERD (gastroesophageal reflux disease)    Heart murmur    "Nothing to be concerned about, the heart Dr said."   Hyperkalemia    Hyperlipidemia    Hyperphosphatemia    Hypertension    Essential with goal blood pressure less than 130/80, 12/22/2019-    Hypokalemia    Hypoparathyroidism (HCC)    Hypothyroidism    Membranous glomerulonephritis    Proteinuria    S/P partial hysterectomy    Seasonal allergies    Secondary hyperparathyroidism, renal (HCC)    Wears glasses    Assessment: 10 yoF with history of bilat lower extremity pain s/p aortogram w/ LLE angiogram and balloon angioplasty of L. Superficial artery presented to [facility] ED with right groin pain, transferred to [facility]. Pharmacy consulted to dose heparin for ACS.  -Hgb 7.9, plts 304, no PTA anticoagulation reported -CTA aorta 10/30 > mod to severe stenosis of celiac artery with probable small dissection flap in proximal common hepatic artery  -Patient started on heparin for NSTEMI ~0200 with a 2500 unit bolus given and then started on 800 units/hr -Levels drawn show aPTT 126, heparin level 0.4 (was drawn not at steady state) and per RN no signs/symptoms of bleeding  Goal of Therapy:  Heparin level  0.3-0.7 units/ml Monitor platelets by anticoagulation protocol: Yes   Plan:  -Continue heparin 800 units/hr -Follow up heparin level at steady state (8h from time of start) -CBC and heparin level daily -F/u vascular consult   Arabella Merles, PharmD. Clinical Pharmacist 09/11/2023 6:31 AM

## 2023-09-11 NOTE — Progress Notes (Signed)
NAME:  Latoya Lopez, MRN:  [id number], DOB:  [birth year], LOS: 0 ADMISSION DATE:  09/02/2023, CONSULTATION DATE:  09/03/2023 REFERRING MD:  [facility] ED CHIEF COMPLAINT:  L groin pain   History of Present Illness:  Latoya Lopez is a 82 y.o. female who has a PMH as below. She presented to [facility] ED 10/29 with left groin pain after she had a aortogram with LLE angiogram and balloon angioplasty of L superficial femoral artery 1 week prior (09/04/23) for critical bilateral limb ischemia. She has had no external bleeding or bruising following the procedure.  On EMS arrival, she was hypoxic to the 80s with improvement to 90s after being placed on 4L O2. Workup in ED suggestive of NSTEMI and presumed cardiogenic shock. She was started on Heparin. She remained hypotensive after IVF's and was started on Epinephrine infusion. She had a right femoral CVL placed at [place].  She was later transferred to [facility] for ongoing workup and management.  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); Fever; Anemia of chronic disease; Hyperlipidemia; Acute respiratory failure with hypoxia (HCC); Sepsis without acute organ dysfunction (HCC); Acute on chronic diastolic CHF (congestive heart failure) (HCC); Aortic valve stenosis; Atherosclerosis of native coronary artery of native heart without angina pectoris; Benign hypertension with chronic kidney disease, stage V (HCC); Disorder of phosphorus metabolism, unspecified; ESRD on hemodialysis (HCC); Former smoker; Gastroesophageal reflux disease without esophagitis; Heart failure, unspecified (HCC); History of Mohs micrographic surgery for skin cancer; History of tobacco abuse; Hypercalcemia; and NSTEMI (non-ST elevated myocardial infarction) (HCC) on their problem  list.  Significant Hospital Events: Including procedures, antibiotic start and stop dates in addition to other pertinent events   10/30 admit with acute concern for NSTEMI with superimposed cardiogenic shock CXR  > CM, vascular congestion. CTA aortaat OSH > mod to severe stenosis of celiac artery with probable small dissection flap in proximal common hepatic artery, sever narrowing of bilateral renal arteries, hepatic steatosis suggestive of cirrhosis, large volume ascites, cholelithiasis, R>L pleural effusions.  Interim History / Subjective:    Objective:  Blood pressure (!) 91/58, pulse 95, temperature 98.1 F (36.7 C), temperature source Oral, resp. rate (!) 23, height 5\' 9"  (1.753 m), weight 64.9 kg, SpO2 95%.       No intake or output data in the 24 hours ending 08/27/2023 0942 Filed Weights   09/12/2023 0548 08/28/2023 0630  Weight: 65.8 kg 64.9 kg    Examination: General: Acute on chronic ill-appearing deconditioned elderly female lying in bed in no acute distress HEENT: [place]/AT, MM pink/moist, PERRL,  Neuro: Alert and oriented x 3, nonfocal, slight essential tremor CV: s1s2 regular rate and rhythm, no murmur, rubs, or gallops,  PULM: Clear to auscultation bilaterally, no increased work of breathing, no added breath sounds GI: soft, bowel sounds hypoactive in all 4 quadrants, abdomen taut/distended Extremities: Bilateral feet cool to touch, no edema Skin: no rashes or lesions  Resolved problems    Assessment & Plan:   NSTEMI -High-sensitivity troponin 7332 Presumed cardiogenic shock -Has femoral CVL so unfortunately no CVP or co-ox's currently.  Hx CHF, HTN, HLD P: Continuous telemetry  Follow-up echocardiogram Strict intake and output  Daily weight to assess volume status Daily assessment for need to diurese  Closely monitor renal function and electrolytes  Continue heparin drip Continue aspirin and Plavix Home amlodipine and carvedilol on  hold  Acute hypoxic  respiratory failure Acute pulmonary edema Pleural effusions R > L P: Continue supplemental oxygen for sat goal greater than 92 Volume removal per HD versus CRRT Encourage pulmonary hygiene Mobilize as able  Probable small dissection flap of proximal common hepatic artery -CTA aorta 10/30 > mod to severe stenosis of celiac artery with probable small dissection flap in proximal common hepatic artery, severe narrowing of bilateral renal arteries,  Bilateral critical limb ischemia - s/p aortogram with LLE angiogram and balloon angioplasty of L superficial femoral artery 09/04/23. P: Discussed CTA images with Dr. Chestine Spore with vascular surgery a.m. a 10/30 no acute evidence of dissection but widespread atherosclerosis Supportive care  Hypokalemia. ESRD on HD TTS  - last full session Tues 10/29. P: Nephrology consulted, appreciate assistance Trend Bmet Avoid nephrotoxins  Chronic ascites  - last paracentesis September 2024  3.1L removed  P: Volume removal per dialysis Follow for need for paracentesis  Acute on chronic anemia P: Trend CBC Transfuse per protocol Hemoglobin goal greater than 7  Hx Hypothyroidism P: Continue home Synthroid   Best practice (evaluated daily):  Diet/type: NPO DVT prophylaxis: systemic heparin GI prophylaxis: H2B Lines: Central line Foley:  N/A Code Status:  full code Last date of multidisciplinary goals of care discussion: Pending  Critical care time:.  CRITICAL CARE Performed by: Jahniyah Revere D. Harris   Total critical care time: 39 minutes  Critical care time was exclusive of separately billable procedures and treating other patients.  Critical care was necessary to treat or prevent imminent or life-threatening deterioration.  Critical care was time spent personally by me on the following activities: development of treatment plan with patient and/or surrogate as well as nursing, discussions with consultants, evaluation of patient's response  to treatment, examination of patient, obtaining history from patient or surrogate, ordering and performing treatments and interventions, ordering and review of laboratory studies, ordering and review of radiographic studies, pulse oximetry and re-evaluation of patient's condition.  Annia Gomm D. Harris, NP-C [facility] Personal contact information can be found on Amion  If no contact or response made please call [phone] 08/28/2023, 10:21 AM

## 2023-09-11 NOTE — Consult Note (Signed)
Renal Service Consult Note [facility]  Latoya Lopez 09/11/2023 Latoya Krabbe, MD Requesting Physician: Dr. Merrily Pew  Reason for Consult: ESRD pt w/ cardiogenic shock/ acute nstemi HPI: The patient is a 82 y.o. year-old w/ PMH as below who presented to OSH w/ c/o L groin pain. 1 week prior she had aortogram and LLE angiogram w/ angioplasty of L SFA for bilat limb ischemia. EMS found pt to be hypoxic to the 80s improving to 90s w/ 4L [place] O2. ED w/u showed nstemi. Started on IV heparin. Presumed to be having cardiogenic shock. She required epi IV infusion for BP support. Pt was transferred to [facility] ICU. We are asked to see for RRT.    Pt seen in room. She is elderly and deconditioned but alert and pleasant. No current SOB or other c/o's.   ROS - denies CP, no joint pain, no HA, no blurry vision, no rash, no diarrhea, no nausea/ vomiting, no dysuria, no difficulty voiding   Past Medical History  Past Medical History:  Diagnosis Date   Anemia of renal disease    Arthritis    osteoarthritis   Cancer (HCC)    skin- basal- 7 times and location   Dyspnea    when climbing stairs   ESRD (end stage renal disease) (HCC)    hemodialysis T-Th-S  at [facility]   GERD (gastroesophageal reflux disease)    Heart murmur    "Nothing to be concerned about, the heart Dr said."   Hyperkalemia    Hyperlipidemia    Hyperphosphatemia    Hypertension    Essential with goal blood pressure less than 130/80, 12/22/2019-    Hypokalemia    Hypoparathyroidism (HCC)    Hypothyroidism    Membranous glomerulonephritis    Proteinuria    S/P partial hysterectomy    Seasonal allergies    Secondary hyperparathyroidism, renal (HCC)    Wears glasses    Past Surgical History  Past Surgical History:  Procedure Laterality Date   A/V FISTULAGRAM N/A 02/10/2020   Procedure: A/V FISTULAGRAM - Left Arm;  Surgeon: Cephus Shelling, MD;  Location: [facility] INVASIVE CV LAB;  Service:  Cardiovascular;  Laterality: N/A;   ABDOMINAL AORTOGRAM W/LOWER EXTREMITY N/A 09/04/2023   Procedure: ABDOMINAL AORTOGRAM W/LOWER EXTREMITY;  Surgeon: Victorino Sparrow, MD;  Location: [facility] INVASIVE CV LAB;  Service: Cardiovascular;  Laterality: N/A;   ABDOMINAL HYSTERECTOMY     PARTIAL   AV FISTULA PLACEMENT Right 08/06/2017   Procedure: ARTERIOVENOUS (AV) FISTULA CREATION RIGHT ARM;  Surgeon: Sherren Kerns, MD;  Location: [facility] OR;  Service: Vascular;  Laterality: Right;   AV FISTULA PLACEMENT Right 08/20/2017   Procedure: INSERTION OF ARTERIOVENOUS (AV) GORE-TEX STRETCH GRAFT INTO RIGHT ARM;  Surgeon: Sherren Kerns, MD;  Location: [facility] OR;  Service: Vascular;  Laterality: Right;   AV FISTULA PLACEMENT Left 05/29/2019   Procedure: ARTERIOVENOUS (AV) FISTULA CREATION LEFT ARM;  Surgeon: Nada Libman, MD;  Location: [facility] OR;  Service: Vascular;  Laterality: Left;   AV FISTULA PLACEMENT Left 12/23/2019   Procedure: INSERTION OF LEFT ARM ARTERIOVENOUS (AV) artegraft;  Surgeon: Nada Libman, MD;  Location: [facility] OR;  Service: Vascular;  Laterality: Left;   BASCILIC VEIN TRANSPOSITION Left 07/22/2019   Procedure: SECOND STAGE BASILIC VEIN TRANSPOSITION LEFT ARM;  Surgeon: Nada Libman, MD;  Location: [facility] OR;  Service: Vascular;  Laterality: Left;   COLONOSCOPY     INSERTION OF DIALYSIS CATHETER N/A 08/17/2017  Procedure: INSERTION OF DIALYSIS CATHETER;  Surgeon: Larina Earthly, MD;  Location: [facility] OR;  Service: Vascular;  Laterality: N/A;   INSERTION OF DIALYSIS CATHETER Right 09/21/2018   Procedure: INSERTION OF DIALYSIS CATHETER;  Surgeon: Cephus Shelling, MD;  Location: [facility] OR;  Service: Vascular;  Laterality: Right;   PERIPHERAL VASCULAR INTERVENTION  09/04/2023   Procedure: PERIPHERAL VASCULAR INTERVENTION;  Surgeon: Victorino Sparrow, MD;  Location: [facility] INVASIVE CV LAB;  Service: Cardiovascular;;   REVISION OF ARTERIOVENOUS GORETEX GRAFT Right 07/07/2018   Procedure: REVISION OF ARTERIOVENOUS  GORETEX GRAFT;  Surgeon: Sherren Kerns, MD;  Location: [facility] OR;  Service: Vascular;  Laterality: Right;   SKIN BIOPSY      SKIN CANCER 6 AREAS   THROMBECTOMY AND REVISION OF ARTERIOVENTOUS (AV) GORETEX  GRAFT Right 09/17/2018   Procedure: THROMBECTOMY AND REVISION OF ARTERIOVENTOUS (AV) GORETEX  GRAFT;  Surgeon: Nada Libman, MD;  Location: [facility] OR;  Service: Vascular;  Laterality: Right;   TUBUALIGATION     Family History  Family History  Problem Relation Age of Onset   Stroke Mother    Heart attack Father    Heart attack Sister    Social History  reports that she quit smoking about 27 years ago. Her smoking use included cigarettes. She has never used smokeless tobacco. She reports that she does not drink alcohol and does not use drugs. Allergies No Known Allergies Home medications Prior to Admission medications   Medication Sig Start Date End Date Taking? Authorizing Provider  acetaminophen (TYLENOL) 500 MG tablet Take 500 mg by mouth every 6 (six) hours as needed for mild pain or headache.   Yes [provider]  amLODipine (NORVASC) 5 MG tablet Take 5 mg by mouth at bedtime. 05/11/19  Yes [provider]  aspirin EC 81 MG tablet Take 1 tablet (81 mg total) by mouth daily. Swallow whole. 09/04/23 09/03/24 Yes Victorino Sparrow, MD  atorvastatin (LIPITOR) 80 MG tablet Take 80 mg by mouth daily with breakfast.    Yes [provider]  calcium acetate (PHOSLO) 667 MG capsule Take 2 capsules (1,334 mg total) by mouth 3 (three) times daily with meals. Patient taking differently: Take 1,334 mg by mouth 2 (two) times daily with a meal. 1 in the Morning, and 1 at Bedtime 08/21/17  Yes Maxie Barb, MD  carvedilol (COREG) 25 MG tablet Take 25 mg by mouth See admin instructions. Take 1 tablet (25 mg) by mouth in the evening ONLY on Tuesdays, Thursdays, & Saturdays. Take 1 tablet (25 mg) by mouth twice daily on Sundays, Mondays, Wednesdays, & Fridays.   Yes  [provider]  clopidogrel (PLAVIX) 75 MG tablet Take 1 tablet (75 mg total) by mouth daily. 09/04/23 09/03/24 Yes Victorino Sparrow, MD  famotidine (PEPCID) 20 MG tablet Take 20 mg by mouth 2 (two) times daily.   Yes [provider]  gabapentin (NEURONTIN) 100 MG capsule Take 100 mg by mouth at bedtime. 05/04/20  Yes [provider]  levothyroxine (SYNTHROID, LEVOTHROID) 75 MCG tablet Take 75 mcg by mouth daily before breakfast.   Yes [provider]  lidocaine-prilocaine (EMLA) cream Apply 1 Application topically as needed. 06/05/23  Yes McCaughan, Dia D, DPM  loperamide (IMODIUM A-D) 2 MG tablet Take 2 mg by mouth daily as needed (Diarrhea). 08/27/23 08/25/24 Yes [provider]  omeprazole (PRILOSEC) 20 MG capsule Take 20 mg by mouth daily.   Yes [provider]  pentoxifylline (TRENTAL)  400 MG CR tablet Take 1 tablet (400 mg total) by mouth daily. On your dialysis days, take your tablet after dialysis. Patient taking differently: Take 400 mg by mouth at bedtime. 06/22/23 09/20/23 Yes McCaughan, Dia D, DPM  raloxifene (EVISTA) 60 MG tablet Take 60 mg by mouth daily with breakfast.    Yes [provider]     Vitals:   09/11/23 1130 09/11/23 1145 09/11/23 1200 09/11/23 1215  BP: 100/61 (!) 96/45 (!) 86/57 (!) 91/55  Pulse: 92 90 92 93  Resp: 20 20 (!) 28 (!) 26  Temp:      TempSrc:      SpO2: 99% 100% 99% 97%  Weight:      Height:       Exam Gen alert, no distress No rash, cyanosis or gangrene Sclera anicteric, throat clear  No jvd or bruits Chest slight basilar crackles, o/w clear bilat RRR no RG Abd firm, distended 2+ ascites GU defer MS no joint effusions or deformity Ext 1+ R pretib > L pretib edema, no wounds or ulcers Neuro frail and alert, Ox 3 , nf    LFA AVG + bruit     Renal-related home meds: - norvasc 5 - phoslo 2 ac - coreg 25 mg as directed - gabapentin 100 hs - asa /plavix    OP HD: TTS  [place] 3h   400/500  61.7kg   2/2 bath  AVG    Heparin none - last OP HD 10/29, post wt 62.4kg     - has been getting to dry wt  - rocaltrol 0.75 mcg  - mircera 225 mcg IF q 2 wks, last 10/29, due 11/12    BP 95/57, HR 94, RR 19-26, tempo 98   4L [place] O2= 99%     K 3.4 CO2 23  AG 17  BUN 16  creat 4.4  alb 1.8  trop 7332    wBC 16K  Hb 7.9     plt 304    CXR - not done     Pressors: epi gtt @ 4- 6 mcg/min  Assessment/ Plan: Acute NSTEMI - per cards/ ccm Acute bivent HFrEF w/ cardiogenic shock - BP's in the 90s here on epi gtt low-dose.  Acute resp failure - on 4L [place] O2 ESKD - on HD TTS, last HD yesterday. Labs and vol are stable. Will hold off on dialysis for 1-2 days to see if hemodynamics improve. Also palliative care to be involved. Pt's cardiac prognosis is not good and this will affect her dialysis.  Volume - mild LE edema, ordered CXR. Up 2-3 kg by wts.  Anemia of eskd - Hb 7.9 here. Just rec'd ESA on 10/29, not due til 11/12. Follow MBD ckd - CCa in range, add on phos. Cont phoslo as binder, and po vdra for now.  Ascites - h/o same in the chart      Vinson Moselle  MD [facility] 09/11/2023, 3:43 PM  Recent Labs  Lab 09/11/23 0531 09/11/23 0622  HGB 7.9*  --   ALBUMIN  --  1.8*  CALCIUM 8.2*  --   CREATININE 4.40*  --   K 3.4*  --    Inpatient medications:  aspirin EC  81 mg Oral Daily   atorvastatin  80 mg Oral Q breakfast   Chlorhexidine Gluconate Cloth  6 each Topical Daily   clopidogrel  75 mg Oral Daily   famotidine  20 mg Oral BID   levothyroxine  75  mcg Oral Q0600    albumin human     epinephrine 5 mcg/min (09/11/23 1200)   docusate sodium, mouth rinse, polyethylene glycol

## 2023-09-11 NOTE — Progress Notes (Signed)
  Echocardiogram 2D Echocardiogram has been performed.  Janalyn Harder 09/11/2023, 9:54 AM

## 2023-09-11 NOTE — Progress Notes (Signed)
PHARMACY - ANTICOAGULATION CONSULT NOTE  Pharmacy Consult for heparin Indication: chest pain/ACS  No Known Allergies  Patient Measurements: Height: 5\' 9"  (175.3 cm) Weight: 64.9 kg (143 lb 1.3 oz) IBW/kg (Calculated) : 66.2 Heparin Dosing Weight: 65.8 kg  Vital Signs: Temp: 98.1 F (36.7 C) (10/30 0730) Temp Source: Oral (10/30 0730) BP: 95/59 (10/30 1100) Pulse Rate: 93 (10/30 1100)  Labs:  Estimated Creatinine Clearance: 10.4 mL/min (A) (by C-G formula based on SCr of 4.4 mg/dL (H)).   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   Dyspnea    when climbing stairs   ESRD (end stage renal disease) (HCC)    hemodialysis T-Th-S  at [facility]   GERD (gastroesophageal reflux disease)    Heart murmur    "Nothing to be concerned about, the heart Dr said."   Hyperkalemia    Hyperlipidemia    Hyperphosphatemia    Hypertension    Essential with goal blood pressure less than 130/80, 12/22/2019-    Hypokalemia    Hypoparathyroidism (HCC)    Hypothyroidism    Membranous glomerulonephritis    Proteinuria    S/P partial hysterectomy    Seasonal allergies    Secondary hyperparathyroidism, renal (HCC)    Wears glasses    Assessment: 89 yoF with history of bilat lower extremity pain s/p aortogram w/ LLE angiogram and balloon angioplasty of L. Superficial artery presented to [facility] ED with right groin pain, transferred to [facility]. Pharmacy consulted to dose heparin for ACS.  Heparin level this morning is within goal range.  No overt bleeding or complications noted.  Goal of Therapy:  Heparin level 0.3-0.7 units/ml Monitor platelets by anticoagulation protocol: Yes   Plan:  -Continue heparin 800 units/hr -CBC and heparin level daily -F/u vascular consult   Jenetta Downer, [facility] Clinical Pharmacist  09/11/2023 11:23 AM   [facility]  pharmacy phone numbers are listed on [email or web address]

## 2023-09-11 NOTE — TOC Initial Note (Signed)
Transition of Care [facility]) - Initial/Assessment Note    Patient Details  Name: Latoya Lopez MRN: [id number] Date of Birth: Jun 12, 1941  Transition of Care [facility]) CM/SW Contact:    Elliot Cousin, RN Phone Number: [phone] 08/15/2023, 5:39 PM  Clinical Narrative:  CM spoke to pt's husband. Pt has RW at home.  She had oxygen at home in the past.  Will continue to follow for dc needs.                   Expected Discharge Plan: Home w Home Health Services Barriers to Discharge: Continued Medical Work up   Patient Goals and CMS Choice Patient states their goals for this hospitalization and ongoing recovery are:: wants pt to get better          Expected Discharge Plan and Services   Discharge Planning Services: CM Consult                                          Prior Living Arrangements/Services   Lives with:: Spouse Patient language and need for interpreter reviewed:: Yes Do you feel safe going back to the place where you live?: Yes      Need for Family Participation in Patient Care: Yes (Comment) Care giver support system in place?: Yes (comment) Current home services: DME Criminal Activity/Legal Involvement Pertinent to Current Situation/Hospitalization: No - Comment as needed  Activities of Daily Living      Permission Sought/Granted Permission sought to share information with : Case Manager, Family Supports, PCP Permission granted to share information with : Yes, Verbal Permission Granted  Share Information with NAME: [name]  Permission granted to share info w AGENCY: Home Health  Permission granted to share info w Relationship: husband  Permission granted to share info w Contact Information: [phone]  Emotional Assessment Appearance:: Appears stated age Attitude/Demeanor/Rapport: Engaged Affect (typically observed): Accepting Orientation: : Oriented to Self, Oriented to Place, Oriented to  Time, Oriented to Situation    Psych Involvement: No (comment)  Admission diagnosis:  NSTEMI (non-ST elevated myocardial infarction) [facility]) [I21.4] Patient Active Problem List   Diagnosis Date Noted   NSTEMI (non-ST elevated myocardial infarction) (HCC) 09/03/2023   History of tobacco abuse 01/25/2023   Atherosclerosis of native coronary artery of native heart without angina pectoris 03/30/2022   Former smoker 03/30/2022   Aortic valve stenosis 02/12/2022   ESRD on hemodialysis (HCC) 02/03/2022   Acute on chronic diastolic CHF (congestive heart failure) (HCC) 03/07/2021   Sepsis without acute organ dysfunction (HCC)    Fever 12/28/2019   Anemia of chronic disease 12/28/2019   Hyperlipidemia 12/28/2019   Acute respiratory failure with hypoxia (HCC) 12/28/2019   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) 09/20/2018   Hypercalcemia 12/11/2017   Disorder of phosphorus metabolism, unspecified 08/21/2017   Gastroesophageal reflux disease without esophagitis 08/21/2017   Heart failure, unspecified (HCC) 08/21/2017   ESRD (end stage renal disease) (HCC) 08/17/2017   HTN (hypertension), benign 08/17/2017   Hypothyroidism 08/17/2017   Acute congestive heart failure (HCC)    Pleural effusion    Benign hypertension with chronic kidney disease, stage V (HCC) 11/26/2016   History of Mohs micrographic surgery for skin cancer 11/26/2016   PCP:  Patient, No Pcp Per Pharmacy:   [id number] - [place], [place] - [address] AT [address] [place] [address] Phone: [phone] Fax: [phone]  [facility] - [address] [place] - [address] [place] [address] Phone: [phone] Fax: [phone]     Social Determinants of Health (SDOH) Social History: SDOH Screenings   Transportation Needs: No Transportation Needs (02/09/2022)   Received from [facility]  Financial Resource Strain:  Low Risk  (02/05/2022)   Received from [facility], [facility]  Social Connections: Unknown (03/27/2022)   Received from [facility], [facility]  Stress: No Stress Concern Present (02/03/2022)   Received from [facility], [facility]  Tobacco Use: Medium Risk (08/19/2023)   SDOH Interventions:     Readmission Risk Interventions     No data to display

## 2023-09-11 NOTE — Procedures (Signed)
Paracentesis Procedure Note  Latoya Lopez  [id number]  23-Aug-1941  Date:09/11/23  Time:5:04 PM   Provider Performing:Roann Merk E  Cherlynn Polo    Procedure: Paracentesis with imaging guidance (40981)  Indication(s) Ascites  Consent Risks of the procedure as well as the alternatives and risks of each were explained to the patient and/or caregiver.  Consent for the procedure was obtained and is signed in the bedside chart  Anesthesia Topical only with 1% lidocaine    Time Out Verified patient identification, verified procedure, site/side was marked, verified correct patient position, special equipment/implants available, medications/allergies/relevant history reviewed, required imaging and test results available.   Sterile Technique Maximal sterile technique including full sterile barrier drape, hand hygiene, sterile gown, sterile gloves, mask, hair covering, sterile ultrasound probe cover (if used).   Procedure Description Ultrasound used to identify appropriate peritoneal anatomy for placement and overlying skin marked.  Area of drainage cleaned and draped in sterile fashion. Lidocaine was used to anesthetize the skin and subcutaneous tissue.  3000 cc's of yellow, clear appearing fluid was drained. Catheter then removed and bandaid applied to site.   Complications/Tolerance None; patient tolerated the procedure well.   EBL Minimal   Specimen(s) Peritoneal fluid  Under direct supervision of Janeann Forehand, NP

## 2023-09-11 NOTE — IPAL (Signed)
  Interdisciplinary Goals of Care Family Meeting   Date carried out: 09/11/2023  Location of the meeting: Bedside  Member's involved: Nurse Practitioner, Bedside Registered Nurse, and Family Member or next of kin  Durable Power of Attorney or acting medical decision maker: Patient with husband and son at bedside to assist on decision making     Discussion: Met with patient, spouse, and son at bedside to discuss current plan of care and recent diagnosis of cardiogenic shock with severe TR. Both patient and family state they understand the diagnosis but all feel overwhelmed at coming GOC decisions. At this time patient wishes to remain a full code with aggressive interventions and is open to ongoing discussions and a Palliative care consult   Code status: Full Code  Disposition: Continue current acute care  Time spent for the meeting: 25 mins   Catalino Plascencia D. Harris, NP-C [facility] Personal contact information can be found on Amion  If no contact or response made please call [phone] 09/11/2023, 3:41 PM

## 2023-09-11 NOTE — Progress Notes (Signed)
eLink Physician-Brief Progress Note Patient Name: Latoya Lopez DOB: [birth year] MRN: [id number]   Date of Service  09/11/2023  HPI/Events of Note  Patient admitted to the ICU for closer monitoring by vascular surgery s/p vascular procedure.  eICU Interventions  New Patient Evaluation.        [name] [name] 09/11/2023, 5:29 AM

## 2023-09-12 DIAGNOSIS — I214 Non-ST elevation (NSTEMI) myocardial infarction: Secondary | ICD-10-CM | POA: Diagnosis not present

## 2023-09-12 DIAGNOSIS — I5021 Acute systolic (congestive) heart failure: Secondary | ICD-10-CM | POA: Diagnosis not present

## 2023-09-12 DIAGNOSIS — R579 Shock, unspecified: Secondary | ICD-10-CM

## 2023-09-12 LAB — HEPARIN LEVEL (UNFRACTIONATED): Heparin Unfractionated: 0.2 [IU]/mL — ABNORMAL LOW (ref 0.30–0.70)

## 2023-09-12 LAB — BASIC METABOLIC PANEL
Anion gap: 11 (ref 5–15)
BUN: 22 mg/dL (ref 8–23)
CO2: 25 mmol/L (ref 22–32)
Calcium: 8.7 mg/dL — ABNORMAL LOW (ref 8.9–10.3)
Chloride: 99 mmol/L (ref 98–111)
Creatinine, Ser: 4.95 mg/dL — ABNORMAL HIGH (ref 0.44–1.00)
GFR, Estimated: 8 mL/min — ABNORMAL LOW (ref >60)
Glucose, Bld: 137 mg/dL — ABNORMAL HIGH (ref 70–99)
Potassium: 4.2 mmol/L (ref 3.5–5.1)
Sodium: 135 mmol/L (ref 135–145)

## 2023-09-12 LAB — CBC
HCT: 25.9 % — ABNORMAL LOW (ref 36.0–46.0)
Hemoglobin: 7.9 g/dL — ABNORMAL LOW (ref 12.0–15.0)
MCH: 29.3 pg (ref 26.0–34.0)
MCHC: 30.5 g/dL (ref 30.0–36.0)
MCV: 95.9 fL (ref 80.0–100.0)
Platelets: 268 10*3/uL (ref 150–400)
RBC: 2.7 MIL/uL — ABNORMAL LOW (ref 3.87–5.11)
RDW: 21.2 % — ABNORMAL HIGH (ref 11.5–15.5)
WBC: 13.4 10*3/uL — ABNORMAL HIGH (ref 4.0–10.5)
nRBC: 0.3 % — ABNORMAL HIGH (ref 0.0–0.2)

## 2023-09-12 LAB — MAGNESIUM: Magnesium: 1.9 mg/dL (ref 1.7–2.4)

## 2023-09-12 LAB — PHOSPHORUS: Phosphorus: 5 mg/dL — ABNORMAL HIGH (ref 2.5–4.6)

## 2023-09-12 MED ORDER — GLYCOPYRROLATE 0.2 MG/ML IJ SOLN: 0.2000 mg | INTRAMUSCULAR | Status: DC | PRN

## 2023-09-12 MED ORDER — ACETAMINOPHEN 650 MG RE SUPP: 650.0000 mg | Freq: Four times a day (QID) | RECTAL | Status: DC | PRN

## 2023-09-12 MED ORDER — LORAZEPAM 2 MG/ML IJ SOLN: 2.0000 mg | INTRAMUSCULAR | Status: DC | PRN

## 2023-09-12 MED ORDER — FAMOTIDINE 20 MG PO TABS: 20.0000 mg | ORAL_TABLET | Freq: Every day | ORAL | Status: DC

## 2023-09-12 MED ORDER — MORPHINE BOLUS VIA INFUSION
2.0000 mg | INTRAVENOUS | Status: DC | PRN
  Administered 2023-09-12 (×3): 2 mg via INTRAVENOUS

## 2023-09-12 MED ORDER — GLYCOPYRROLATE 0.2 MG/ML IJ SOLN
0.2000 mg | INTRAMUSCULAR | Status: DC | PRN
Start: 2023-09-12 — End: 2023-09-13
  Filled 2023-09-12: qty 1

## 2023-09-12 MED ORDER — ACETAMINOPHEN 325 MG PO TABS: 650.0000 mg | ORAL_TABLET | Freq: Four times a day (QID) | ORAL | Status: DC | PRN

## 2023-09-12 MED ORDER — POLYVINYL ALCOHOL 1.4 % OP SOLN: 1.0000 [drp] | Freq: Four times a day (QID) | OPHTHALMIC | Status: DC | PRN

## 2023-09-12 MED ORDER — MIDODRINE HCL 5 MG PO TABS
10.0000 mg | ORAL_TABLET | Freq: Three times a day (TID) | ORAL | Status: DC
  Administered 2023-09-12 (×2): 10 mg via ORAL
  Filled 2023-09-12 (×2): qty 2

## 2023-09-12 MED ORDER — GLYCOPYRROLATE 1 MG PO TABS: 1.0000 mg | ORAL_TABLET | ORAL | Status: DC | PRN

## 2023-09-12 MED ORDER — MIDODRINE HCL 5 MG PO TABS: 5.0000 mg | ORAL_TABLET | Freq: Three times a day (TID) | ORAL | Status: DC

## 2023-09-12 MED ORDER — MORPHINE 100MG IN NS 100ML (1MG/ML) PREMIX INFUSION
0.0000 mg/h | INTRAVENOUS | Status: DC
  Administered 2023-09-12: 2 mg/h via INTRAVENOUS
  Filled 2023-09-12 (×2): qty 100

## 2023-09-12 NOTE — Progress Notes (Signed)
     Referral previously received for Latoya Lopez for goals of care discussion. Chart reviewed.  I went to the patient's bedside and there were multiple family members in the room. Outside the room I spoke with Janeann Forehand, NP with PCCM. She stated that the patient earlier today informed family she was tired and wanted to "go home." Subsequently they're planning to transition to comfort care in the coming hour, to be handled by PCCM.  Whitney states there are no additional palliative needs. At this time goals are clear. We will sign off for now and discontinue consult order. Please contact us and enter a new consult order for any new palliative care needs.  Thank you for your referral and allowing PMT to assist in Latoya Lopez [facility] care.   Wynne Dust, NP Palliative Medicine Team Phone: [phone]  NO CHARGE

## 2023-09-12 NOTE — Progress Notes (Signed)
Patient declaring to family and this RN "I want to go home."  CCM NP aware. Open visitation for family per CCM NP.

## 2023-09-12 NOTE — Progress Notes (Addendum)
NAME:  Latoya Lopez, MRN:  [id number], DOB:  August 11, 1941, LOS: 1 ADMISSION DATE:  08/23/2023, CONSULTATION DATE:  08/17/2023 REFERRING MD:  [facility] ED CHIEF COMPLAINT:  L groin pain   History of Present Illness:  Latoya Lopez is a 82 y.o. female who has a PMH as below. She presented to [facility] ED 10/29 with left groin pain after she had a aortogram with LLE angiogram and balloon angioplasty of L superficial femoral artery 1 week prior (09/04/23) for critical bilateral limb ischemia. She has had no external bleeding or bruising following the procedure.  On EMS arrival, she was hypoxic to the 80s with improvement to 90s after being placed on 4L O2. Workup in ED suggestive of NSTEMI and presumed cardiogenic shock. She was started on Heparin. She remained hypotensive after IVF's and was started on Epinephrine infusion. She had a right femoral CVL placed at [place].  She was later transferred to [facility] for ongoing workup and management.  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); Fever; Anemia of chronic disease; Hyperlipidemia; Acute respiratory failure with hypoxia (HCC); Sepsis without acute organ dysfunction (HCC); Acute on chronic diastolic CHF (congestive heart failure) (HCC); Aortic valve stenosis; Atherosclerosis of native coronary artery of native heart without angina pectoris; Benign hypertension with chronic kidney disease, stage V (HCC); Disorder of phosphorus metabolism, unspecified; ESRD on hemodialysis (HCC); Former smoker; Gastroesophageal reflux disease without esophagitis; Heart failure, unspecified (HCC); History of Mohs micrographic surgery for skin cancer; History of tobacco abuse; Hypercalcemia; and NSTEMI (non-ST elevated myocardial infarction) (HCC) on their problem  list.  Significant Hospital Events: Including procedures, antibiotic start and stop dates in addition to other pertinent events   10/30 admit with acute concern for NSTEMI with superimposed cardiogenic shock CXR  > CM, vascular congestion. CTA aortaat OSH > mod to severe stenosis of celiac artery with probable small dissection flap in proximal common hepatic artery, sever narrowing of bilateral renal arteries, hepatic steatosis suggestive of cirrhosis, large volume ascites, cholelithiasis, R>L pleural effusions. 10/31 echocardiogram obtained and revealed severe TR with underlying cardiogenic shock as well.  Heart failure consulted, remains on low-dose epi  Interim History / Subjective:  Alert and oriented, denies any acute complaints  Objective:  Blood pressure (!) 111/52, pulse 92, temperature 97.8 F (36.6 C), temperature source Oral, resp. rate 18, height 5\' 9"  (1.753 m), weight 61.8 kg, SpO2 97%.        Intake/Output Summary (Last 24 hours) at 09/12/2023 0933 Last data filed at 09/12/2023 0900 Gross per 24 hour  Intake 1220.89 ml  Output 3000 ml  Net -1779.11 ml   Filed Weights   08/20/2023 0548 08/26/2023 0630 09/12/23 0500  Weight: 65.8 kg 64.9 kg 61.8 kg    Examination: General: Acute on chronic ill-appearing elderly female lying in bed in no acute distress HEENT: [place]/AT, MM pink/moist, PERRL,  Neuro: Alert and oriented x 3, nonfocal CV: s1s2 regular rate and rhythm, no murmur, rubs, or gallops,  PULM: Clear to auscultation bilaterally, no increased work of breathing, no added breath sounds GI: soft, bowel sounds active in all 4 quadrants, non-tender, distended, tender to palpation Extremities: warm/dry, no edema  Skin: no rashes or lesions  Resolved problems    Assessment & Plan:   NSTEMI -High-sensitivity troponin 7332 Right heart failure with cardiogenic shock -Echo reviewed per heart failure with severe TR  and right heart failure Hx CHF, HTN, HLD P: Heart  failure following, appreciate assistance Continuous telemetry Strict intake and output Wean pressors as able SBP goal greater than 90 and/or MAP greater than 60 Add midodrine  Optimize electrolytes Hold home antihypertensives Continue aspirin and Plavix Continue heparin drip  Acute hypoxic respiratory failure Acute pulmonary edema Pleural effusions R > L P: Continue supplemental oxygen for sat goal greater than 92 Volume removal per HD glasses able Aspiration precautions  Probable small dissection flap of proximal common hepatic artery -CTA aorta 10/30 > mod to severe stenosis of celiac artery with probable small dissection flap in proximal common hepatic artery, severe narrowing of bilateral renal arteries - Discussed CTA images with Dr. Chestine Spore with vascular surgery a.m. a 10/30 no acute evidence of dissection but widespread atherosclerosis Bilateral critical limb ischemia - s/p aortogram with LLE angiogram and balloon angioplasty of L superficial femoral artery 09/04/23. P: Supportive care  Hypokalemia. ESRD on HD TTS  - last full session Tues 10/29. P: Nephrology following, appreciate assistance Trend BMPs Avoid nephrotoxins No acute indications for dialysis currently  Chronic ascites  -Paracentesis 10/30 with 3 L removed, Gram stain negative as of 10/21 P: Continue empiric ceftriaxone with low threshold to stop Repeat paracentesis as needed  Acute on chronic anemia P: Trend CBC Transfuse per protocol Hemoglobin goal greater than 7  Hx Hypothyroidism P: Continue home Synthroid   Goals of care Ongoing discussions for plan of care/goals of care.  Patient and family understand current critical illness and expected outcomes.  Tentative plan to wean off pressors as able with ultimate goal of discharge home with hospice   Best practice (evaluated daily):  Diet/type: NPO DVT prophylaxis: systemic heparin GI prophylaxis: H2B Lines: Central line Foley:  N/A Code  Status:  full code Last date of multidisciplinary goals of care discussion: Pending  Critical care time:.  CRITICAL CARE Performed by: Whitney D. Harris   Total critical care time: 37 minutes  Critical care time was exclusive of separately billable procedures and treating other patients.  Critical care was necessary to treat or prevent imminent or life-threatening deterioration.  Critical care was time spent personally by me on the following activities: development of treatment plan with patient and/or surrogate as well as nursing, discussions with consultants, evaluation of patient's response to treatment, examination of patient, obtaining history from patient or surrogate, ordering and performing treatments and interventions, ordering and review of laboratory studies, ordering and review of radiographic studies, pulse oximetry and re-evaluation of patient's condition.  Whitney D. Harris, NP-C [facility] Personal contact information can be found on Amion  If no contact or response made please call [phone] 09/12/2023, 9:33 AM     Critical care attending attestation note: I agree with the Advanced Practitioner's note, impression, and recommendations as outlined. I have taken an independent interval history, reviewed the chart and examined the patient. The following reflects my medical decision making and independent critical care time   Synopsis of assessment and plan: 82 year old female with end-stage renal disease on hemodialysis, severe peripheral arterial disease status post left lower extremity angioplasty who presented with acute NSTEMI  Patient still requiring epinephrine infusion but requirement is coming down SBP was ruled out Remain afebrile   Physical exam: General: Chronically ill-appearing female, lying on the bed HEENT: [place]/AT, eyes anicteric.  moist mucus membranes Neuro: Alert, awake following commands Chest: Coarse breath sounds, no wheezes or  rhonchi Heart: Regular rate and rhythm, no murmurs  or gallops Abdomen: Soft, nontender, nondistended, bowel sounds present Skin: No rash   Labs and images were reviewed  Assessment and plan: Acute NSTEMI Acute biventricular HFrEF with cardiogenic shock Acute respiratory failure with hypoxia, improving Moderate to severe ascites, SBP was ruled out End-stage renal disease on hemodialysis Severe peripheral arterial disease Anemia of chronic disease Hypokalemia, resolved  Continue aspirin and statin Continue medical management for NSTEMI Continue IV heparin infusion Monitor intake and output Continue to titrate oxygen with O2 sat goal 92% Paracentesis was done yesterday, SBP was ruled out Continue SBP prophylaxis with ceftriaxone Nephrology is following Monitor H&H and electrolytes  Continue goals of care discussion, guarded prognosis  This patient is critically ill with multiple organ system failure which requires frequent high complexity decision making, assessment, support, evaluation, and titration of therapies. This was completed through the application of advanced monitoring technologies and extensive interpretation of multiple databases.  During this encounter critical care time was devoted to patient care services described in this note for 31 minutes.    Cheri Fowler, MD [facility] See Amion for pager If no response to pager, please call [phone] until 7pm After 7pm, Please call E-link [phone]   09/12/2023, 11:58 AM

## 2023-09-12 NOTE — Progress Notes (Signed)
Heart Failure Navigator Progress Note  Assessed for Heart & Vascular TOC clinic readiness.  Patient does not meet criteria due to Advanced Heart Failure Team patient of Dr. Gasper Lloyd.  Navigator will sign off at this time.  Roxy Horseman, RN, BSN [facility] Heart Failure Navigator Secure Chat Only

## 2023-09-12 NOTE — Progress Notes (Signed)
PHARMACY - ANTICOAGULATION CONSULT NOTE  Pharmacy Consult for heparin Indication: chest pain/ACS  No Known Allergies  Patient Measurements: Height: 5\' 9"  (175.3 cm) Weight: 61.8 kg (136 lb 3.9 oz) IBW/kg (Calculated) : 66.2 Heparin Dosing Weight: 65.8 kg  Vital Signs: Temp: 97.8 F (36.6 C) (10/31 0400) Temp Source: Oral (10/31 0400) BP: 120/65 (10/31 0700) Pulse Rate: 95 (10/31 0700)  Labs:  Estimated Creatinine Clearance: 8.8 mL/min (A) (by C-G formula based on SCr of 4.95 mg/dL (H)).   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   Dyspnea    when climbing stairs   ESRD (end stage renal disease) (HCC)    hemodialysis T-Th-S  at [facility]   GERD (gastroesophageal reflux disease)    Heart murmur    "Nothing to be concerned about, the heart Dr said."   Hyperkalemia    Hyperlipidemia    Hyperphosphatemia    Hypertension    Essential with goal blood pressure less than 130/80, 12/22/2019-    Hypokalemia    Hypoparathyroidism (HCC)    Hypothyroidism    Membranous glomerulonephritis    Proteinuria    S/P partial hysterectomy    Seasonal allergies    Secondary hyperparathyroidism, renal (HCC)    Wears glasses    Assessment: 36 yoF with history of bilat lower extremity pain s/p aortogram w/ LLE angiogram and balloon angioplasty of L. Superficial artery presented to [facility] ED with right groin pain, transferred to [facility]. Pharmacy consulted to dose heparin for ACS.  Heparin off since ~1400 for paracentesis but restarted after. Heparin level this morning is subtherapeutic at 0.2, on 800 units/hr. Hgb low at 7.9, plt 268. No s/sx of bleeding or infusion issues.  Goal of Therapy:  Heparin level 0.3-0.7 units/ml Monitor platelets by anticoagulation protocol: Yes   Plan:  Increase heparin infusion to 900 units/hr Will f/u heparin level 8  hours Monitor heparin level, CBC, and s/sx of bleeding daily  Thank you for allowing pharmacy to participate in this patient's care,  Sherron Monday, PharmD, BCCCP Clinical Pharmacist  Phone: [phone] 09/12/2023 8:15 AM  Please check AMION for all [facility] Pharmacy phone numbers After 10:00 PM, call Main Pharmacy [phone]

## 2023-09-12 NOTE — Progress Notes (Signed)
[facility] Progress Note  Subjective: seen in ICU  Vitals:   09/12/23 0830 09/12/23 0845 09/12/23 0900 09/12/23 0915  BP: (!) 116/57 (!) 109/54 (!) 116/56 (!) 111/52  Pulse: 92 92 92 92  Resp: 18 17 15 18   Temp:      TempSrc:      SpO2: 98% 98% 96% 97%  Weight:      Height:        Exam: Gen alert, no distress, elderly frail WF No jvd or bruits Chest slight basilar crackles RRR no RG ABd abd softer post paracentesis Ext 1+ pretib edema Neuro frail, deconditioned, Ox 3 , nf    LFA AVG + bruit       Renal-related home meds: - norvasc 5 - phoslo 2 ac - coreg 25 mg as directed - gabapentin 100 hs - asa /plavix      OP HD: TTS [place] 3h   400/500  61.7kg   2/2 bath  AVG    Heparin none - last OP HD 10/29, post wt 62.4kg     - has been getting to dry wt  - rocaltrol 0.75 mcg  - mircera 225 mcg IF q 2 wks, last 10/29, due 11/12     BP 95/57, HR 94, RR 19-26, tempo 98   4L [place] O2= 99%     K 3.4 CO2 23  AG 17  BUN 16  creat 4.4  alb 1.8  trop 7332    wBC 16K  Hb 7.9     plt 304    CXR 10/30 - prob vasc congestion, +/- IS edema vs chronic markings     Pressors: epi gtt @ 4- 6 mcg/min   Assessment/ Plan: Acute NSTEMI - per cards/ ccm Acute bivent HFrEF w/ cardiogenic shock - BP's in the 90s here on epi gtt Acute resp failure - on 4L [place] O2 ESKD - on HD TTS, last HD yesterday. Labs and vol are stable. Will hold off on dialysis for now given severe heart failure and poor prognosis. Palliative consulting as well. Have d/w CCM who concur.  Volume - at dry wt today after paracentesis. CXR questionable vasc congestion vs change in technique c/t last film.  Anemia of eskd - Hb 7.9 here. Just rec'd ESA on 10/29, not due til 11/12. Follow MBD ckd - CCa and phos are in range. Cont phoslo as binder and po vdra.  Ascites - sp 3 L paracentesis yesterday         Latoya Moselle MD  [facility] 09/12/2023, 11:10 AM  Recent Labs  Lab 09/11/23 0531 09/11/23 0622  09/11/23 1041 09/12/23 0417  HGB 7.9*  --   --  7.9*  ALBUMIN  --  1.8*  --   --   CALCIUM 8.2*  --   --  8.7*  PHOS  --   --  4.4 5.0*  CREATININE 4.40*  --   --  4.95*  K 3.4*  --   --  4.2   No results for input(s): "IRON", "TIBC", "FERRITIN" in the last 168 hours. Inpatient medications:  aspirin EC  81 mg Oral Daily   atorvastatin  80 mg Oral Q breakfast   calcitRIOL  0.75 mcg Oral Q T,Th,Sat-1800   calcium acetate  1,334 mg Oral TID WC   Chlorhexidine Gluconate Cloth  6 each Topical Daily   Chlorhexidine Gluconate Cloth  6 each Topical Q0600   clopidogrel  75 mg Oral Daily   famotidine  20 mg Oral BID   levothyroxine  75 mcg Oral Q0600   midodrine  10 mg Oral TID WC   mupirocin ointment  1 Application Nasal BID   oxidized cellulose  1 each Topical Once    cefTRIAXone (ROCEPHIN)  IV Stopped (09/11/23 1749)   epinephrine 3 mcg/min (09/12/23 0900)   heparin 900 Units/hr (09/12/23 0900)   docusate sodium, mouth rinse, polyethylene glycol

## 2023-09-12 NOTE — Progress Notes (Addendum)
Advanced Heart Failure Rounding Note  PCP-Cardiologist: None   Subjective:   2023/10/11: paracentesis, -3L  Feels better today. Eating breakfast, family at bedside.   Objective:   Weight Range: 61.8 kg Body mass index is 20.12 kg/m.   Vital Signs:   Temp:  [97.5 F (36.4 C)-99 F (37.2 C)] 97.8 F (36.6 C) (10/31 0400) Pulse Rate:  [86-97] 95 (10/31 0700) Resp:  [13-32] 32 (10/31 0700) BP: (80-120)/(45-70) 120/65 (10/31 0700) SpO2:  [94 %-100 %] 97 % (10/31 0700) Weight:  [61.8 kg] 61.8 kg (10/31 0500) Last BM Date :  (PTA)  Weight change: Filed Weights   11-Oct-2023 0548 11-Oct-2023 0630 09/12/23 0500  Weight: 65.8 kg 64.9 kg 61.8 kg    Intake/Output:   Intake/Output Summary (Last 24 hours) at 09/12/2023 0756 Last data filed at 2023/10/11 1719 Gross per 24 hour  Intake 546.03 ml  Output 3000 ml  Net -2453.97 ml      Physical Exam    General:  elderly appearing.   HEENT: normal Neck: supple. JVD ~ 8 cm. Carotids 2+ bilat; no bruits. No lymphadenopathy or thyromegaly appreciated. Cor: PMI nondisplaced. Regular rate & rhythm. No rubs, gallops. +TR murmur. Lungs: clear Abdomen: taunt, nontender, slightly distended. No hepatosplenomegaly. No bruits or masses. Good bowel sounds. Extremities: no cyanosis, clubbing, rash, edema. L extremity cooler>R. R fem CVC. R arm fistula +bruit/thrill Neuro: alert & oriented x 3, cranial nerves grossly intact. moves all 4 extremities w/o difficulty. Affect pleasant.   Telemetry   NSR 80s-90s (Personally reviewed)    EKG    No new EKG to review  Labs    CBC Recent Labs    Oct 11, 2023 0531 09/12/23 0417  WBC 16.8* 13.4*  HGB 7.9* 7.9*  HCT 27.2* 25.9*  MCV 98.6 95.9  PLT 304 268   Basic Metabolic Panel Recent Labs    78/29/56 0531 10/11/23 1041 09/12/23 0417  NA 137  --  135  K 3.4*  --  4.2  CL 97*  --  99  CO2 23  --  25  GLUCOSE 121*  --  137*  BUN 16  --  22  CREATININE 4.40*  --  4.95*  CALCIUM 8.2*  --   8.7*  MG 1.7  --  1.9  PHOS  --  4.4 5.0*   Liver Function Tests Recent Labs    2023/10/11 0622  AST 73*  ALT 31  ALKPHOS 132*  BILITOT 1.6*  PROT 5.9*  ALBUMIN 1.8*   No results for input(s): "LIPASE", "AMYLASE" in the last 72 hours. Cardiac Enzymes No results for input(s): "CKTOTAL", "CKMB", "CKMBINDEX", "TROPONINI" in the last 72 hours.  BNP: BNP (last 3 results) No results for input(s): "BNP" in the last 8760 hours.  ProBNP (last 3 results) No results for input(s): "PROBNP" in the last 8760 hours.   D-Dimer No results for input(s): "DDIMER" in the last 72 hours. Hemoglobin A1C No results for input(s): "HGBA1C" in the last 72 hours. Fasting Lipid Panel No results for input(s): "CHOL", "HDL", "LDLCALC", "TRIG", "CHOLHDL", "LDLDIRECT" in the last 72 hours. Thyroid Function Tests No results for input(s): "TSH", "T4TOTAL", "T3FREE", "THYROIDAB" in the last 72 hours.  Invalid input(s): "FREET3"  Other results:   Imaging    DG CHEST PORT 1 VIEW  Result Date: 10/11/23 CLINICAL DATA:  [id number], [id number], [id number], ESRD dialysis patient with shock and NSTEMI. EXAM: PORTABLE CHEST 1 VIEW COMPARISON:  Portable chest yesterday at 9:55 p.m. FINDINGS: 5:36 p.m. The  heart is moderately enlarged. Perihilar vascular congestion and mild-to-moderate interstitial edema with a basal gradient are again noted as well as small pleural effusions. Hazy opacities in the mid to lower lung fields are also noted consistent with ground-glass edema, pneumonitis or combination. Stable mediastinum. The aorta is tortuous and calcified. Prominent left main pulmonary artery segment. Slight thoracic levoscoliosis with osteopenia and degenerative changes. No new osseous findings. Right C7 cervical rib. Compare: The interstitial edema has worsened somewhat in the interval. In all other respects no further changes. IMPRESSION: 1. Cardiomegaly with perihilar vascular congestion and mild-to-moderate interstitial  edema with a basal gradient, with slight worsening in the interstitial edema. 2. Hazy opacities in the mid to lower lung fields consistent with ground-glass edema, pneumonitis or combination. 3. Small pleural effusions. 4. Aortic atherosclerosis. 5. Prominent left main pulmonary artery segment. Electronically Signed   By: Almira Bar M.D.   On: 08/14/2023 21:29   ECHOCARDIOGRAM COMPLETE  Result Date: 08/26/2023    ECHOCARDIOGRAM REPORT   Patient Name:   Latoya Lopez Date of Exam: 08/31/2023 Medical Rec #:  [id number]        Height:       69.0 in Accession #:    [id number]       Weight:       143.1 lb Date of Birth:  09/09/1941        BSA:          1.792 m Patient Age:    82 years         BP:           105/60 mmHg Patient Gender: F                HR:           94 bpm. Exam Location:  Inpatient Procedure: 2D Echo, 3D Echo, Cardiac Doppler and Color Doppler Indications:    122-I22.9 Subsequent ST elevation (STEM) and non-ST elevation                 (NSTEMI) myocardial infarction  History:        Patient has no prior history of Echocardiogram examinations.                 CHF, Previous Myocardial Infarction, Aortic Valve Disease; Risk                 Factors:Dyslipidemia and Former Smoker. ESRD. Aortic stenosis.  Sonographer:    Sheralyn Boatman RDCS Referring Phys: [id number] RAHUL P DESAI IMPRESSIONS  1. RV apex and mid wall are akinetic. Mid and apical septum are hypokinetic. Findings could represent acute RV infarction, and the mid to apical inferoseptum are hypokinetic in the LV. There is severe TR with incomplete leaflet coaptation, but this seems to be related to teathered septal leaflet which is due to the regional RV WMA since the TV annulus is not dilated. Right ventricular systolic function is severely reduced. The right ventricular size is severely enlarged. There is moderately elevated pulmonary artery systolic pressure. The estimated right ventricular systolic pressure is 46.6 mmHg.  2. Left ventricular  ejection fraction, by estimation, is 45 to 50%. The left ventricle has mildly decreased function. The left ventricle demonstrates regional wall motion abnormalities (see scoring diagram/findings for description). There is mild concentric left ventricular hypertrophy. Left ventricular diastolic parameters are consistent with Grade II diastolic dysfunction (pseudonormalization).  3. Left atrial size was mildly dilated.  4. Right atrial size was severely dilated.  5. A  small pericardial effusion is present. The pericardial effusion is posterior to the left ventricle.  6. The mitral valve is degenerative. Moderate mitral valve regurgitation. Moderate to severe mitral annular calcification.  7. The tricuspid valve is abnormal. Tricuspid valve regurgitation is severe.  8. Moderate to severe AS is present. Vmax 3.3 m/s, MG 30 mmHG, AVA 1.05 cm2. AoV CW signal is rounded suggestive of severe AS. Moderate AI is present which makes AVA by VTI problematic. Suspect moderate to severe AS is present with moderate AI, combined  severe aortic valvular heart disease is present. The aortic valve is tricuspid. There is moderate calcification of the aortic valve. There is moderate thickening of the aortic valve. Aortic valve regurgitation is moderate. Moderate to severe aortic valve stenosis. Aortic valve area, by VTI measures 1.05 cm. Aortic valve mean gradient measures 30.5 mmHg. Aortic valve Vmax measures 3.35 m/s.  9. The inferior vena cava is dilated in size with <50% respiratory variability, suggesting right atrial pressure of 15 mmHg. FINDINGS  Left Ventricle: Left ventricular ejection fraction, by estimation, is 45 to 50%. The left ventricle has mildly decreased function. The left ventricle demonstrates regional wall motion abnormalities. The left ventricular internal cavity size was normal in size. There is mild concentric left ventricular hypertrophy. Left ventricular diastolic parameters are consistent with Grade II  diastolic dysfunction (pseudonormalization).  LV Wall Scoring: The apical septal segment is hypokinetic. Right Ventricle: RV apex and mid wall are akinetic. Mid and apical septum are hypokinetic. Findings could represent acute RV infarction, and the mid to apical inferoseptum are hypokinetic in the LV. There is severe TR with incomplete leaflet coaptation, but this seems to be related to teathered septal leaflet which is due to the regional RV WMA since the TV annulus is not dilated. The right ventricular size is severely enlarged. No increase in right ventricular wall thickness. Right ventricular systolic  function is severely reduced. There is moderately elevated pulmonary artery systolic pressure. The tricuspid regurgitant velocity is 2.81 m/s, and with an assumed right atrial pressure of 15 mmHg, the estimated right ventricular systolic pressure is 46.6 mmHg. Left Atrium: Left atrial size was mildly dilated. Right Atrium: Right atrial size was severely dilated. Pericardium: A small pericardial effusion is present. The pericardial effusion is posterior to the left ventricle. Mitral Valve: The mitral valve is degenerative in appearance. Moderate to severe mitral annular calcification. Moderate mitral valve regurgitation. MV peak gradient, 9.7 mmHg. The mean mitral valve gradient is 4.0 mmHg. Tricuspid Valve: The tricuspid valve is abnormal. Tricuspid valve regurgitation is severe. No evidence of tricuspid stenosis. The flow in the hepatic veins is reversed during ventricular systole. Aortic Valve: Moderate to severe AS is present. Vmax 3.3 m/s, MG 30 mmHG, AVA 1.05 cm2. AoV CW signal is rounded suggestive of severe AS. Moderate AI is present which makes AVA by VTI problematic. Suspect moderate to severe AS is present with moderate AI, combined severe aortic valvular heart disease is present. The aortic valve is tricuspid. There is moderate calcification of the aortic valve. There is moderate thickening of the  aortic valve. Aortic valve regurgitation is moderate. Aortic regurgitation PHT measures 206 msec. Moderate to severe aortic stenosis is present. Aortic valve mean gradient measures 30.5 mmHg. Aortic valve peak gradient measures 45.0 mmHg. Aortic valve area, by VTI measures 1.05 cm. Pulmonic Valve: The pulmonic valve was grossly normal. Pulmonic valve regurgitation is mild. No evidence of pulmonic stenosis. Aorta: The aortic root and ascending aorta are structurally normal, with no  evidence of dilitation. Venous: The inferior vena cava is dilated in size with less than 50% respiratory variability, suggesting right atrial pressure of 15 mmHg. IAS/Shunts: The atrial septum is grossly normal. Additional Comments: Mild ascites is present.  LEFT VENTRICLE PLAX 2D LVIDd:         3.25 cm     Diastology LVIDs:         2.70 cm     LV e' medial:    7.62 cm/s LV PW:         1.15 cm     LV E/e' medial:  17.5 LV IVS:        1.20 cm     LV e' lateral:   7.72 cm/s LVOT diam:     2.00 cm     LV E/e' lateral: 17.3 LV SV:         70 LV SV Index:   39 LVOT Area:     3.14 cm  LV Volumes (MOD) LV vol d, MOD A2C: 50.5 ml LV vol d, MOD A4C: 59.9 ml LV vol s, MOD A2C: 29.2 ml LV vol s, MOD A4C: 32.1 ml LV SV MOD A2C:     21.3 ml LV SV MOD A4C:     59.9 ml LV SV MOD BP:      25.9 ml RIGHT VENTRICLE            IVC RV S prime:     8.59 cm/s  IVC diam: 2.40 cm TAPSE (M-mode): 1.9 cm LEFT ATRIUM             Index        RIGHT ATRIUM           Index LA diam:        4.00 cm 2.23 cm/m   RA Area:     23.50 cm LA Vol (A2C):   47.3 ml 26.40 ml/m  RA Volume:   82.40 ml  45.98 ml/m LA Vol (A4C):   48.5 ml 27.07 ml/m LA Biplane Vol: 49.6 ml 27.68 ml/m  AORTIC VALVE                     PULMONIC VALVE AV Area (Vmax):    1.30 cm      PR End Diast Vel: 1.76 msec AV Area (Vmean):   1.10 cm AV Area (VTI):     1.05 cm AV Vmax:           335.33 cm/s AV Vmean:          262.500 cm/s AV VTI:            0.663 m AV Peak Grad:      45.0 mmHg AV Mean Grad:       30.5 mmHg LVOT Vmax:         139.00 cm/s LVOT Vmean:        91.600 cm/s LVOT VTI:          0.222 m LVOT/AV VTI ratio: 0.33 AI PHT:            206 msec  AORTA Ao Root diam: 2.80 cm Ao Asc diam:  3.10 cm MITRAL VALVE                TRICUSPID VALVE MV Area (PHT): 4.01 cm     TR Peak grad:   31.6 mmHg MV Peak grad:  9.7 mmHg     TR Vmax:        281.00  cm/s MV Mean grad:  4.0 mmHg MV Vmax:       1.56 m/s     SHUNTS MV Vmean:      88.8 cm/s    Systemic VTI:  0.22 m MV Decel Time: 189 msec     Systemic Diam: 2.00 cm MV E velocity: 133.67 cm/s MV A velocity: 89.47 cm/s MV E/A ratio:  1.49 Lennie Odor MD Electronically signed by Lennie Odor MD Signature Date/Time: 10-03-23/11:35:01 AM    Final      Medications:     Scheduled Medications:  aspirin EC  81 mg Oral Daily   atorvastatin  80 mg Oral Q breakfast   calcitRIOL  0.75 mcg Oral Q T,Th,Sat-1800   calcium acetate  1,334 mg Oral TID WC   Chlorhexidine Gluconate Cloth  6 each Topical Daily   Chlorhexidine Gluconate Cloth  6 each Topical Q0600   clopidogrel  75 mg Oral Daily   famotidine  20 mg Oral BID   levothyroxine  75 mcg Oral Q0600   mupirocin ointment  1 Application Nasal BID   oxidized cellulose  1 each Topical Once    Infusions:  albumin human 25 g (09/12/23 0414)   cefTRIAXone (ROCEPHIN)  IV 2 g (Oct 03, 2023 1719)   epinephrine 4 mcg/min (09/12/23 0650)   heparin 800 Units/hr (2023/10/03 2234)    PRN Medications: docusate sodium, mouth rinse, polyethylene glycol    Patient Profile   Latoya Lopez is a 82 year old with history of ESRD (HD Tue/Thr/Sat), HTN, hypothyroidism, anemia, HLD,  ascites, and PAD. Had Paracentesis Spetmeber 2024. Followed by [facility] and recently having bilateral lower extremity pain. ABI with mild-mod disease demonstrating tibial disease.  Had an outpatient angiogram with ballon angioplasty on 09/04/23.    Admitted with shock possible cardiogenic/septic  Assessment/Plan   1. Shock  - Cardiogenic vs  septic. Echo LVEF 40-45% akinetic RV + Severe RV failure.  - WBC 16.8. Lactic acid 1.7. Check blood cultures (abx started).  - Continue ceftriaxone - Had Paracentesis in September. Complaining of abdominal pain. S/p paracentesis 10/03/2023 (-3L) - Continue epi 4 mcg, wean as tolerated - Start midodrine 10 mg TID today   2. NSTEMI  - HS Trop 5621>3086. EKG- no ST changes identified. On heparin drip.  - Cath discussed but deferred for now given risk/benefit. Continue heparin drip.  - denies CP - Continue ASA and statin   3. Acute Hypoxic Respiratory Failure  - Hypoxic on arrival. Improved with oxygen.    4. PAD -09/04/23 S/P Aortogram balloon angioplasty R distal superficial femoral artery.  - On aspirin, plavix, atorvastatin   5. ESRD -Had HD 09/10/23.  -Nephrology consulted, holding iHD   6.Hypothyroidism  7. GOC - palliative care team following  Length of Stay: 1  Alen Bleacher, NP  09/12/2023, 7:56 AM  Advanced Heart Failure Team Pager [phone] (M-F; 7a - 5p)  Please contact [facility] for night-coverage after hours (5p -7a ) and weekends on [email or web address]

## 2023-09-12 NOTE — IPAL (Signed)
  Interdisciplinary Goals of Care Family Meeting   Date carried out: 09/12/2023  Location of the meeting: Bedside  Member's involved: Nurse Practitioner, Bedside Registered Nurse, and Family Member or next of kin  Durable Power of Attorney or acting medical decision maker: Shared among family     Discussion: Notified by RN that patient has made the request to transition to comfort care with the statement of "I am ready to go home" at this time we will focus on opening visitation to allow for family to visit with transition to comfort care after.    Code status: Full DNR  Disposition: In-patient comfort care   Time spent for the meeting: 38 min  Usha Slager D. Harris 09/12/2023, 2:20 PM

## 2023-09-13 LAB — PATHOLOGIST SMEAR REVIEW

## 2023-09-13 DEATH — deceased

## 2023-09-14 LAB — BODY FLUID CULTURE W GRAM STAIN
Culture: NO GROWTH
Gram Stain: NONE SEEN

## 2023-10-13 NOTE — Significant Event (Signed)
Lenda Kelp, RN and Cory Roughen, RN pronounced time of death at [phone]. Family at bedside.

## 2023-10-13 NOTE — Death Summary Note (Signed)
DEATH SUMMARY   Patient Details  Name: Latoya Lopez MRN: [id number] DOB: December 08, 1940  Admission/Discharge Information   Admit Date:  09/15/23  Date of Death: Date of Death: September 17, 2023  Time of Death: Time of Death: 0430  Length of Stay: 2  Referring Physician: Patient, No Pcp Per   Reason(s) for Hospitalization  Acute NSTEMI Acute biventricular HFrEF with cardiogenic shock Acute respiratory failure with hypoxia, improving Moderate to severe ascites, SBP was ruled out End-stage renal disease on hemodialysis Severe peripheral arterial disease Anemia of chronic disease Hypokalemia  Diagnoses  Preliminary cause of death: Withdrawal of care in the setting of multisystem organ failure, went on comfort care Secondary Diagnoses (including complications and co-morbidities):  Principal Problem:   NSTEMI (non-ST elevated myocardial infarction) [facility])   Brief Hospital Course (including significant findings, care, treatment, and services provided and events leading to death)  Latoya Lopez is a 82 y.o. year old female who presented to [facility] ED 10/29 with left groin pain after she had a aortogram with LLE angiogram and balloon angioplasty of L superficial femoral artery 1 week prior (09/04/23) for critical bilateral limb ischemia. She has had no external bleeding or bruising following the procedure.   On EMS arrival, she was hypoxic to the 80s with improvement to 90s after being placed on 4L O2. Workup in ED suggestive of NSTEMI and presumed cardiogenic shock. She was started on Heparin. She remained hypotensive after IVF's and was started on Epinephrine infusion. She had a right femoral CVL placed at [facility].   She was later transferred to [facility] for ongoing workup and management  After transfer to [facility], ICU patient was treated for acute NSTEMI with aspirin, statin and IV heparin infusion, cardiology was consulted, recommended medical management.  Echocardiogram was done which  showed biventricular HFrEF and patient was continued to require epinephrine due to cardiogenic shock.  Patient was noted to have ascites, as she was complaining of abdominal pain, paracentesis was done, SBP was ruled out.  Patient was started on SBP prophylaxis.  Unfortunately as she had multiple organ failures including end-stage renal disease and severe peripheral arterial disease, goals of care discussions were carried with patient and her family, due to reversible multiorgan system failure, patient's family decided to proceed with comfort care, she was started on comfort focused care and she passed on 2023/09/17 at 4:30 AM.  Patient's family was informed   Pertinent Labs and Studies  Significant Diagnostic Studies DG CHEST PORT 1 VIEW  Result Date: Sep 15, 2023 CLINICAL DATA:  [id number], [id number], [id number], ESRD dialysis patient with shock and NSTEMI. EXAM: PORTABLE CHEST 1 VIEW COMPARISON:  Portable chest yesterday at 9:55 p.m. FINDINGS: 5:36 p.m. The heart is moderately enlarged. Perihilar vascular congestion and mild-to-moderate interstitial edema with a basal gradient are again noted as well as small pleural effusions. Hazy opacities in the mid to lower lung fields are also noted consistent with ground-glass edema, pneumonitis or combination. Stable mediastinum. The aorta is tortuous and calcified. Prominent left main pulmonary artery segment. Slight thoracic levoscoliosis with osteopenia and degenerative changes. No new osseous findings. Right C7 cervical rib. Compare: The interstitial edema has worsened somewhat in the interval. In all other respects no further changes. IMPRESSION: 1. Cardiomegaly with perihilar vascular congestion and mild-to-moderate interstitial edema with a basal gradient, with slight worsening in the interstitial edema. 2. Hazy opacities in the mid to lower lung fields consistent with ground-glass edema, pneumonitis or combination. 3. Small pleural effusions. 4. Aortic atherosclerosis.  5. Prominent  left main pulmonary artery segment. Electronically Signed   By: Almira Bar M.D.   On: 08/21/2023 21:29   ECHOCARDIOGRAM COMPLETE  Result Date: 08/21/2023    ECHOCARDIOGRAM REPORT   Patient Name:   Latoya Lopez Date of Exam: 08/22/2023 Medical Rec #:  [id number]        Height:       69.0 in Accession #:    [id number]       Weight:       143.1 lb Date of Birth:  December 25, 1940        BSA:          1.792 m Patient Age:    82 years         BP:           105/60 mmHg Patient Gender: F                HR:           94 bpm. Exam Location:  Inpatient Procedure: 2D Echo, 3D Echo, Cardiac Doppler and Color Doppler Indications:    122-I22.9 Subsequent ST elevation (STEM) and non-ST elevation                 (NSTEMI) myocardial infarction  History:        Patient has no prior history of Echocardiogram examinations.                 CHF, Previous Myocardial Infarction, Aortic Valve Disease; Risk                 Factors:Dyslipidemia and Former Smoker. ESRD. Aortic stenosis.  Sonographer:    Sheralyn Boatman RDCS Referring Phys: [id number] RAHUL P DESAI IMPRESSIONS  1. RV apex and mid wall are akinetic. Mid and apical septum are hypokinetic. Findings could represent acute RV infarction, and the mid to apical inferoseptum are hypokinetic in the LV. There is severe TR with incomplete leaflet coaptation, but this seems to be related to teathered septal leaflet which is due to the regional RV WMA since the TV annulus is not dilated. Right ventricular systolic function is severely reduced. The right ventricular size is severely enlarged. There is moderately elevated pulmonary artery systolic pressure. The estimated right ventricular systolic pressure is 46.6 mmHg.  2. Left ventricular ejection fraction, by estimation, is 45 to 50%. The left ventricle has mildly decreased function. The left ventricle demonstrates regional wall motion abnormalities (see scoring diagram/findings for description). There is mild concentric left  ventricular hypertrophy. Left ventricular diastolic parameters are consistent with Grade II diastolic dysfunction (pseudonormalization).  3. Left atrial size was mildly dilated.  4. Right atrial size was severely dilated.  5. A small pericardial effusion is present. The pericardial effusion is posterior to the left ventricle.  6. The mitral valve is degenerative. Moderate mitral valve regurgitation. Moderate to severe mitral annular calcification.  7. The tricuspid valve is abnormal. Tricuspid valve regurgitation is severe.  8. Moderate to severe AS is present. Vmax 3.3 m/s, MG 30 mmHG, AVA 1.05 cm2. AoV CW signal is rounded suggestive of severe AS. Moderate AI is present which makes AVA by VTI problematic. Suspect moderate to severe AS is present with moderate AI, combined  severe aortic valvular heart disease is present. The aortic valve is tricuspid. There is moderate calcification of the aortic valve. There is moderate thickening of the aortic valve. Aortic valve regurgitation is moderate. Moderate to severe aortic valve stenosis. Aortic valve area, by VTI measures 1.05 cm. Aortic  valve mean gradient measures 30.5 mmHg. Aortic valve Vmax measures 3.35 m/s.  9. The inferior vena cava is dilated in size with <50% respiratory variability, suggesting right atrial pressure of 15 mmHg. FINDINGS  Left Ventricle: Left ventricular ejection fraction, by estimation, is 45 to 50%. The left ventricle has mildly decreased function. The left ventricle demonstrates regional wall motion abnormalities. The left ventricular internal cavity size was normal in size. There is mild concentric left ventricular hypertrophy. Left ventricular diastolic parameters are consistent with Grade II diastolic dysfunction (pseudonormalization).  LV Wall Scoring: The apical septal segment is hypokinetic. Right Ventricle: RV apex and mid wall are akinetic. Mid and apical septum are hypokinetic. Findings could represent acute RV infarction, and the  mid to apical inferoseptum are hypokinetic in the LV. There is severe TR with incomplete leaflet coaptation, but this seems to be related to teathered septal leaflet which is due to the regional RV WMA since the TV annulus is not dilated. The right ventricular size is severely enlarged. No increase in right ventricular wall thickness. Right ventricular systolic  function is severely reduced. There is moderately elevated pulmonary artery systolic pressure. The tricuspid regurgitant velocity is 2.81 m/s, and with an assumed right atrial pressure of 15 mmHg, the estimated right ventricular systolic pressure is 46.6 mmHg. Left Atrium: Left atrial size was mildly dilated. Right Atrium: Right atrial size was severely dilated. Pericardium: A small pericardial effusion is present. The pericardial effusion is posterior to the left ventricle. Mitral Valve: The mitral valve is degenerative in appearance. Moderate to severe mitral annular calcification. Moderate mitral valve regurgitation. MV peak gradient, 9.7 mmHg. The mean mitral valve gradient is 4.0 mmHg. Tricuspid Valve: The tricuspid valve is abnormal. Tricuspid valve regurgitation is severe. No evidence of tricuspid stenosis. The flow in the hepatic veins is reversed during ventricular systole. Aortic Valve: Moderate to severe AS is present. Vmax 3.3 m/s, MG 30 mmHG, AVA 1.05 cm2. AoV CW signal is rounded suggestive of severe AS. Moderate AI is present which makes AVA by VTI problematic. Suspect moderate to severe AS is present with moderate AI, combined severe aortic valvular heart disease is present. The aortic valve is tricuspid. There is moderate calcification of the aortic valve. There is moderate thickening of the aortic valve. Aortic valve regurgitation is moderate. Aortic regurgitation PHT measures 206 msec. Moderate to severe aortic stenosis is present. Aortic valve mean gradient measures 30.5 mmHg. Aortic valve peak gradient measures 45.0 mmHg. Aortic valve  area, by VTI measures 1.05 cm. Pulmonic Valve: The pulmonic valve was grossly normal. Pulmonic valve regurgitation is mild. No evidence of pulmonic stenosis. Aorta: The aortic root and ascending aorta are structurally normal, with no evidence of dilitation. Venous: The inferior vena cava is dilated in size with less than 50% respiratory variability, suggesting right atrial pressure of 15 mmHg. IAS/Shunts: The atrial septum is grossly normal. Additional Comments: Mild ascites is present.  LEFT VENTRICLE PLAX 2D LVIDd:         3.25 cm     Diastology LVIDs:         2.70 cm     LV e' medial:    7.62 cm/s LV PW:         1.15 cm     LV E/e' medial:  17.5 LV IVS:        1.20 cm     LV e' lateral:   7.72 cm/s LVOT diam:     2.00 cm     LV E/e'  lateral: 17.3 LV SV:         70 LV SV Index:   39 LVOT Area:     3.14 cm  LV Volumes (MOD) LV vol d, MOD A2C: 50.5 ml LV vol d, MOD A4C: 59.9 ml LV vol s, MOD A2C: 29.2 ml LV vol s, MOD A4C: 32.1 ml LV SV MOD A2C:     21.3 ml LV SV MOD A4C:     59.9 ml LV SV MOD BP:      25.9 ml RIGHT VENTRICLE            IVC RV S prime:     8.59 cm/s  IVC diam: 2.40 cm TAPSE (M-mode): 1.9 cm LEFT ATRIUM             Index        RIGHT ATRIUM           Index LA diam:        4.00 cm 2.23 cm/m   RA Area:     23.50 cm LA Vol (A2C):   47.3 ml 26.40 ml/m  RA Volume:   82.40 ml  45.98 ml/m LA Vol (A4C):   48.5 ml 27.07 ml/m LA Biplane Vol: 49.6 ml 27.68 ml/m  AORTIC VALVE                     PULMONIC VALVE AV Area (Vmax):    1.30 cm      PR End Diast Vel: 1.76 msec AV Area (Vmean):   1.10 cm AV Area (VTI):     1.05 cm AV Vmax:           335.33 cm/s AV Vmean:          262.500 cm/s AV VTI:            0.663 m AV Peak Grad:      45.0 mmHg AV Mean Grad:      30.5 mmHg LVOT Vmax:         139.00 cm/s LVOT Vmean:        91.600 cm/s LVOT VTI:          0.222 m LVOT/AV VTI ratio: 0.33 AI PHT:            206 msec  AORTA Ao Root diam: 2.80 cm Ao Asc diam:  3.10 cm MITRAL VALVE                TRICUSPID VALVE MV  Area (PHT): 4.01 cm     TR Peak grad:   31.6 mmHg MV Peak grad:  9.7 mmHg     TR Vmax:        281.00 cm/s MV Mean grad:  4.0 mmHg MV Vmax:       1.56 m/s     SHUNTS MV Vmean:      88.8 cm/s    Systemic VTI:  0.22 m MV Decel Time: 189 msec     Systemic Diam: 2.00 cm MV E velocity: 133.67 cm/s MV A velocity: 89.47 cm/s MV E/A ratio:  1.49 Lennie Odor MD Electronically signed by Lennie Odor MD Signature Date/Time: 08/18/2023/11:35:01 AM    Final    PERIPHERAL VASCULAR CATHETERIZATION  Result Date: 09/04/2023 Images from the original result were not included. Patient name: Latoya Lopez MRN: [id number] DOB: Aug 21, 1941 Sex: female 09/04/2023 Pre-operative Diagnosis: Bilateral lower extremity critical limb ischemia with rest pain Post-operative diagnosis:  Same Surgeon:  Victorino Sparrow, MD Procedure Performed: 1.  Ultrasound-guided micropuncture access of the left common femoral artery in retrograde fashion 2.  Aortogram 3.  Second-order cannulation, left lower extremity angiogram 4.  Third order cannulation, left lower extremity angiogram 5.  Drug-coated balloon angioplasty 4x59mm superficial femoral artery 6.  Device is to closure-Mynx Indications: Patient is an 82 year old female with history of bilateral lower extremity pain.  ABIs appeared to demonstrate mild to moderate disease, with bilateral lower extremity duplex demonstrating tibial disease.  On physical exam she had a nonpalpable pulse.  Being that she continues to have rest pain in bilateral lower extremities, we discussed angiography of bilateral lower extremities to rule out peripheral arterial disease as a culprit.  After discussing risk and benefits of angiogram with possible intervention, [name] elected to proceed. Findings: Aorta: Widely patent infrarenal aorta, no flow-limiting stenosis in the aortoiliac segments bilaterally On the right: Widely patent common femoral artery, profunda, superficial femoral artery patent with focal area of 75%  stenosis.  Patent profunda.  Two-vessel peroneal, posterior tibial outflow to the foot filling plantar branches in the foot. On the left: Widely patent common femoral artery, frontal, superficial femoral artery, popliteal artery.  High takeoff of the anterior tibial artery which became atretic and occluded.  Two-vessel runoff to the level of the foot via the peroneal and posterior tibial arteries which continued to the foot via plantar arteries.  Procedure:  The patient was identified in the holding area and taken to room 8.  The patient was then placed supine on the table and prepped and draped in the usual sterile fashion.  A time out was called.  Ultrasound was used to evaluate the left common femoral artery.  It was patent .  A digital ultrasound image was acquired.  A micropuncture needle was used to access the left common femoral artery under ultrasound guidance.  An 018 wire was advanced without resistance and a micropuncture sheath was placed.  The 018 wire was removed and a benson wire was placed.  The micropuncture sheath was exchanged for a 5 french sheath.  An omniflush catheter was advanced over the wire to the level of L-1.  An abdominal angiogram was obtained.  The catheter was then pulled down to the aortic bifurcation and bilateral lower extremity angiogram followed.  There was poor opacification below the level of the knee, therefore using the omniflush catheter and a benson wire, the aortic bifurcation was crossed and the catheter was placed into theright external iliac artery and right runoff was obtained.  Runoff was difficult to assess, therefore the flush catheter was then positioned into the superficial femoral artery for continued runoff of the right lower extremity.  Intervention followed.  Prior to closure, the remainder of the left lower extremity was assessed through retrograde injections from the sheath.  See results above. I elected to intervene on the 75% focal stenosis appreciated in  the distal superficial femoral artery.  A 6 x 60 cm sheath was brought to field and parked in the proximal superficial femoral artery.  The patient was heparinized.  A series of wires and catheters were used to cross the lesion, and a 4 x 40 mm balloon was brought onto the field.  This was inflated across the lesion.  Follow-up imaging demonstrated significant improvement however there was some luminal irregularity.  I elected to reinflated balloon across the lesion, and she was using drug-coated balloon for angioplasty.  The drug-coated balloon was sized at 4 x 40 mm.  This was inflated for 3 minutes.  Follow-up angiography demonstrated excellent result with resolution of flow-limiting stenosis.  The sheath was pulled back and to be followed of the remainder of the left lower extremity.  See results above.  The left-sided percutaneous access site was closed using a minx device without issue.  Impression: Successful drug-coated balloon angioplasty of 75% stenosis in the right distal superficial femoral artery.  Patient with two-vessel runoff to the foot bilaterally.  I expect to the right lower extremity pain to improve, but being that she has mild amount of left lower extremity rest pain, I think there is another etiology as well.  Victorino Sparrow MD [facility] Office: [phone]    Microbiology Recent Results (from the past 240 hour(s))  MRSA Next Gen by PCR, Nasal     Status: Abnormal   Collection Time: 08/29/2023  5:31 AM   Specimen: Nasal Mucosa; Nasal Swab  Result Value Ref Range Status   MRSA by PCR Next Gen DETECTED (A) NOT DETECTED Final    Comment: CRITICAL RESULT CALLED TO, READ BACK BY AND VERIFIED WITH: RN Haigler Kitten [id number] [phone] BY J RAZZAK,MT (NOTE) The GeneXpert MRSA Assay (FDA approved for NASAL specimens only), is one component of a comprehensive MRSA colonization surveillance program. It is not intended to diagnose MRSA infection nor to guide or  monitor treatment for MRSA infections. Test performance is not FDA approved in patients less than 91 years old. Performed at [facility], [address] [place], [place] [address]   Peritoneal fluid culture w Gram Stain     Status: None (Preliminary result)   Collection Time: 08/25/2023  4:32 PM   Specimen: Peritoneal Washings; Peritoneal Fluid  Result Value Ref Range Status   Specimen Description PERITONEAL  Final   Special Requests NONE  Final   Gram Stain NO WBC SEEN NO ORGANISMS SEEN   Final   Culture   Final    NO GROWTH < 24 HOURS Performed at [facility], [address] [place], [place] [address]    Report Status PENDING  Incomplete    Lab Basic Metabolic Panel: Recent Labs  Lab 09/02/2023 0531 09/07/2023 1041 09/12/23 0417  NA 137  --  135  K 3.4*  --  4.2  CL 97*  --  99  CO2 23  --  25  GLUCOSE 121*  --  137*  BUN 16  --  22  CREATININE 4.40*  --  4.95*  CALCIUM 8.2*  --  8.7*  MG 1.7  --  1.9  PHOS  --  4.4 5.0*   Liver Function Tests: Recent Labs  Lab 08/22/2023 0622  AST 73*  ALT 31  ALKPHOS 132*  BILITOT 1.6*  PROT 5.9*  ALBUMIN 1.8*   No results for input(s): "LIPASE", "AMYLASE" in the last 168 hours. No results for input(s): "AMMONIA" in the last 168 hours. CBC: Recent Labs  Lab 09/09/2023 0531 09/12/23 0417  WBC 16.8* 13.4*  HGB 7.9* 7.9*  HCT 27.2* 25.9*  MCV 98.6 95.9  PLT 304 268   Cardiac Enzymes: No results for input(s): "CKTOTAL", "CKMB", "CKMBINDEX", "TROPONINI" in the last 168 hours. Sepsis Labs: Recent Labs  Lab 08/23/2023 0531 09/08/2023 1437 09/12/23 0417  WBC 16.8*  --  13.4*  LATICACIDVEN  --  1.7  --     Procedures/Operations     [employer] 09/27/2023, 10:52 AM

## 2023-10-13 DEATH — deceased

## 2023-10-17 ENCOUNTER — Encounter (HOSPITAL_COMMUNITY): Payer: Medicare Other

## 2023-10-17 ENCOUNTER — Encounter: Payer: Medicare Other | Admitting: Vascular Surgery

## 2023-10-18 ENCOUNTER — Encounter (HOSPITAL_COMMUNITY): Payer: Medicare Other

## 2024-02-17 ENCOUNTER — Ambulatory Visit: Payer: Medicare Other
